# Patient Record
Sex: Male | Born: 1970 | Race: White | Hispanic: No | Marital: Single
Health system: Southern US, Community
[De-identification: ages and names within clinical notes are randomized; demographics above are authoritative.]

## PROBLEM LIST (undated history)

## (undated) DIAGNOSIS — J9621 Acute and chronic respiratory failure with hypoxia: Secondary | ICD-10-CM

## (undated) DIAGNOSIS — I2699 Other pulmonary embolism without acute cor pulmonale: Secondary | ICD-10-CM

## (undated) DIAGNOSIS — J189 Pneumonia, unspecified organism: Secondary | ICD-10-CM

## (undated) DIAGNOSIS — G373 Acute transverse myelitis in demyelinating disease of central nervous system: Secondary | ICD-10-CM

## (undated) HISTORY — PX: TRACHEOSTOMY: SUR1362

---

## 2018-08-27 ENCOUNTER — Other Ambulatory Visit (HOSPITAL_COMMUNITY): Payer: Self-pay

## 2018-08-27 ENCOUNTER — Inpatient Hospital Stay
Admission: AD | Admit: 2018-08-27 | Discharge: 2018-10-19 | Disposition: A | Discharge status: Other Acute Inpatient Hospital | Payer: 59 | Source: Other Acute Inpatient Hospital | Attending: Internal Medicine | Admitting: Internal Medicine

## 2018-08-27 DIAGNOSIS — Z0000 Encounter for general adult medical examination without abnormal findings: Secondary | ICD-10-CM

## 2018-08-27 DIAGNOSIS — Z931 Gastrostomy status: Secondary | ICD-10-CM

## 2018-08-27 DIAGNOSIS — R6510 Systemic inflammatory response syndrome (SIRS) of non-infectious origin without acute organ dysfunction: Secondary | ICD-10-CM

## 2018-08-27 DIAGNOSIS — J9690 Respiratory failure, unspecified, unspecified whether with hypoxia or hypercapnia: Secondary | ICD-10-CM

## 2018-08-27 DIAGNOSIS — J9621 Acute and chronic respiratory failure with hypoxia: Secondary | ICD-10-CM | POA: Diagnosis present

## 2018-08-27 DIAGNOSIS — Z4659 Encounter for fitting and adjustment of other gastrointestinal appliance and device: Secondary | ICD-10-CM

## 2018-08-27 DIAGNOSIS — J189 Pneumonia, unspecified organism: Secondary | ICD-10-CM

## 2018-08-27 DIAGNOSIS — R0902 Hypoxemia: Secondary | ICD-10-CM

## 2018-08-27 DIAGNOSIS — M869 Osteomyelitis, unspecified: Secondary | ICD-10-CM

## 2018-08-27 DIAGNOSIS — Z430 Encounter for attention to tracheostomy: Secondary | ICD-10-CM

## 2018-08-27 DIAGNOSIS — I2699 Other pulmonary embolism without acute cor pulmonale: Secondary | ICD-10-CM | POA: Diagnosis present

## 2018-08-27 DIAGNOSIS — G373 Acute transverse myelitis in demyelinating disease of central nervous system: Secondary | ICD-10-CM | POA: Diagnosis present

## 2018-08-27 HISTORY — DX: Acute transverse myelitis in demyelinating disease of central nervous system: G37.3

## 2018-08-27 HISTORY — DX: Acute and chronic respiratory failure with hypoxia: J96.21

## 2018-08-27 HISTORY — DX: Other pulmonary embolism without acute cor pulmonale: I26.99

## 2018-08-27 HISTORY — DX: Pneumonia, unspecified organism: J18.9

## 2018-08-27 LAB — HEPARIN LEVEL (UNFRACTIONATED): Heparin Unfractionated: <0.1 IU/mL — ABNORMAL LOW (ref 0.30–0.70)

## 2018-08-28 LAB — COMPREHENSIVE METABOLIC PANEL
ALT: 58 U/L — ABNORMAL HIGH (ref 0–44)
AST: 37 U/L (ref 15–41)
Albumin: 2.9 g/dL — ABNORMAL LOW (ref 3.5–5.0)
Alkaline Phosphatase: 219 U/L — ABNORMAL HIGH (ref 38–126)
Anion gap: 12 (ref 5–15)
BUN: 18 mg/dL (ref 6–20)
CO2: 33 mmol/L — ABNORMAL HIGH (ref 22–32)
Calcium: 9.7 mg/dL (ref 8.9–10.3)
Chloride: 93 mmol/L — ABNORMAL LOW (ref 98–111)
Creatinine, Ser: 0.52 mg/dL — ABNORMAL LOW (ref 0.61–1.24)
GFR calc Af Amer: >60 mL/min (ref >60)
GFR calc non Af Amer: >60 mL/min (ref >60)
Glucose, Bld: 98 mg/dL (ref 70–99)
Potassium: 4.1 mmol/L (ref 3.5–5.1)
Sodium: 138 mmol/L (ref 135–145)
Total Bilirubin: 0.6 mg/dL (ref 0.3–1.2)
Total Protein: 7.2 g/dL (ref 6.5–8.1)

## 2018-08-28 LAB — PROTIME-INR
INR: 1.1 (ref 0.8–1.2)
Prothrombin Time: 14.4 seconds (ref 11.4–15.2)

## 2018-08-28 LAB — CBC
HCT: 25.8 % — ABNORMAL LOW (ref 39.0–52.0)
Hemoglobin: 7.7 g/dL — ABNORMAL LOW (ref 13.0–17.0)
MCH: 27.2 pg (ref 26.0–34.0)
MCHC: 29.8 g/dL — ABNORMAL LOW (ref 30.0–36.0)
MCV: 91.2 fL (ref 80.0–100.0)
Platelets: 339 10*3/uL (ref 150–400)
RBC: 2.83 MIL/uL — ABNORMAL LOW (ref 4.22–5.81)
RDW: 16.4 % — ABNORMAL HIGH (ref 11.5–15.5)
WBC: 12.4 10*3/uL — ABNORMAL HIGH (ref 4.0–10.5)
nRBC: 0 % (ref 0.0–0.2)

## 2018-08-28 LAB — HEPARIN LEVEL (UNFRACTIONATED)
Heparin Unfractionated: <0.1 IU/mL — ABNORMAL LOW (ref 0.30–0.70)
Heparin Unfractionated: <0.1 IU/mL — ABNORMAL LOW (ref 0.30–0.70)
Heparin Unfractionated: 0.42 IU/mL (ref 0.30–0.70)

## 2018-08-29 LAB — HEPARIN LEVEL (UNFRACTIONATED)
Heparin Unfractionated: 0.2 IU/mL — ABNORMAL LOW (ref 0.30–0.70)
Heparin Unfractionated: 0.17 IU/mL — ABNORMAL LOW (ref 0.30–0.70)
Heparin Unfractionated: <0.1 IU/mL — ABNORMAL LOW (ref 0.30–0.70)

## 2018-08-29 LAB — PROTIME-INR
INR: 1 (ref 0.8–1.2)
Prothrombin Time: 13.5 seconds (ref 11.4–15.2)

## 2018-08-30 DIAGNOSIS — J189 Pneumonia, unspecified organism: Secondary | ICD-10-CM

## 2018-08-30 DIAGNOSIS — I2699 Other pulmonary embolism without acute cor pulmonale: Secondary | ICD-10-CM | POA: Diagnosis not present

## 2018-08-30 DIAGNOSIS — G373 Acute transverse myelitis in demyelinating disease of central nervous system: Secondary | ICD-10-CM

## 2018-08-30 DIAGNOSIS — J9621 Acute and chronic respiratory failure with hypoxia: Secondary | ICD-10-CM

## 2018-08-30 DIAGNOSIS — Z430 Encounter for attention to tracheostomy: Secondary | ICD-10-CM

## 2018-08-30 LAB — PROTIME-INR
INR: 1.2 (ref 0.8–1.2)
Prothrombin Time: 15.4 seconds — ABNORMAL HIGH (ref 11.4–15.2)

## 2018-08-30 MED ORDER — CHLOROPHYLL EX
10.00 | CUTANEOUS | Status: DC
Start: ? — End: 2018-08-30

## 2018-08-30 MED ORDER — Medication
1.00 | Status: DC
Start: 2018-08-28 — End: 2018-08-30

## 2018-08-30 MED ORDER — OXYCODONE HCL 5 MG PO TABS
10.00 | ORAL_TABLET | ORAL | Status: DC
Start: 2018-08-27 — End: 2018-08-30

## 2018-08-30 MED ORDER — IPECAC PO
5.00 | ORAL | Status: DC
Start: ? — End: 2018-08-30

## 2018-08-30 MED ORDER — RA PAIN RELIEVER PM PO
ORAL | Status: DC
Start: 2018-08-27 — End: 2018-08-30

## 2018-08-30 MED ORDER — PHENYLEPHRINE-GUAIFENESIN 20-375 MG PO CP12
20.00 | ORAL_CAPSULE | ORAL | Status: DC
Start: ? — End: 2018-08-30

## 2018-08-30 MED ORDER — ALBA-3 EX
400.00 | CUTANEOUS | Status: DC
Start: 2018-08-27 — End: 2018-08-30

## 2018-08-30 MED ORDER — BARIJODEEL PO
20.00 | ORAL | Status: DC
Start: 2018-08-28 — End: 2018-08-30

## 2018-08-30 MED ORDER — BISACODYL 10 MG RE SUPP
10.00 | RECTAL | Status: DC
Start: ? — End: 2018-08-30

## 2018-08-30 MED ORDER — FML OP
1000.00 | OPHTHALMIC | Status: DC
Start: 2018-08-28 — End: 2018-08-30

## 2018-08-30 MED ORDER — MYLANTA ULTRA 700-300 MG PO CHEW
3.00 | CHEWABLE_TABLET | ORAL | Status: DC
Start: 2018-08-27 — End: 2018-08-30

## 2018-08-30 MED ORDER — GENERIC EXTERNAL MEDICATION
Status: DC
Start: ? — End: 2018-08-30

## 2018-08-30 MED ORDER — CHLORHEXIDINE GLUCONATE 0.12 % MT SOLN
15.00 | OROMUCOSAL | Status: DC
Start: ? — End: 2018-08-30

## 2018-08-30 MED ORDER — CVS BATH OIL EX
0.50 | CUTANEOUS | Status: DC
Start: ? — End: 2018-08-30

## 2018-08-30 MED ORDER — CALCIUM CARBONATE ANTACID PO
2.00 | ORAL | Status: DC
Start: 2018-08-27 — End: 2018-08-30

## 2018-08-30 MED ORDER — EQUATE NICOTINE 4 MG MT GUM
4.00 | CHEWING_GUM | OROMUCOSAL | Status: DC
Start: ? — End: 2018-08-30

## 2018-08-30 MED ORDER — BARIJODEEL PO
20.00 | ORAL | Status: DC
Start: ? — End: 2018-08-30

## 2018-08-30 MED ORDER — ACCU-PRO PUMP SET/VENT MISC
2.50 | Status: DC
Start: ? — End: 2018-08-30

## 2018-08-30 MED ORDER — TRIAMINIC COUGH/SORE THROAT PO
15.00 | ORAL | Status: DC
Start: 2018-08-27 — End: 2018-08-30

## 2018-08-30 MED ORDER — PCCA BIOPEPTIDE BASE EX CREA
1200.00 | TOPICAL_CREAM | CUTANEOUS | Status: DC
Start: 2018-08-27 — End: 2018-08-30

## 2018-08-30 MED ORDER — MSUD COOLER PO
10.00 | ORAL | Status: DC
Start: 2018-08-28 — End: 2018-08-30

## 2018-08-30 MED ORDER — Medication
15.00 | Status: DC
Start: 2018-08-27 — End: 2018-08-30

## 2018-08-30 MED ORDER — PRO HERBS ENERGY PO TABS
20.00 | ORAL_TABLET | ORAL | Status: DC
Start: 2018-08-27 — End: 2018-08-30

## 2018-08-30 MED ORDER — OLANZAPINE-FLUOXETINE HCL 6-50 MG PO CAPS
6.00 | ORAL_CAPSULE | ORAL | Status: DC
Start: 2018-08-27 — End: 2018-08-30

## 2018-08-30 MED ORDER — ACETAMINOPHEN 325 MG PO TABS
650.00 | ORAL_TABLET | ORAL | Status: DC
Start: ? — End: 2018-08-30

## 2018-08-30 MED ORDER — Medication
3.00 | Status: DC
Start: 2018-08-27 — End: 2018-08-30

## 2018-08-30 NOTE — Consult Note (Signed)
Pulmonary [place]  PULMONARY SERVICE  Date of Service: 08/30/2018  PULMONARY CRITICAL CARE Benjamin Montoya  EVO:[id number]  DOB: 28-Mar-1970   DOA: 08/27/2018  Referring Physician: Merton Border, MD  HPI: Benjamin Montoya is a 48 y.o. male seen for follow up of Acute on Chronic Respiratory Failure.  Patient was apparently admitted with transverse myelitis was started on IVIG and also given steroids.  Will complications with development of pulmonary embolism and respiratory failure.  Patient had increased PCO2 was intubated however had to end up then on ECMO.  Patient also did develop volume associated healthcare associated pneumonia was probably due to aspiration.  He was treated aggressively with antibiotics and had been on the ventilator and not able to come off of the ventilator so therefore required prolonged mechanical ventilation and transferred to our facility for further management and weaning.  Review of Systems:  ROS performed and is unremarkable other than noted above.  Past medical history: Respiratory failure Pulmonary embolism Pneumonia Transverse myelitis  Past surgical history: Tracheostomy PEG tube  Socioeconomic History  . Marital status: Single  Spouse name: None  . Number of children: None  . Years of education: None  . Highest education level: None  Occupational History  . None  Social Needs  . Financial resource strain: None  . Food insecurity  Worry: None  Inability: None  . Transportation needs  Medical: None  Non-medical: None  Tobacco Use  . Smoking status: Current Every Day Smoker  Packs/day: 0.50  Years: 20.00  Pack years: 10.00  Types: Cigarettes  . Smokeless tobacco: Never Used  Substance and Sexual Activity  . Alcohol use: Not Currently  . Drug use: Yes  Types: Marijuana  . Sexual activity: None  Lifestyle  . Physical activity  Days per week: None  Minutes per session: None  .  Stress: None  Relationships  . Social Medical illustrator on phone: None  Gets together: None  Attends religious service: None  Active member of club or organization: None  Attends meetings of clubs or organizations: None  Relationship status: None  Other Topics Concern  . None  Social History Narrative  . None      Medications: Reviewed on Rounds  Physical Exam:  Vitals: Temperature 98.1 pulse 89 respiratory rate 25 blood pressure 130/67 saturations 97%  Ventilator Settings mode ventilation pressure support FiO2 45% tidal volume 683 pressure support 12 PEEP 8  . General: Comfortable at this time . Eyes: Grossly normal lids, irises & conjunctiva . ENT: grossly tongue is normal . Neck: no obvious mass . Cardiovascular: S1-S2 normal no gallop or rub . Respiratory: No rhonchi or rales . Abdomen: Soft and nontender . Skin: no rash seen on limited exam . Musculoskeletal: not rigid . Psychiatric:unable to assess . Neurologic: no seizure no involuntary movements         Labs on Admission:  Basic Metabolic Panel: Recent Labs  Lab 08/28/18 0232  NA 138  K 4.1  CL 93*  CO2 33*  GLUCOSE 98  BUN 18  CREATININE 0.52*  CALCIUM 9.7    No results for input(s): PHART, PCO2ART, PO2ART, HCO3, O2SAT in the last 168 hours.  Liver Function Tests: Recent Labs  Lab 08/28/18 0232  AST 37  ALT 58*  ALKPHOS 219*  BILITOT 0.6  PROT 7.2  ALBUMIN 2.9*   No results for input(s): LIPASE, AMYLASE in the last 168 hours. No results for input(s): AMMONIA in  the last 168 hours.  CBC: Recent Labs  Lab 08/28/18 0232  WBC 12.4*  HGB 7.7*  HCT 25.8*  MCV 91.2  PLT 339    Cardiac Enzymes: No results for input(s): CKTOTAL, CKMB, CKMBINDEX, TROPONINI in the last 168 hours.  BNP (last 3 results) No results for input(s): BNP in the last 8760 hours.  ProBNP (last 3 results) No results for input(s): PROBNP in the last 8760 hours.   Radiological Exams on Admission: Dg Chest  Port 1 View  Result Date: 08/27/2018 CLINICAL DATA:  Tube placement EXAM: PORTABLE CHEST 1 VIEW COMPARISON:  None. FINDINGS: The tracheostomy tube terminates above the carina. The right-sided PICC line tip terminates over the SVC. The enteric tube extends below the left hemidiaphragm. The cardiac silhouette is borderline enlarged. There are small to moderate-sized bilateral pleural effusions. There is a dense retrocardiac opacity. There is no evidence of a pneumothorax. IMPRESSION: 1. Lines and tubes as above. 2. Small to moderate-sized bilateral pleural effusions. 3. Dense retrocardiac opacity which may represent atelectasis or infiltrate. Electronically Signed   By: Katherine Mantlehristopher  Green M.D.   On: 08/27/2018 22:16   Dg Abd Portable 1v  Result Date: 08/27/2018 CLINICAL DATA:  Encounter for nasogastric tube placement. EXAM: PORTABLE ABDOMEN - 1 VIEW COMPARISON:  None. FINDINGS: Nasogastric tube or feeding tube is been inserted, and has passed through the stomach, with the tip near the ligament of Treitz. IMPRESSION: Satisfactory nasogastric tube placement. Electronically Signed   By: Elsie StainJohn T Curnes M.D.   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 Acute on chronic respiratory failure with hypoxia we will continue with full support on pressure support at this time.  The goal is for 4-hour wean on the pressure support.  Patient looks very good should be able to actually do more than 4 hours if he is able to tolerate 2. Pulmonary embolism treated with anticoagulation continue with supportive care follow along. 3. Healthcare associated pneumonia likely was due to aspiration patient has been treated with antibiotics we will continue with supportive care. 4. Transverse myelitis status  post IVIG we will continue to monitor  I have personally seen and evaluated the patient, evaluated laboratory and imaging results, formulated the assessment and plan and placed orders. The Patient requires high complexity decision making for assessment and support.  Case was discussed on Rounds with the Respiratory Therapy Staff Time Spent 70minutes  Yevonne PaxSaadat A , MD [facility] Pulmonary Critical Care Medicine Sleep Medicine

## 2018-08-31 ENCOUNTER — Other Ambulatory Visit (HOSPITAL_COMMUNITY): Payer: Self-pay

## 2018-08-31 DIAGNOSIS — J9621 Acute and chronic respiratory failure with hypoxia: Secondary | ICD-10-CM | POA: Diagnosis not present

## 2018-08-31 DIAGNOSIS — G373 Acute transverse myelitis in demyelinating disease of central nervous system: Secondary | ICD-10-CM | POA: Diagnosis not present

## 2018-08-31 DIAGNOSIS — J189 Pneumonia, unspecified organism: Secondary | ICD-10-CM | POA: Diagnosis not present

## 2018-08-31 DIAGNOSIS — I2699 Other pulmonary embolism without acute cor pulmonale: Secondary | ICD-10-CM | POA: Diagnosis not present

## 2018-08-31 LAB — CBC
HCT: 26.9 % — ABNORMAL LOW (ref 39.0–52.0)
Hemoglobin: 8.1 g/dL — ABNORMAL LOW (ref 13.0–17.0)
MCH: 27.2 pg (ref 26.0–34.0)
MCHC: 30.1 g/dL (ref 30.0–36.0)
MCV: 90.3 fL (ref 80.0–100.0)
Platelets: 370 10*3/uL (ref 150–400)
RBC: 2.98 MIL/uL — ABNORMAL LOW (ref 4.22–5.81)
RDW: 16.1 % — ABNORMAL HIGH (ref 11.5–15.5)
WBC: 9.1 10*3/uL (ref 4.0–10.5)
nRBC: 0 % (ref 0.0–0.2)

## 2018-08-31 LAB — BASIC METABOLIC PANEL
Anion gap: 12 (ref 5–15)
BUN: 14 mg/dL (ref 6–20)
CO2: 29 mmol/L (ref 22–32)
Calcium: 10 mg/dL (ref 8.9–10.3)
Chloride: 96 mmol/L — ABNORMAL LOW (ref 98–111)
Creatinine, Ser: 0.39 mg/dL — ABNORMAL LOW (ref 0.61–1.24)
GFR calc Af Amer: >60 mL/min (ref >60)
GFR calc non Af Amer: >60 mL/min (ref >60)
Glucose, Bld: 98 mg/dL (ref 70–99)
Potassium: 4.1 mmol/L (ref 3.5–5.1)
Sodium: 137 mmol/L (ref 135–145)

## 2018-08-31 LAB — PROTIME-INR
INR: 1.3 — ABNORMAL HIGH (ref 0.8–1.2)
Prothrombin Time: 15.7 seconds — ABNORMAL HIGH (ref 11.4–15.2)

## 2018-08-31 NOTE — Progress Notes (Signed)
Pulmonary Critical Care Medicine [place]   PULMONARY CRITICAL CARE SERVICE  PROGRESS NOTE  Date of Service: 08/31/2018  Benjamin Montoya  ASN:[id number]  DOB: [birth year]   DOA: 08/27/2018  Referring Physician: Merton Border, MD  HPI: Benjamin Montoya is a 48 y.o. male seen for follow up of Acute on Chronic Respiratory Failure.  Currently patient is on pressure support mode has been on 40% FiO2 the goal today is for about 8 hours  Medications: Reviewed on Rounds  Physical Exam:  Vitals: Temperature 99.1 pulse 70 respiratory rate 20 blood pressure 100/53 saturations 95%  Ventilator Settings mode ventilation pressure support FiO2 40% pressure 12 PEEP 5  . General: Comfortable at this time . Eyes: Grossly normal lids, irises & conjunctiva . ENT: grossly tongue is normal . Neck: no obvious mass . Cardiovascular: S1 S2 normal no gallop . Respiratory: No rhonchi or rales are noted at this time . Abdomen: soft . Skin: no rash seen on limited exam . Musculoskeletal: not rigid . Psychiatric:unable to assess . Neurologic: no seizure no involuntary movements         Lab Data:   Basic Metabolic Panel: Recent Labs  Lab 08/28/18 0232 08/31/18 0503  NA 138 137  K 4.1 4.1  CL 93* 96*  CO2 33* 29  GLUCOSE 98 98  BUN 18 14  CREATININE 0.52* 0.39*  CALCIUM 9.7 10.0    ABG: No results for input(s): PHART, PCO2ART, PO2ART, HCO3, O2SAT in the last 168 hours.  Liver Function Tests: Recent Labs  Lab 08/28/18 0232  AST 37  ALT 58*  ALKPHOS 219*  BILITOT 0.6  PROT 7.2  ALBUMIN 2.9*   No results for input(s): LIPASE, AMYLASE in the last 168 hours. No results for input(s): AMMONIA in the last 168 hours.  CBC: Recent Labs  Lab 08/28/18 0232 08/31/18 0503  WBC 12.4* 9.1  HGB 7.7* 8.1*  HCT 25.8* 26.9*  MCV 91.2 90.3  PLT 339 370    Cardiac Enzymes: No results for input(s): CKTOTAL, CKMB, CKMBINDEX, TROPONINI in the last 168 hours.  BNP (last 3  results) No results for input(s): BNP in the last 8760 hours.  ProBNP (last 3 results) No results for input(s): PROBNP in the last 8760 hours.  Radiological Exams: No results found.  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 Acute on chronic respiratory failure with hypoxia we will continue with pressure support titrate oxygen down as tolerated the goal is for 8 hours we will continue to advance as per the protocol 2. Acute transverse myelitis we will continue with therapy as tolerated patient prognosis guarded unknown etiology 3. Healthcare associated pneumonia treated we will continue with supportive care follow radiologically 4. Pulmonary embolism continue with present management.   I have personally seen and evaluated the patient, evaluated laboratory and imaging results, formulated the assessment and plan and placed orders. The Patient requires high complexity decision making for assessment and support.  Case was discussed on Rounds with the Respiratory Therapy Staff  Allyne Gee, MD [facility] Pulmonary Critical Care Medicine Sleep Medicine

## 2018-09-01 DIAGNOSIS — I2699 Other pulmonary embolism without acute cor pulmonale: Secondary | ICD-10-CM | POA: Diagnosis not present

## 2018-09-01 DIAGNOSIS — J9621 Acute and chronic respiratory failure with hypoxia: Secondary | ICD-10-CM | POA: Diagnosis not present

## 2018-09-01 DIAGNOSIS — J189 Pneumonia, unspecified organism: Secondary | ICD-10-CM | POA: Diagnosis not present

## 2018-09-01 DIAGNOSIS — G373 Acute transverse myelitis in demyelinating disease of central nervous system: Secondary | ICD-10-CM | POA: Diagnosis not present

## 2018-09-01 LAB — PROTIME-INR
INR: 1.4 — ABNORMAL HIGH (ref 0.8–1.2)
Prothrombin Time: 17 seconds — ABNORMAL HIGH (ref 11.4–15.2)

## 2018-09-01 NOTE — Progress Notes (Addendum)
Pulmonary Critical Care Medicine [place]   PULMONARY CRITICAL CARE SERVICE  PROGRESS NOTE  Date of Service: 09/01/2018  Benjamin Montoya  AYT:[id number]  DOB: Jan 07, 1971   DOA: 08/27/2018  Referring Physician: Merton Border, MD  HPI: Benjamin Montoya is a 48 y.o. male seen for follow up of Acute on Chronic Respiratory Failure.  Patient at goal of 12 hours today on pressure support 12 over 5 with an FiO2 of 35%.    Medications: Reviewed on Rounds  Physical Exam:  Vitals: Pulse 76 respirations 20 BP 106/61 O2 sat 92% temp 98.8  Ventilator Settings pressure support 12/5 FiO2 35%  . General: Comfortable at this time . Eyes: Grossly normal lids, irises & conjunctiva . ENT: grossly tongue is normal . Neck: no obvious mass . Cardiovascular: S1 S2 normal no gallop . Respiratory: No rales or rhonchi noted . Abdomen: soft . Skin: no rash seen on limited exam . Musculoskeletal: not rigid . Psychiatric:unable to assess . Neurologic: no seizure no involuntary movements         Lab Data:   Basic Metabolic Panel: Recent Labs  Lab 08/28/18 0232 08/31/18 0503  NA 138 137  K 4.1 4.1  CL 93* 96*  CO2 33* 29  GLUCOSE 98 98  BUN 18 14  CREATININE 0.52* 0.39*  CALCIUM 9.7 10.0    ABG: No results for input(s): PHART, PCO2ART, PO2ART, HCO3, O2SAT in the last 168 hours.  Liver Function Tests: Recent Labs  Lab 08/28/18 0232  AST 37  ALT 58*  ALKPHOS 219*  BILITOT 0.6  PROT 7.2  ALBUMIN 2.9*   No results for input(s): LIPASE, AMYLASE in the last 168 hours. No results for input(s): AMMONIA in the last 168 hours.  CBC: Recent Labs  Lab 08/28/18 0232 08/31/18 0503  WBC 12.4* 9.1  HGB 7.7* 8.1*  HCT 25.8* 26.9*  MCV 91.2 90.3  PLT 339 370    Cardiac Enzymes: No results for input(s): CKTOTAL, CKMB, CKMBINDEX, TROPONINI in the last 168 hours.  BNP (last 3 results) No results for input(s): BNP in the last 8760 hours.  ProBNP (last 3 results) No  results for input(s): PROBNP in the last 8760 hours.  Radiological Exams: Dg Chest Port 1 View  Result Date: 08/31/2018 CLINICAL DATA:  Pneumonia. EXAM: PORTABLE CHEST 1 VIEW COMPARISON:  08/27/2018 FINDINGS: The tracheostomy tube is stable. The right PICC line is also stable. There is a feeding tube coursing down the esophagus and into the stomach. Persistent bilateral pleural effusions and bibasilar infiltrates, right greater than left. The cardiac silhouette, mediastinal and hilar contours are unchanged. Mild vascular congestion. IMPRESSION: 1. Stable support apparatus. 2. Persistent bilateral effusions and bibasilar infiltrates. Electronically Signed   By: Marijo Sanes M.D.   On: 08/31/2018 09:58    Assessment/Plan Active Problems:   * No active hospital problems. *   1. Acute on chronic respiratory failure with hypoxia we will continue with pressure support titrate oxygen down as tolerated the goal is for 12 hours we will continue to advance as per the protocol 2. Acute transverse myelitis we will continue with therapy as tolerated patient prognosis guarded unknown etiology 3. Healthcare associated pneumonia treated we will continue with supportive care follow radiologically 4. Pulmonary embolism continue with present management.   I have personally seen and evaluated the patient, evaluated laboratory and imaging results, formulated the assessment and plan and placed orders. The Patient requires high complexity decision making for assessment and support.  Case was  discussed on Rounds with the Respiratory Therapy Staff  Allyne Gee, MD [facility] Pulmonary Critical Care Medicine Sleep Medicine

## 2018-09-02 ENCOUNTER — Encounter: Payer: Self-pay | Admitting: Internal Medicine

## 2018-09-02 DIAGNOSIS — J9621 Acute and chronic respiratory failure with hypoxia: Secondary | ICD-10-CM | POA: Diagnosis not present

## 2018-09-02 DIAGNOSIS — G373 Acute transverse myelitis in demyelinating disease of central nervous system: Secondary | ICD-10-CM | POA: Diagnosis not present

## 2018-09-02 DIAGNOSIS — I2699 Other pulmonary embolism without acute cor pulmonale: Secondary | ICD-10-CM | POA: Diagnosis not present

## 2018-09-02 DIAGNOSIS — J189 Pneumonia, unspecified organism: Secondary | ICD-10-CM | POA: Diagnosis present

## 2018-09-02 LAB — PROTIME-INR
INR: 1.6 — ABNORMAL HIGH (ref 0.8–1.2)
Prothrombin Time: 18.9 seconds — ABNORMAL HIGH (ref 11.4–15.2)

## 2018-09-02 NOTE — Progress Notes (Signed)
Pulmonary Critical Care Medicine [place]   PULMONARY CRITICAL CARE SERVICE  PROGRESS NOTE  Date of Service: 09/02/2018  Benjamin Montoya  YTK:[id number]  DOB: 1970-06-29   DOA: 08/27/2018  Referring Physician: Merton Border, MD  HPI: Benjamin Montoya is a 48 y.o. male seen for follow up of Acute on Chronic Respiratory Failure.  Patient currently is on pressure support mode did 12 hours yesterday the goal is to do about 16 hours today.  Medications: Reviewed on Rounds  Physical Exam:  Vitals: Temperature 98.2 pulse 80 respiratory 18 blood pressure 140 15/60 saturations 96%  Ventilator Settings mode ventilation pressure support FiO2 30% pressure support 12 PEEP 5  . General: Comfortable at this time . Eyes: Grossly normal lids, irises & conjunctiva . ENT: grossly tongue is normal . Neck: no obvious mass . Cardiovascular: S1 S2 normal no gallop . Respiratory: No rhonchi coarse breath sounds . Abdomen: soft . Skin: no rash seen on limited exam . Musculoskeletal: not rigid . Psychiatric:unable to assess . Neurologic: no seizure no involuntary movements         Lab Data:   Basic Metabolic Panel: Recent Labs  Lab 08/28/18 0232 08/31/18 0503  NA 138 137  K 4.1 4.1  CL 93* 96*  CO2 33* 29  GLUCOSE 98 98  BUN 18 14  CREATININE 0.52* 0.39*  CALCIUM 9.7 10.0    ABG: No results for input(s): PHART, PCO2ART, PO2ART, HCO3, O2SAT in the last 168 hours.  Liver Function Tests: Recent Labs  Lab 08/28/18 0232  AST 37  ALT 58*  ALKPHOS 219*  BILITOT 0.6  PROT 7.2  ALBUMIN 2.9*   No results for input(s): LIPASE, AMYLASE in the last 168 hours. No results for input(s): AMMONIA in the last 168 hours.  CBC: Recent Labs  Lab 08/28/18 0232 08/31/18 0503  WBC 12.4* 9.1  HGB 7.7* 8.1*  HCT 25.8* 26.9*  MCV 91.2 90.3  PLT 339 370    Cardiac Enzymes: No results for input(s): CKTOTAL, CKMB, CKMBINDEX, TROPONINI in the last 168 hours.  BNP (last 3  results) No results for input(s): BNP in the last 8760 hours.  ProBNP (last 3 results) No results for input(s): PROBNP in the last 8760 hours.  Radiological Exams: No results found.  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 Acute on chronic respiratory failure with hypoxia we will continue to wean on pressure support patient is able to complete 12 hours goal of 16 hours today continue to advance 2. Acute transverse myelitis patient is at baseline continue with supportive care 3. Healthcare associated pneumonia treated clinically improving we will continue to monitor. 4. Pulmonary embolism at baseline continue with supportive care   I have personally seen and evaluated the patient, evaluated laboratory and imaging results, formulated the assessment and plan and placed orders. The Patient requires high complexity decision making for assessment and support.  Case was discussed on Rounds with the Respiratory Therapy Staff  Allyne Gee, MD [facility] Pulmonary Critical Care Medicine Sleep Medicine

## 2018-09-03 DIAGNOSIS — I2699 Other pulmonary embolism without acute cor pulmonale: Secondary | ICD-10-CM | POA: Diagnosis not present

## 2018-09-03 DIAGNOSIS — J9621 Acute and chronic respiratory failure with hypoxia: Secondary | ICD-10-CM | POA: Diagnosis not present

## 2018-09-03 DIAGNOSIS — G373 Acute transverse myelitis in demyelinating disease of central nervous system: Secondary | ICD-10-CM | POA: Diagnosis not present

## 2018-09-03 DIAGNOSIS — J189 Pneumonia, unspecified organism: Secondary | ICD-10-CM | POA: Diagnosis not present

## 2018-09-03 LAB — PROTIME-INR
INR: 1.6 — ABNORMAL HIGH (ref 0.8–1.2)
Prothrombin Time: 18.9 seconds — ABNORMAL HIGH (ref 11.4–15.2)

## 2018-09-03 NOTE — Progress Notes (Addendum)
Pulmonary Critical Care Medicine [place]   PULMONARY CRITICAL CARE SERVICE  PROGRESS NOTE  Date of Service: 09/03/2018  Quincy Boy  EPP:[id number]  DOB: 11-11-1970   DOA: 08/27/2018  Referring Physician: Merton Border, MD  HPI: Kolin Erdahl is a 48 y.o. male seen for follow up of Acute on Chronic Respiratory Failure.  Patient has a pressure support goal of 20 hours today on 35% FiO2.  Satting well at this time with no distress.  Medications: Reviewed on Rounds  Physical Exam:  Vitals: Pulse 66 respirations 20 BP 111/59 O2 sat 95% temp 98.9  Ventilator Settings pressure support 12/5 FiO2 of 35%  . General: Comfortable at this time . Eyes: Grossly normal lids, irises & conjunctiva . ENT: grossly tongue is normal . Neck: no obvious mass . Cardiovascular: S1 S2 normal no gallop . Respiratory: No rales or rhonchi noted . Abdomen: soft . Skin: no rash seen on limited exam . Musculoskeletal: not rigid . Psychiatric:unable to assess . Neurologic: no seizure no involuntary movements         Lab Data:   Basic Metabolic Panel: Recent Labs  Lab 08/28/18 0232 08/31/18 0503  NA 138 137  K 4.1 4.1  CL 93* 96*  CO2 33* 29  GLUCOSE 98 98  BUN 18 14  CREATININE 0.52* 0.39*  CALCIUM 9.7 10.0    ABG: No results for input(s): PHART, PCO2ART, PO2ART, HCO3, O2SAT in the last 168 hours.  Liver Function Tests: Recent Labs  Lab 08/28/18 0232  AST 37  ALT 58*  ALKPHOS 219*  BILITOT 0.6  PROT 7.2  ALBUMIN 2.9*   No results for input(s): LIPASE, AMYLASE in the last 168 hours. No results for input(s): AMMONIA in the last 168 hours.  CBC: Recent Labs  Lab 08/28/18 0232 08/31/18 0503  WBC 12.4* 9.1  HGB 7.7* 8.1*  HCT 25.8* 26.9*  MCV 91.2 90.3  PLT 339 370    Cardiac Enzymes: No results for input(s): CKTOTAL, CKMB, CKMBINDEX, TROPONINI in the last 168 hours.  BNP (last 3 results) No results for input(s): BNP in the last 8760  hours.  ProBNP (last 3 results) No results for input(s): PROBNP in the last 8760 hours.  Radiological Exams: No results found.  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 Acute on respiratory failure with hypoxia continue to wean on pressure support with a goal of 20 hours at this time.  Continue supportive measures and pulmonary toilet. 2. Acute transverse myelitis patient at baseline continue supportive care 3. Healthcare associated pneumonia treated clinically improving continue to monitor 4. Pulmonary embolism at baseline continue supportive care   I have personally seen and evaluated the patient, evaluated laboratory and imaging results, formulated the assessment and plan and placed orders. The Patient requires high complexity decision making for assessment and support.  Case was discussed on Rounds with the Respiratory Therapy Staff  Allyne Gee, MD [facility] Pulmonary Critical Care Medicine Sleep Medicine

## 2018-09-04 DIAGNOSIS — G373 Acute transverse myelitis in demyelinating disease of central nervous system: Secondary | ICD-10-CM | POA: Diagnosis not present

## 2018-09-04 DIAGNOSIS — J189 Pneumonia, unspecified organism: Secondary | ICD-10-CM | POA: Diagnosis not present

## 2018-09-04 DIAGNOSIS — I2699 Other pulmonary embolism without acute cor pulmonale: Secondary | ICD-10-CM | POA: Diagnosis not present

## 2018-09-04 DIAGNOSIS — J9621 Acute and chronic respiratory failure with hypoxia: Secondary | ICD-10-CM | POA: Diagnosis not present

## 2018-09-04 LAB — PROTIME-INR
INR: 1.9 — ABNORMAL HIGH (ref 0.8–1.2)
Prothrombin Time: 21.1 seconds — ABNORMAL HIGH (ref 11.4–15.2)

## 2018-09-04 NOTE — Progress Notes (Addendum)
Pulmonary Critical Care Medicine [place]   PULMONARY CRITICAL CARE SERVICE  PROGRESS NOTE  Date of Service: 09/04/2018  Benjamin Montoya  CVE:[id number]  DOB: 1970/11/19   DOA: 08/27/2018  Referring Physician: Merton Border, MD  HPI: Benjamin Montoya is a 48 y.o. male seen for follow up of Acute on Chronic Respiratory Failure.  Patient was able to 2 hours on aerosol trach collar today and is now having 14 hours on pressure support 12/5 with FiO2 of 35%.  Satting well with no distress.  Medications: Reviewed on Rounds  Physical Exam:  Vitals: Pulse 9 respirations 22 BP 109/78 O2 sat 95% temp 97.9  Ventilator Settings pressure support 12/5 FiO2 35%  . General: Comfortable at this time . Eyes: Grossly normal lids, irises & conjunctiva . ENT: grossly tongue is normal . Neck: no obvious mass . Cardiovascular: S1 S2 normal no gallop . Respiratory: No rales or rhonchi noted . Abdomen: soft . Skin: no rash seen on limited exam . Musculoskeletal: not rigid . Psychiatric:unable to assess . Neurologic: no seizure no involuntary movements         Lab Data:   Basic Metabolic Panel: Recent Labs  Lab 08/31/18 0503  NA 137  K 4.1  CL 96*  CO2 29  GLUCOSE 98  BUN 14  CREATININE 0.39*  CALCIUM 10.0    ABG: No results for input(s): PHART, PCO2ART, PO2ART, HCO3, O2SAT in the last 168 hours.  Liver Function Tests: No results for input(s): AST, ALT, ALKPHOS, BILITOT, PROT, ALBUMIN in the last 168 hours. No results for input(s): LIPASE, AMYLASE in the last 168 hours. No results for input(s): AMMONIA in the last 168 hours.  CBC: Recent Labs  Lab 08/31/18 0503  WBC 9.1  HGB 8.1*  HCT 26.9*  MCV 90.3  PLT 370    Cardiac Enzymes: No results for input(s): CKTOTAL, CKMB, CKMBINDEX, TROPONINI in the last 168 hours.  BNP (last 3 results) No results for input(s): BNP in the last 8760 hours.  ProBNP (last 3 results) No results for input(s): PROBNP in the  last 8760 hours.  Radiological Exams: No results found.  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 Acute on respiratory failure with hypoxia continue to wean per protocol on aerosol trach collar with pressure support for a total of 16 hours.  Continue supportive measures and pulmonary toilet. 2. Acute transverse myelitis patient at baseline continue supportive care 3. Healthcare associated pneumonia treated clinically improving continue to monitor 4. Pulmonary embolism at baseline continue supportive care   I have personally seen and evaluated the patient, evaluated laboratory and imaging results, formulated the assessment and plan and placed orders. The Patient requires high complexity decision making for assessment and support.  Case was discussed on Rounds with the Respiratory Therapy Staff  Allyne Gee, MD [facility] Pulmonary Critical Care Medicine Sleep Medicine

## 2018-09-05 DIAGNOSIS — I2699 Other pulmonary embolism without acute cor pulmonale: Secondary | ICD-10-CM | POA: Diagnosis not present

## 2018-09-05 DIAGNOSIS — J189 Pneumonia, unspecified organism: Secondary | ICD-10-CM | POA: Diagnosis not present

## 2018-09-05 DIAGNOSIS — J9621 Acute and chronic respiratory failure with hypoxia: Secondary | ICD-10-CM | POA: Diagnosis not present

## 2018-09-05 DIAGNOSIS — G373 Acute transverse myelitis in demyelinating disease of central nervous system: Secondary | ICD-10-CM | POA: Diagnosis not present

## 2018-09-05 LAB — PROTIME-INR
INR: 2.3 — ABNORMAL HIGH (ref 0.8–1.2)
Prothrombin Time: 24.6 seconds — ABNORMAL HIGH (ref 11.4–15.2)

## 2018-09-05 LAB — VANCOMYCIN, TROUGH: Vancomycin Tr: <4 ug/mL — ABNORMAL LOW (ref 15–20)

## 2018-09-05 NOTE — Progress Notes (Addendum)
Pulmonary Critical Care Medicine [place]   PULMONARY CRITICAL CARE SERVICE  PROGRESS NOTE  Date of Service: 09/05/2018  Benjamin Montoya  OZH:[id number]  DOB: March 24, 1971   DOA: 08/27/2018  Referring Physician: Merton Border, MD  HPI: Benjamin Montoya is a 48 y.o. male seen for follow up of Acute on Chronic Respiratory Failure.  Patient remains on 35% aerosol trach collar today with a 4 to 6-hour goal and then will switch to pressure support for 16 hours total weaning.  Satting well currently with no distress noted.  Medications: Reviewed on Rounds  Physical Exam:  Vitals: Pulse 71 respirations 20 BP 120/63 O2 sat 95% temp 98.2  Ventilator Settings ATC 35%  . General: Comfortable at this time . Eyes: Grossly normal lids, irises & conjunctiva . ENT: grossly tongue is normal . Neck: no obvious mass . Cardiovascular: S1 S2 normal no gallop . Respiratory: No rales or rhonchi noted . Abdomen: soft . Skin: no rash seen on limited exam . Musculoskeletal: not rigid . Psychiatric:unable to assess . Neurologic: no seizure no involuntary movements         Lab Data:   Basic Metabolic Panel: Recent Labs  Lab 08/31/18 0503  NA 137  K 4.1  CL 96*  CO2 29  GLUCOSE 98  BUN 14  CREATININE 0.39*  CALCIUM 10.0    ABG: No results for input(s): PHART, PCO2ART, PO2ART, HCO3, O2SAT in the last 168 hours.  Liver Function Tests: No results for input(s): AST, ALT, ALKPHOS, BILITOT, PROT, ALBUMIN in the last 168 hours. No results for input(s): LIPASE, AMYLASE in the last 168 hours. No results for input(s): AMMONIA in the last 168 hours.  CBC: Recent Labs  Lab 08/31/18 0503  WBC 9.1  HGB 8.1*  HCT 26.9*  MCV 90.3  PLT 370    Cardiac Enzymes: No results for input(s): CKTOTAL, CKMB, CKMBINDEX, TROPONINI in the last 168 hours.  BNP (last 3 results) No results for input(s): BNP in the last 8760 hours.  ProBNP (last 3 results) No results for input(s): PROBNP  in the last 8760 hours.  Radiological Exams: No results found.  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 Acute on respiratory failure with hypoxia continue to wean per protocol on aerosol trach collar 20% FiO2 for 4 to 6 hours and with pressure support for a total of 16 hours.  Continue supportive measures and pulmonary toilet. 2. Acute transverse myelitis patient at baseline continue supportive care 3. Healthcare associated pneumonia treated clinically improving continue to monitor 4. Pulmonary embolism at baseline continue supportive care   I have personally seen and evaluated the patient, evaluated laboratory and imaging results, formulated the assessment and plan and placed orders. The Patient requires high complexity decision making for assessment and support.  Case was discussed on Rounds with the Respiratory Therapy Staff  Allyne Gee, MD [facility] Pulmonary Critical Care Medicine Sleep Medicine

## 2018-09-06 DIAGNOSIS — J9621 Acute and chronic respiratory failure with hypoxia: Secondary | ICD-10-CM | POA: Diagnosis not present

## 2018-09-06 DIAGNOSIS — G373 Acute transverse myelitis in demyelinating disease of central nervous system: Secondary | ICD-10-CM | POA: Diagnosis not present

## 2018-09-06 DIAGNOSIS — J189 Pneumonia, unspecified organism: Secondary | ICD-10-CM | POA: Diagnosis not present

## 2018-09-06 DIAGNOSIS — I2699 Other pulmonary embolism without acute cor pulmonale: Secondary | ICD-10-CM | POA: Diagnosis not present

## 2018-09-06 LAB — PROTIME-INR
INR: 2 — ABNORMAL HIGH (ref 0.8–1.2)
Prothrombin Time: 22.3 seconds — ABNORMAL HIGH (ref 11.4–15.2)

## 2018-09-06 NOTE — Progress Notes (Addendum)
Pulmonary Critical Care Medicine [place]   PULMONARY CRITICAL CARE SERVICE  PROGRESS NOTE  Date of Service: 09/06/2018  Benjamin Montoya  QIH:[id number]  DOB: 07-28-1970   DOA: 08/27/2018  Referring Physician: Merton Border, MD  HPI: Benjamin Montoya is a 48 y.o. male seen for follow up of Acute on Chronic Respiratory Failure.  Patient was only able to tolerate 3 and half hours of aerosol trach collar 35% today and is now back on pressure support.  Patient reports he is not feeling well he remains slightly febrile at this time.  Medications: Reviewed on Rounds  Physical Exam:  Vitals: Pulse 80 respirations 20 BP 134/69 O2 sat 94% temp 100.2  Ventilator Settings pressure 12/5 FiO2 of 35%  . General: Comfortable at this time . Eyes: Grossly normal lids, irises & conjunctiva . ENT: grossly tongue is normal . Neck: no obvious mass . Cardiovascular: S1 S2 normal no gallop . Respiratory: Rales or rhonchi noted . Abdomen: soft . Skin: no rash seen on limited exam . Musculoskeletal: not rigid . Psychiatric:unable to assess . Neurologic: no seizure no involuntary movements         Lab Data:   Basic Metabolic Panel: Recent Labs  Lab 08/31/18 0503  NA 137  K 4.1  CL 96*  CO2 29  GLUCOSE 98  BUN 14  CREATININE 0.39*  CALCIUM 10.0    ABG: No results for input(s): PHART, PCO2ART, PO2ART, HCO3, O2SAT in the last 168 hours.  Liver Function Tests: No results for input(s): AST, ALT, ALKPHOS, BILITOT, PROT, ALBUMIN in the last 168 hours. No results for input(s): LIPASE, AMYLASE in the last 168 hours. No results for input(s): AMMONIA in the last 168 hours.  CBC: Recent Labs  Lab 08/31/18 0503  WBC 9.1  HGB 8.1*  HCT 26.9*  MCV 90.3  PLT 370    Cardiac Enzymes: No results for input(s): CKTOTAL, CKMB, CKMBINDEX, TROPONINI in the last 168 hours.  BNP (last 3 results) No results for input(s): BNP in the last 8760 hours.  ProBNP (last 3 results) No  results for input(s): PROBNP in the last 8760 hours.  Radiological Exams: No results found.  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 Acute on respiratory failure with hypoxia patient resting on pressure support at this time after failing a wean to aerosol trach collar today.  We will continue to attempt weaning again tomorrow after patient rest. 2. Acute transverse myelitis patient at baseline continue supportive care 3. Healthcare associated pneumonia treated clinically improving continue to monitor 4. Pulmonary embolism at baseline continue supportive care   I have personally seen and evaluated the patient, evaluated laboratory and imaging results, formulated the assessment and plan and placed orders. The Patient requires high complexity decision making for assessment and support.  Case was discussed on Rounds with the Respiratory Therapy Staff  Allyne Gee, MD [facility] Pulmonary Critical Care Medicine Sleep Medicine

## 2018-09-07 ENCOUNTER — Other Ambulatory Visit (HOSPITAL_COMMUNITY): Payer: Self-pay

## 2018-09-07 DIAGNOSIS — J9621 Acute and chronic respiratory failure with hypoxia: Secondary | ICD-10-CM | POA: Diagnosis not present

## 2018-09-07 DIAGNOSIS — I2699 Other pulmonary embolism without acute cor pulmonale: Secondary | ICD-10-CM | POA: Diagnosis not present

## 2018-09-07 DIAGNOSIS — J189 Pneumonia, unspecified organism: Secondary | ICD-10-CM | POA: Diagnosis not present

## 2018-09-07 DIAGNOSIS — G373 Acute transverse myelitis in demyelinating disease of central nervous system: Secondary | ICD-10-CM | POA: Diagnosis not present

## 2018-09-07 LAB — BASIC METABOLIC PANEL
Anion gap: 11 (ref 5–15)
BUN: 15 mg/dL (ref 6–20)
CO2: 28 mmol/L (ref 22–32)
Calcium: 9.5 mg/dL (ref 8.9–10.3)
Chloride: 95 mmol/L — ABNORMAL LOW (ref 98–111)
Creatinine, Ser: 0.54 mg/dL — ABNORMAL LOW (ref 0.61–1.24)
GFR calc Af Amer: >60 mL/min (ref >60)
GFR calc non Af Amer: >60 mL/min (ref >60)
Glucose, Bld: 113 mg/dL — ABNORMAL HIGH (ref 70–99)
Potassium: 4.7 mmol/L (ref 3.5–5.1)
Sodium: 134 mmol/L — ABNORMAL LOW (ref 135–145)

## 2018-09-07 LAB — URINALYSIS, ROUTINE W REFLEX MICROSCOPIC
Bacteria, UA: NONE SEEN
Bilirubin Urine: NEGATIVE
Glucose, UA: NEGATIVE mg/dL
Ketones, ur: NEGATIVE mg/dL
Leukocytes,Ua: NEGATIVE
Nitrite: NEGATIVE
Protein, ur: 100 mg/dL — AB
Specific Gravity, Urine: 1.016 (ref 1.005–1.030)
pH: 6 (ref 5.0–8.0)

## 2018-09-07 LAB — CBC
HCT: 32.4 % — ABNORMAL LOW (ref 39.0–52.0)
Hemoglobin: 9.9 g/dL — ABNORMAL LOW (ref 13.0–17.0)
MCH: 26.8 pg (ref 26.0–34.0)
MCHC: 30.6 g/dL (ref 30.0–36.0)
MCV: 87.8 fL (ref 80.0–100.0)
Platelets: 418 10*3/uL — ABNORMAL HIGH (ref 150–400)
RBC: 3.69 MIL/uL — ABNORMAL LOW (ref 4.22–5.81)
RDW: 15.4 % (ref 11.5–15.5)
WBC: 16.2 10*3/uL — ABNORMAL HIGH (ref 4.0–10.5)
nRBC: 0 % (ref 0.0–0.2)

## 2018-09-07 LAB — PROTIME-INR
INR: 2.5 — ABNORMAL HIGH (ref 0.8–1.2)
Prothrombin Time: 26.4 seconds — ABNORMAL HIGH (ref 11.4–15.2)

## 2018-09-07 NOTE — Progress Notes (Addendum)
Pulmonary Critical Care Medicine [place]   PULMONARY CRITICAL CARE SERVICE  PROGRESS NOTE  Date of Service: 09/07/2018  Benjamin Montoya  GQQ:[id number]  DOB: 1970/05/17   DOA: 08/27/2018  Referring Physician: Merton Border, MD  HPI: Benjamin Montoya is a 48 y.o. male seen for follow up of Acute on Chronic Respiratory Failure.  Patient remains on 35% aerosol trach collar satting well with no distress at this time. He has a goal of 8 hours today.    Medications: Reviewed on Rounds  Physical Exam:  Vitals: Pulse 67 respirations 30 BP 159/68 O2 sat 97% temp 97.6  Ventilator Settings ATC 35%%  . General: Comfortable at this time . Eyes: Grossly normal lids, irises & conjunctiva . ENT: grossly tongue is normal . Neck: no obvious mass . Cardiovascular: S1 S2 normal no gallop . Respiratory: No rales or rhonchi noted . Abdomen: soft . Skin: no rash seen on limited exam . Musculoskeletal: not rigid . Psychiatric:unable to assess . Neurologic: no seizure no involuntary movements         Lab Data:   Basic Metabolic Panel: Recent Labs  Lab 09/07/18 0803  NA 134*  K 4.7  CL 95*  CO2 28  GLUCOSE 113*  BUN 15  CREATININE 0.54*  CALCIUM 9.5    ABG: No results for input(s): PHART, PCO2ART, PO2ART, HCO3, O2SAT in the last 168 hours.  Liver Function Tests: No results for input(s): AST, ALT, ALKPHOS, BILITOT, PROT, ALBUMIN in the last 168 hours. No results for input(s): LIPASE, AMYLASE in the last 168 hours. No results for input(s): AMMONIA in the last 168 hours.  CBC: Recent Labs  Lab 09/07/18 0803  WBC 16.2*  HGB 9.9*  HCT 32.4*  MCV 87.8  PLT 418*    Cardiac Enzymes: No results for input(s): CKTOTAL, CKMB, CKMBINDEX, TROPONINI in the last 168 hours.  BNP (last 3 results) No results for input(s): BNP in the last 8760 hours.  ProBNP (last 3 results) No results for input(s): PROBNP in the last 8760 hours.  Radiological Exams: Ct Abdomen Wo  Contrast  Result Date: 09/07/2018 CLINICAL DATA:  Evaluate anatomy prior to potential percutaneous gastrostomy tube placement. EXAM: CT ABDOMEN WITHOUT CONTRAST TECHNIQUE: Multidetector CT imaging of the abdomen was performed following the standard protocol without IV contrast. COMPARISON:  None. FINDINGS: The lack of intravenous contrast limits the ability to evaluate solid abdominal organs. Lower chest: Limited visualization of the lower thorax demonstrates small right-sided pleural effusion. Bibasilar consolidative opacities with associated air bronchograms, right greater than left. Normal heart size.  No pericardial effusion. Hepatobiliary: Mild nodularity hepatic contour. Normal noncontrast appearance of the gallbladder given degree distention. No radiopaque gallstones. No ascites. Pancreas: Normal noncontrast appearance of the pancreas. Spleen: Normal noncontrast appearance of the spleen. Note is made of a small splenule. Adrenals/Urinary Tract: Normal noncontrast appearance of the bilateral kidneys. No evidence of nephrolithiasis. There is a very minimal amount of grossly symmetric bilateral likely age and body habitus related perinephric stranding. No urinary obstruction. Normal noncontrast appearance the bilateral adrenal glands. The urinary bladder was not imaged. Stomach/Bowel: The anterior wall the stomach is well apposed against the ventral wall of the upper abdomen without interposed transverse colon. Enteric tube terminates within the Benjamin Montoya. No evidence of enteric obstruction. Radiopaque material within the cecum may represent portions of radiopaque pill fragments. No pneumoperitoneum, pneumatosis or portal venous gas. Vascular/Lymphatic: Minimal amount of atherosclerotic plaque within a normal caliber abdominal aorta. No bulky within normal caliber abdominal  aorta retroperitoneal or mesenteric lymphadenopathy. Other: Scattered foci of subcutaneous emphysema about the ventral wall of the abdomen,  likely at the location of subcutaneous medication administration. Note is made of a narrow neck mesenteric fat containing periumbilical hernia measuring at least 4.8 x 2.2 cm. A minimal amount of subcutaneous edema is noted about the bilateral flanks in midline of the back. Musculoskeletal: No acute or aggressive osseous abnormalities. Stigmata of DISH within the thoracic spine. IMPRESSION: 1. Gastric anatomy amenable to potential percutaneous gastrostomy tube placement as indicated. 2. Small right-sided pleural effusion with bibasilar consolidative opacities with associated air bronchograms, right greater than left, atelectasis versus infiltrate. 3.  Aortic Atherosclerosis (ICD10-I70.0). Electronically Signed   By: Simonne ComeJohn  Watts M.D.   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 Acute on respiratory failure with hypoxia patient is doing better today with aerosol trach collar trials.  Satting well.  Continue pulmonary toilet and supportive measures. 2. Acute transverse myelitis patient at baseline continue supportive care 3. Healthcare associated pneumonia treated clinically improving continue to monitor 4. Pulmonary embolism at baseline continue supportive care    I have personally seen and evaluated the patient, evaluated laboratory and imaging results, formulated the assessment and plan and placed orders. The Patient requires high complexity decision making for assessment and support.  Case was discussed on Rounds with the Respiratory Therapy Staff  Yevonne PaxSaadat A , MD [facility] Pulmonary Critical Care Medicine Sleep Medicine

## 2018-09-08 DIAGNOSIS — J189 Pneumonia, unspecified organism: Secondary | ICD-10-CM | POA: Diagnosis not present

## 2018-09-08 DIAGNOSIS — I2699 Other pulmonary embolism without acute cor pulmonale: Secondary | ICD-10-CM | POA: Diagnosis not present

## 2018-09-08 DIAGNOSIS — J9621 Acute and chronic respiratory failure with hypoxia: Secondary | ICD-10-CM | POA: Diagnosis not present

## 2018-09-08 DIAGNOSIS — G373 Acute transverse myelitis in demyelinating disease of central nervous system: Secondary | ICD-10-CM | POA: Diagnosis not present

## 2018-09-08 LAB — PROTIME-INR
INR: 1.9 — ABNORMAL HIGH (ref 0.8–1.2)
Prothrombin Time: 21.9 seconds — ABNORMAL HIGH (ref 11.4–15.2)

## 2018-09-08 LAB — URINE CULTURE: Culture: NO GROWTH

## 2018-09-08 NOTE — Consult Note (Signed)
Chief Complaint: Patient was seen in consultation today for percutaneous gastric tube placement at the request of Dr Mariah Milling  Supervising Physician: Aletta Edouard  Patient Status: [facility] IP  History of Present Illness: Benjamin Montoya is a 48 y.o. 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 Acute on chronic respiratory failure with hypoxia ([place])   . 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 Healthcare-associated pneumonia   . Pulmonary embolism (HCC)     Allergies: Patient has no allergy information on record.  Medications: Prior to Admission medications   Not on File     No family history on file.  Social History   Socioeconomic History  . Marital status: Single    Spouse name: Not on file  . Number of children: Not on file  . Years of education: Not on file  . Highest education level: Not on file  Occupational History  . Not on file  Social Needs  . Financial resource strain: Not on file  . Food insecurity    Worry: Not on file    Inability: Not on file  . Transportation needs    Medical: Not on file    Non-medical: Not on file  Tobacco Use  . Smoking status: Not on file  Substance and Sexual Activity  . Alcohol use: Not on file  . Drug use: Not on file  . Sexual activity: Not on file  Lifestyle  . Physical activity    Days per week: Not on file    Minutes per session: Not on file  . Stress: Not on file  Relationships  . Social Herbalist on phone: Not on file    Gets together: Not on file    Attends religious service:  Not on file    Active member of club or organization: Not on file    Attends meetings of clubs or organizations: Not on file    Relationship status: Not on file  Other Topics Concern  . Not on file  Social History Narrative  . Not on file    Review of Systems: A 12 point ROS discussed and pertinent positives are indicated in the HPI above.  All other systems are negative.  Review of Systems  Constitutional: Positive for activity change, appetite change and unexpected weight change. Negative for fatigue and fever.  Respiratory: Negative for cough.   Gastrointestinal: Negative for abdominal pain.  Neurological: Positive for weakness.  Psychiatric/Behavioral: Negative for behavioral problems.    Vital Signs: There were no vitals taken for this visit.  Physical Exam Vitals signs reviewed.  HENT:     Mouth/Throat:     Mouth: Mucous membranes are moist.  Cardiovascular:     Rate and Rhythm: Normal rate and regular rhythm.     Heart sounds: Normal heart sounds.  Pulmonary:     Breath sounds: Normal breath sounds.     Comments: vent Abdominal:     Palpations: Abdomen is soft.  Musculoskeletal:     Comments: Transverse myelitis Little to no movement of extremities  Skin:    General: Skin is warm and  dry.  Neurological:     Mental Status: He is alert.  Psychiatric:     Comments: Consented with Mother via phone Although pt does seem able to understand procedure and needs-- able to mouth words and appropriate questions     Imaging: Ct Abdomen Wo Contrast  Result Date: 09/07/2018 CLINICAL DATA:  Evaluate anatomy prior to potential percutaneous gastrostomy tube placement. EXAM: CT ABDOMEN WITHOUT CONTRAST TECHNIQUE: Multidetector CT imaging of the abdomen was performed following the standard protocol without IV contrast. COMPARISON:  None. FINDINGS: The lack of intravenous contrast limits the ability to evaluate solid abdominal organs. Lower chest: Limited visualization of the  lower thorax demonstrates small right-sided pleural effusion. Bibasilar consolidative opacities with associated air bronchograms, right greater than left. Normal heart size.  No pericardial effusion. Hepatobiliary: Mild nodularity hepatic contour. Normal noncontrast appearance of the gallbladder given degree distention. No radiopaque gallstones. No ascites. Pancreas: Normal noncontrast appearance of the pancreas. Spleen: Normal noncontrast appearance of the spleen. Note is made of a small splenule. Adrenals/Urinary Tract: Normal noncontrast appearance of the bilateral kidneys. No evidence of nephrolithiasis. There is a very minimal amount of grossly symmetric bilateral likely age and body habitus related perinephric stranding. No urinary obstruction. Normal noncontrast appearance the bilateral adrenal glands. The urinary bladder was not imaged. Stomach/Bowel: The anterior wall the stomach is well apposed against the ventral wall of the upper abdomen without interposed transverse colon. Enteric tube terminates within the DJJ. No evidence of enteric obstruction. Radiopaque material within the cecum may represent portions of radiopaque pill fragments. No pneumoperitoneum, pneumatosis or portal venous gas. Vascular/Lymphatic: Minimal amount of atherosclerotic plaque within a normal caliber abdominal aorta. No bulky within normal caliber abdominal aorta retroperitoneal or mesenteric lymphadenopathy. Other: Scattered foci of subcutaneous emphysema about the ventral wall of the abdomen, likely at the location of subcutaneous medication administration. Note is made of a narrow neck mesenteric fat containing periumbilical hernia measuring at least 4.8 x 2.2 cm. A minimal amount of subcutaneous edema is noted about the bilateral flanks in midline of the back. Musculoskeletal: No acute or aggressive osseous abnormalities. Stigmata of DISH within the thoracic spine. IMPRESSION: 1. Gastric anatomy amenable to potential  percutaneous gastrostomy tube placement as indicated. 2. Small right-sided pleural effusion with bibasilar consolidative opacities with associated air bronchograms, right greater than left, atelectasis versus infiltrate. 3.  Aortic Atherosclerosis (ICD10-I70.0). Electronically Signed   By: Simonne ComeJohn  Watts M.D.   On: 09/07/2018 08:11   Dg Chest Port 1 View  Result Date: 09/07/2018 CLINICAL DATA:  Pneumonia EXAM: PORTABLE CHEST 1 VIEW COMPARISON:  08/31/2018 FINDINGS: The tracheostomy tube terminates above the carina. The enteric tube appears to extend below the left hemidiaphragm. There is a persistent moderate size right-sided pleural effusion. There is a persistent dense retrocardiac opacity. There is likely a trace to small left-sided pleural effusion. There is no pneumothorax. IMPRESSION: 1. Lines and tubes as above. 2. Stable appearance the chest with no significant oval change with compared to x-ray dated 08/31/2018. There is a persistent moderate size right-sided pleural effusion and a dense retrocardiac opacity which may represent atelectasis or pneumonia. Electronically Signed   By: Katherine Mantlehristopher  Green M.D.   On: 09/07/2018 16:15   Dg Chest Port 1 View  Result Date: 08/31/2018 CLINICAL DATA:  Pneumonia. EXAM: PORTABLE CHEST 1 VIEW COMPARISON:  08/27/2018 FINDINGS: The tracheostomy tube is stable. The right PICC line is also stable. There is a feeding tube coursing down the esophagus and into the stomach. Persistent bilateral  pleural effusions and bibasilar infiltrates, right greater than left. The cardiac silhouette, mediastinal and hilar contours are unchanged. Mild vascular congestion. IMPRESSION: 1. Stable support apparatus. 2. Persistent bilateral effusions and bibasilar infiltrates. Electronically Signed   By: Rudie MeyerP.  Gallerani M.D.   On: 08/31/2018 09:58   Dg Chest Port 1 View  Result Date: 08/27/2018 CLINICAL DATA:  Tube placement EXAM: PORTABLE CHEST 1 VIEW COMPARISON:  None. FINDINGS: The  tracheostomy tube terminates above the carina. The right-sided PICC line tip terminates over the SVC. The enteric tube extends below the left hemidiaphragm. The cardiac silhouette is borderline enlarged. There are small to moderate-sized bilateral pleural effusions. There is a dense retrocardiac opacity. There is no evidence of a pneumothorax. IMPRESSION: 1. Lines and tubes as above. 2. Small to moderate-sized bilateral pleural effusions. 3. Dense retrocardiac opacity which may represent atelectasis or infiltrate. Electronically Signed   By: Katherine Mantlehristopher  Green M.D.   On: 08/27/2018 22:16   Dg Abd Portable 1v  Result Date: 08/27/2018 CLINICAL DATA:  Encounter for nasogastric tube placement. EXAM: PORTABLE ABDOMEN - 1 VIEW COMPARISON:  None. FINDINGS: Nasogastric tube or feeding tube is been inserted, and has passed through the stomach, with the tip near the ligament of Treitz. IMPRESSION: Satisfactory nasogastric tube placement. Electronically Signed   By: Elsie StainJohn T Curnes M.D.   On: 08/27/2018 22:12    Labs:  CBC: Recent Labs    08/28/18 0232 08/31/18 0503 09/07/18 0803  WBC 12.4* 9.1 16.2*  HGB 7.7* 8.1* 9.9*  HCT 25.8* 26.9* 32.4*  PLT 339 370 418*    COAGS: Recent Labs    09/05/18 0858 09/06/18 0813 09/07/18 0741 09/08/18 0707  INR 2.3* 2.0* 2.5* 1.9*    BMP: Recent Labs    08/28/18 0232 08/31/18 0503 09/07/18 0803  NA 138 137 134*  K 4.1 4.1 4.7  CL 93* 96* 95*  CO2 33* 29 28  GLUCOSE 98 98 113*  BUN 18 14 15   CALCIUM 9.7 10.0 9.5  CREATININE 0.52* 0.39* 0.54*  GFRNONAA >60 >60 >60  GFRAA >60 >60 >60    LIVER FUNCTION TESTS: Recent Labs    08/28/18 0232  BILITOT 0.6  AST 37  ALT 58*  ALKPHOS 219*  PROT 7.2  ALBUMIN 2.9*    TUMOR MARKERS: No results for input(s): AFPTM, CEA, CA199, CHROMGRNA in the last 8760 hours.  Assessment and Plan:  Transverse  myelitis- demyelinating disease Dysphagia Trach/vent Will need long term care Scheduled for  percutaneous gastric tube placement likely June 22 Wbc is high now at 16 INR 1.9 today LD coumadin 6/16 Will get Covid test later in week for pre procedure requirement Risks and benefits discussed with the patient and his mother via phone including, but not limited to the need for a barium enema during the procedure, bleeding, infection, peritonitis, or damage to adjacent structures.  All of the patient's and family questions were answered, agreeable to proceed. Consent signed and in chart.    Thank you for this interesting consult.  I greatly enjoyed meeting Benjamin Montoya and look forward to participating in their care.  A copy of this report was sent to the requesting provider on this date.  Electronically Signed: Robet LeuPamela A Furman Trentman, PA-C 09/08/2018, 9:08 AM   I spent a total of 40 Minutes    in face to face in clinical consultation, greater than 50% of which was counseling/coordinating care for percutaneous gastric tube placement

## 2018-09-08 NOTE — Progress Notes (Addendum)
[facility] Critical [facility]   PULMONARY CRITICAL CARE SERVICE  PROGRESS NOTE  Date of Service: 09/08/2018  Benjamin MilletDavid Montoya  ZOX:[id number]  DOB: Jun 05, 1970   DOA: 08/27/2018  Referring Physician: Carron CurieAli Hijazi, MD  HPI: Benjamin Montoya is a 48 y.o. male seen for follow up of Acute on Chronic Respiratory Failure.  Patient doing over support over 5 and FiO2 35% with akinetic 3 hours on aerosol trach collar today is now resting back on pressure support.  Medications: Reviewed on Rounds  Physical Exam:  Vitals: Pulse 106 respirations 20 BP 120/57 O2 sat 95% temp 99.4  Ventilator Settings pressure support 12/5 FiO2 35%  . General: Comfortable at this time . Eyes: Grossly normal lids, irises & conjunctiva . ENT: grossly tongue is normal . Neck: no obvious mass . Cardiovascular: S1 S2 normal no gallop . Respiratory: Rales or rhonchi noted . Abdomen: soft . Skin: no rash seen on limited exam . Musculoskeletal: not rigid . Psychiatric:unable to assess . Neurologic: no seizure no involuntary movements         Lab Data:   Basic Metabolic Panel: Recent Labs  Lab 09/07/18 0803  NA 134*  K 4.7  CL 95*  CO2 28  GLUCOSE 113*  BUN 15  CREATININE 0.54*  CALCIUM 9.5    ABG: No results for input(s): PHART, PCO2ART, PO2ART, HCO3, O2SAT in the last 168 hours.  Liver Function Tests: No results for input(s): AST, ALT, ALKPHOS, BILITOT, PROT, ALBUMIN in the last 168 hours. No results for input(s): LIPASE, AMYLASE in the last 168 hours. No results for input(s): AMMONIA in the last 168 hours.  CBC: Recent Labs  Lab 09/07/18 0803  WBC 16.2*  HGB 9.9*  HCT 32.4*  MCV 87.8  PLT 418*    Cardiac Enzymes: No results for input(s): CKTOTAL, CKMB, CKMBINDEX, TROPONINI in the last 168 hours.  BNP (last 3 results) No results for input(s): BNP in the last 8760 hours.  ProBNP (last 3 results) No results for input(s): PROBNP in the last 8760  hours.  Radiological Exams: Ct Abdomen Wo Contrast  Result Date: 09/07/2018 CLINICAL DATA:  Evaluate anatomy prior to potential percutaneous gastrostomy tube placement. EXAM: CT ABDOMEN WITHOUT CONTRAST TECHNIQUE: Multidetector CT imaging of the abdomen was performed following the standard protocol without IV contrast. COMPARISON:  None. FINDINGS: The lack of intravenous contrast limits the ability to evaluate solid abdominal organs. Lower chest: Limited visualization of the lower thorax demonstrates small right-sided pleural effusion. Bibasilar consolidative opacities with associated air bronchograms, right greater than left. Normal heart size.  No pericardial effusion. Hepatobiliary: Mild nodularity hepatic contour. Normal noncontrast appearance of the gallbladder given degree distention. No radiopaque gallstones. No ascites. Pancreas: Normal noncontrast appearance of the pancreas. Spleen: Normal noncontrast appearance of the spleen. Note is made of a small splenule. Adrenals/Urinary Tract: Normal noncontrast appearance of the bilateral kidneys. No evidence of nephrolithiasis. There is a very minimal amount of grossly symmetric bilateral likely age and body habitus related perinephric stranding. No urinary obstruction. Normal noncontrast appearance the bilateral adrenal glands. The urinary bladder was not imaged. Stomach/Bowel: The anterior wall the stomach is well apposed against the ventral wall of the upper abdomen without interposed transverse colon. Enteric tube terminates within the DJJ. No evidence of enteric obstruction. Radiopaque material within the cecum may represent portions of radiopaque pill fragments. No pneumoperitoneum, pneumatosis or portal venous gas. Vascular/Lymphatic: Minimal amount of atherosclerotic plaque within a normal caliber abdominal aorta. No bulky within normal  caliber abdominal aorta retroperitoneal or mesenteric lymphadenopathy. Other: Scattered foci of subcutaneous  emphysema about the ventral wall of the abdomen, likely at the location of subcutaneous medication administration. Note is made of a narrow neck mesenteric fat containing periumbilical hernia measuring at least 4.8 x 2.2 cm. A minimal amount of subcutaneous edema is noted about the bilateral flanks in midline of the back. Musculoskeletal: No acute or aggressive osseous abnormalities. Stigmata of DISH within the thoracic spine. IMPRESSION: 1. Gastric anatomy amenable to potential percutaneous gastrostomy tube placement as indicated. 2. Small right-sided pleural effusion with bibasilar consolidative opacities with associated air bronchograms, right greater than left, atelectasis versus infiltrate. 3.  Aortic Atherosclerosis (ICD10-I70.0). Electronically Signed   By: Sandi Mariscal M.D.   On: 09/07/2018 08:11   Dg Chest Port 1 View  Result Date: 09/07/2018 CLINICAL DATA:  Pneumonia EXAM: PORTABLE CHEST 1 VIEW COMPARISON:  08/31/2018 FINDINGS: The tracheostomy tube terminates above the carina. The enteric tube appears to extend below the left hemidiaphragm. There is a persistent moderate size right-sided pleural effusion. There is a persistent dense retrocardiac opacity. There is likely a trace to small left-sided pleural effusion. There is no pneumothorax. IMPRESSION: 1. Lines and tubes as above. 2. Stable appearance the chest with no significant oval change with compared to x-ray dated 08/31/2018. There is a persistent moderate size right-sided pleural effusion and a dense retrocardiac opacity which may represent atelectasis or pneumonia. Electronically Signed   By: Constance Holster M.D.   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 Acute on respiratory failure with hypoxiapatient   able to do 3 hours of aerosol trach collar today now back on pressure support 12/5 FiO2 35%.  Satting well at this time.  Continue to wean as tolerated acute transverse myelitis patient at baseline continue supportive care 2. Healthcare associated pneumonia treated clinically improving continue to monitor 3. Pulmonary embolism at baseline continue supportive care   I have personally seen and evaluated the patient, evaluated laboratory and imaging results, formulated the assessment and plan and placed orders. The Patient requires high complexity decision making for assessment and support.  Case was discussed on Rounds with the Respiratory Therapy Staff  Allyne Gee, MD [facility] Pulmonary Critical Care Medicine Sleep Medicine

## 2018-09-09 DIAGNOSIS — J9621 Acute and chronic respiratory failure with hypoxia: Secondary | ICD-10-CM | POA: Diagnosis not present

## 2018-09-09 DIAGNOSIS — I2699 Other pulmonary embolism without acute cor pulmonale: Secondary | ICD-10-CM | POA: Diagnosis not present

## 2018-09-09 DIAGNOSIS — J189 Pneumonia, unspecified organism: Secondary | ICD-10-CM | POA: Diagnosis not present

## 2018-09-09 DIAGNOSIS — G373 Acute transverse myelitis in demyelinating disease of central nervous system: Secondary | ICD-10-CM | POA: Diagnosis not present

## 2018-09-09 LAB — CBC WITH DIFFERENTIAL/PLATELET
Abs Immature Granulocytes: 0.21 10*3/uL — ABNORMAL HIGH (ref 0.00–0.07)
Basophils Absolute: 0.1 10*3/uL (ref 0.0–0.1)
Basophils Relative: 1 %
Eosinophils Absolute: 0.4 10*3/uL (ref 0.0–0.5)
Eosinophils Relative: 2 %
HCT: 32.4 % — ABNORMAL LOW (ref 39.0–52.0)
Hemoglobin: 9.9 g/dL — ABNORMAL LOW (ref 13.0–17.0)
Immature Granulocytes: 1 %
Lymphocytes Relative: 5 %
Lymphs Abs: 0.9 10*3/uL (ref 0.7–4.0)
MCH: 26.4 pg (ref 26.0–34.0)
MCHC: 30.6 g/dL (ref 30.0–36.0)
MCV: 86.4 fL (ref 80.0–100.0)
Monocytes Absolute: 2.2 10*3/uL — ABNORMAL HIGH (ref 0.1–1.0)
Monocytes Relative: 13 %
Neutro Abs: 13 10*3/uL — ABNORMAL HIGH (ref 1.7–7.7)
Neutrophils Relative %: 78 %
Platelets: 369 10*3/uL (ref 150–400)
RBC: 3.75 MIL/uL — ABNORMAL LOW (ref 4.22–5.81)
RDW: 15.4 % (ref 11.5–15.5)
WBC: 16.7 10*3/uL — ABNORMAL HIGH (ref 4.0–10.5)
nRBC: 0 % (ref 0.0–0.2)

## 2018-09-09 LAB — PROTIME-INR
INR: 1.8 — ABNORMAL HIGH (ref 0.8–1.2)
INR: 1.6 — ABNORMAL HIGH (ref 0.8–1.2)
Prothrombin Time: 20.8 seconds — ABNORMAL HIGH (ref 11.4–15.2)
Prothrombin Time: 18.8 seconds — ABNORMAL HIGH (ref 11.4–15.2)

## 2018-09-09 LAB — APTT: aPTT: 50 seconds — ABNORMAL HIGH (ref 24–36)

## 2018-09-09 LAB — HEPARIN LEVEL (UNFRACTIONATED): Heparin Unfractionated: <0.1 IU/mL — ABNORMAL LOW (ref 0.30–0.70)

## 2018-09-09 NOTE — Progress Notes (Addendum)
Pulmonary Critical Care Medicine [place]   PULMONARY CRITICAL CARE SERVICE  PROGRESS NOTE  Date of Service: 09/09/2018  Benjamin Montoya  QIW:[id number]  DOB: 08-01-1970   DOA: 08/27/2018  Referring Physician: Merton Border, MD  HPI: Benjamin Montoya is a 48 y.o. male seen for follow up of Acute on Chronic Respiratory Failure.  Patient remains on 20% trach collar for 12-hour goal today.  Has a moderate amount of secretions noted.  Medications: Reviewed on Rounds  Physical Exam:  Vitals: Pulse 100 respirations 20 BP 140/60 O2 sat 97% temp 100.1  Ventilator Settings 71 ATC 28%  . General: Comfortable at this time . Eyes: Grossly normal lids, irises & conjunctiva . ENT: grossly tongue is normal . Neck: no obvious mass . Cardiovascular: S1 S2 normal no gallop . Respiratory: No rales or rhonchi noted . Abdomen: soft . Skin: no rash seen on limited exam . Musculoskeletal: not rigid . Psychiatric:unable to assess . Neurologic: no seizure no involuntary movements         Lab Data:   Basic Metabolic Panel: Recent Labs  Lab 09/07/18 0803  NA 134*  K 4.7  CL 95*  CO2 28  GLUCOSE 113*  BUN 15  CREATININE 0.54*  CALCIUM 9.5    ABG: No results for input(s): PHART, PCO2ART, PO2ART, HCO3, O2SAT in the last 168 hours.  Liver Function Tests: No results for input(s): AST, ALT, ALKPHOS, BILITOT, PROT, ALBUMIN in the last 168 hours. No results for input(s): LIPASE, AMYLASE in the last 168 hours. No results for input(s): AMMONIA in the last 168 hours.  CBC: Recent Labs  Lab 09/07/18 0803 09/09/18 1322  WBC 16.2* 16.7*  NEUTROABS  --  13.0*  HGB 9.9* 9.9*  HCT 32.4* 32.4*  MCV 87.8 86.4  PLT 418* 369    Cardiac Enzymes: No results for input(s): CKTOTAL, CKMB, CKMBINDEX, TROPONINI in the last 168 hours.  BNP (last 3 results) No results for input(s): BNP in the last 8760 hours.  ProBNP (last 3 results) No results for input(s): PROBNP in the last  8760 hours.  Radiological Exams: No results found.  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 Acute on respiratory failure with hypoxiapatientcurrently on aerosol trach collar 20% FiO2 for goal of 12 hours today. Satting well at this time.  Continue to wean as tolerated acute transverse myelitis patient at baseline continue supportive care 2. Healthcare associated pneumonia treated clinically improving continue to monitor 3. Pulmonary embolism at baseline continue supportive care   I have personally seen and evaluated the patient, evaluated laboratory and imaging results, formulated the assessment and plan and placed orders. The Patient requires high complexity decision making for assessment and support.  Case was discussed on Rounds with the Respiratory Therapy Staff  Allyne Gee, MD [facility] Pulmonary Critical Care Medicine Sleep Medicine

## 2018-09-10 DIAGNOSIS — G373 Acute transverse myelitis in demyelinating disease of central nervous system: Secondary | ICD-10-CM | POA: Diagnosis not present

## 2018-09-10 DIAGNOSIS — J9621 Acute and chronic respiratory failure with hypoxia: Secondary | ICD-10-CM | POA: Diagnosis not present

## 2018-09-10 DIAGNOSIS — I2699 Other pulmonary embolism without acute cor pulmonale: Secondary | ICD-10-CM | POA: Diagnosis not present

## 2018-09-10 DIAGNOSIS — J189 Pneumonia, unspecified organism: Secondary | ICD-10-CM | POA: Diagnosis not present

## 2018-09-10 LAB — PROTIME-INR
INR: 1.5 — ABNORMAL HIGH (ref 0.8–1.2)
Prothrombin Time: 17.8 seconds — ABNORMAL HIGH (ref 11.4–15.2)

## 2018-09-10 LAB — HEPARIN LEVEL (UNFRACTIONATED)
Heparin Unfractionated: <0.1 IU/mL — ABNORMAL LOW (ref 0.30–0.70)
Heparin Unfractionated: 0.19 IU/mL — ABNORMAL LOW (ref 0.30–0.70)
Heparin Unfractionated: 0.4 IU/mL (ref 0.30–0.70)

## 2018-09-10 NOTE — Progress Notes (Addendum)
Pulmonary Critical Care Medicine [place]   PULMONARY CRITICAL CARE SERVICE  PROGRESS NOTE  Date of Service: 09/10/2018  Kaius Daino  [id number]  DOB: 08-28-1970   DOA: 08/27/2018  Referring Physician: Merton Border, MD  HPI: Pritesh Sobecki is a 48 y.o. male seen for follow up of Acute on Chronic Respiratory Failure.  Remains on aerosol trach collar 28% FiO2 has a goal of 16 to 20 hours today.  Satting well currently with no distress.  Medications: Reviewed on Rounds  Physical Exam:  Vitals: Pulse 80 respirations 20 BP 124/73 O2 sat 98% temp 98.4  Ventilator Settings ATC 28%  . General: Comfortable at this time . Eyes: Grossly normal lids, irises & conjunctiva . ENT: grossly tongue is normal . Neck: no obvious mass . Cardiovascular: S1 S2 normal no gallop . Respiratory: No rales or rhonchi noted . Abdomen: soft . Skin: no rash seen on limited exam . Musculoskeletal: not rigid . Psychiatric:unable to assess . Neurologic: no seizure no involuntary movements         Lab Data:   Basic Metabolic Panel: Recent Labs  Lab 09/07/18 0803  NA 134*  K 4.7  CL 95*  CO2 28  GLUCOSE 113*  BUN 15  CREATININE 0.54*  CALCIUM 9.5    ABG: No results for input(s): PHART, PCO2ART, PO2ART, HCO3, O2SAT in the last 168 hours.  Liver Function Tests: No results for input(s): AST, ALT, ALKPHOS, BILITOT, PROT, ALBUMIN in the last 168 hours. No results for input(s): LIPASE, AMYLASE in the last 168 hours. No results for input(s): AMMONIA in the last 168 hours.  CBC: Recent Labs  Lab 09/07/18 0803 09/09/18 1322  WBC 16.2* 16.7*  NEUTROABS  --  13.0*  HGB 9.9* 9.9*  HCT 32.4* 32.4*  MCV 87.8 86.4  PLT 418* 369    Cardiac Enzymes: No results for input(s): CKTOTAL, CKMB, CKMBINDEX, TROPONINI in the last 168 hours.  BNP (last 3 results) No results for input(s): BNP in the last 8760 hours.  ProBNP (last 3 results) No results for input(s): PROBNP in  the last 8760 hours.  Radiological Exams: No results found.  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 Acute on respiratory failure with hypoxiapatientcurrently on aerosol trach collar 28% FiO2 for goal of 16-20 hours today.Satting well at this time. Continue to wean as tolerated acute transverse myelitis patient at baseline continue supportive care 2. Healthcare associated pneumonia treated clinically improving continue to monitor 3. Pulmonary embolism at baseline continue supportive care   I have personally seen and evaluated the patient, evaluated laboratory and imaging results, formulated the assessment and plan and placed orders. The Patient requires high complexity decision making for assessment and support.  Case was discussed on Rounds with the Respiratory Therapy Staff  Allyne Gee, MD [facility] Pulmonary Critical Care Medicine Sleep Medicine

## 2018-09-11 DIAGNOSIS — J9621 Acute and chronic respiratory failure with hypoxia: Secondary | ICD-10-CM | POA: Diagnosis not present

## 2018-09-11 DIAGNOSIS — J189 Pneumonia, unspecified organism: Secondary | ICD-10-CM | POA: Diagnosis not present

## 2018-09-11 DIAGNOSIS — I2699 Other pulmonary embolism without acute cor pulmonale: Secondary | ICD-10-CM | POA: Diagnosis not present

## 2018-09-11 DIAGNOSIS — G373 Acute transverse myelitis in demyelinating disease of central nervous system: Secondary | ICD-10-CM | POA: Diagnosis not present

## 2018-09-11 LAB — HEPARIN LEVEL (UNFRACTIONATED)
Heparin Unfractionated: 0.15 IU/mL — ABNORMAL LOW (ref 0.30–0.70)
Heparin Unfractionated: 0.21 IU/mL — ABNORMAL LOW (ref 0.30–0.70)
Heparin Unfractionated: 0.34 IU/mL (ref 0.30–0.70)
Heparin Unfractionated: 0.22 IU/mL — ABNORMAL LOW (ref 0.30–0.70)

## 2018-09-11 LAB — PROTIME-INR
INR: 1.2 (ref 0.8–1.2)
Prothrombin Time: 15.3 seconds — ABNORMAL HIGH (ref 11.4–15.2)

## 2018-09-11 NOTE — Progress Notes (Addendum)
Pulmonary Critical Care Medicine [place]   PULMONARY CRITICAL CARE SERVICE  PROGRESS NOTE  Date of Service: 09/11/2018  Benjamin Montoya  [id number]  DOB: [birth year]   DOA: 08/27/2018  Referring Physician: Merton Border, MD  HPI: Benjamin Montoya is a 48 y.o. male seen for follow up of Acute on Chronic Respiratory Failure.  Patient remains on aerosol trach collar 28% FiO2 using PMV without difficulty.  Patient has a 24-hour goal at this time.  Medications: Reviewed on Rounds  Physical Exam:  Vitals: Pulse 99 respirations 20 BP 131/72 O2 sat 96% temp 97.0  Ventilator Settings ATC 28%  . General: Comfortable at this time . Eyes: Grossly normal lids, irises & conjunctiva . ENT: grossly tongue is normal . Neck: no obvious mass . Cardiovascular: S1 S2 normal no gallop . Respiratory: No rales or rhonchi noted . Abdomen: soft . Skin: no rash seen on limited exam . Musculoskeletal: not rigid . Psychiatric:unable to assess . Neurologic: no seizure no involuntary movements         Lab Data:   Basic Metabolic Panel: Recent Labs  Lab 09/07/18 0803  NA 134*  K 4.7  CL 95*  CO2 28  GLUCOSE 113*  BUN 15  CREATININE 0.54*  CALCIUM 9.5    ABG: No results for input(s): PHART, PCO2ART, PO2ART, HCO3, O2SAT in the last 168 hours.  Liver Function Tests: No results for input(s): AST, ALT, ALKPHOS, BILITOT, PROT, ALBUMIN in the last 168 hours. No results for input(s): LIPASE, AMYLASE in the last 168 hours. No results for input(s): AMMONIA in the last 168 hours.  CBC: Recent Labs  Lab 09/07/18 0803 09/09/18 1322  WBC 16.2* 16.7*  NEUTROABS  --  13.0*  HGB 9.9* 9.9*  HCT 32.4* 32.4*  MCV 87.8 86.4  PLT 418* 369    Cardiac Enzymes: No results for input(s): CKTOTAL, CKMB, CKMBINDEX, TROPONINI in the last 168 hours.  BNP (last 3 results) No results for input(s): BNP in the last 8760 hours.  ProBNP (last 3 results) No results for input(s): PROBNP in  the last 8760 hours.  Radiological Exams: No results found.  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 Acute on respiratory failure with hypoxiapatientcurrently on aerosol trach collar 28% FiO2 for goal of 24 hours today.Satting well at this time. Continue to wean as tolerated acute transverse myelitis patient at baseline continue supportive care 2. Healthcare associated pneumonia treated clinically improving continue to monitor 3. Pulmonary embolism at baseline continue supportive care   I have personally seen and evaluated the patient, evaluated laboratory and imaging results, formulated the assessment and plan and placed orders. The Patient requires high complexity decision making for assessment and support.  Case was discussed on Rounds with the Respiratory Therapy Staff  Allyne Gee, MD [facility] Pulmonary Critical Care Medicine Sleep Medicine

## 2018-09-12 DIAGNOSIS — G373 Acute transverse myelitis in demyelinating disease of central nervous system: Secondary | ICD-10-CM | POA: Diagnosis not present

## 2018-09-12 DIAGNOSIS — J189 Pneumonia, unspecified organism: Secondary | ICD-10-CM | POA: Diagnosis not present

## 2018-09-12 DIAGNOSIS — I2699 Other pulmonary embolism without acute cor pulmonale: Secondary | ICD-10-CM | POA: Diagnosis not present

## 2018-09-12 DIAGNOSIS — J9621 Acute and chronic respiratory failure with hypoxia: Secondary | ICD-10-CM | POA: Diagnosis not present

## 2018-09-12 LAB — CULTURE, BLOOD (ROUTINE X 2)
Culture: NO GROWTH
Culture: NO GROWTH
Special Requests: ADEQUATE
Special Requests: ADEQUATE

## 2018-09-12 LAB — PROTIME-INR
INR: 1.3 — ABNORMAL HIGH (ref 0.8–1.2)
Prothrombin Time: 16 seconds — ABNORMAL HIGH (ref 11.4–15.2)

## 2018-09-12 LAB — SARS CORONAVIRUS 2 BY RT PCR (HOSPITAL ORDER, PERFORMED IN ~~LOC~~ HOSPITAL LAB): SARS Coronavirus 2: NEGATIVE

## 2018-09-12 LAB — HEPARIN LEVEL (UNFRACTIONATED)
Heparin Unfractionated: 0.63 IU/mL (ref 0.30–0.70)
Heparin Unfractionated: 0.24 IU/mL — ABNORMAL LOW (ref 0.30–0.70)
Heparin Unfractionated: 0.35 IU/mL (ref 0.30–0.70)

## 2018-09-12 LAB — HEMOGLOBIN AND HEMATOCRIT, BLOOD
HCT: 29.5 % — ABNORMAL LOW (ref 39.0–52.0)
Hemoglobin: 9 g/dL — ABNORMAL LOW (ref 13.0–17.0)

## 2018-09-12 NOTE — Progress Notes (Addendum)
Pulmonary Critical Care Medicine [place]   PULMONARY CRITICAL CARE SERVICE  PROGRESS NOTE  Date of Service: 09/12/2018  Benjamin Montoya  [id number]  DOB: Jul 01, 1970   DOA: 08/27/2018  Referring Physician: Merton Border, MD  HPI: Benjamin Montoya is a 48 y.o. male seen for follow up of Acute on Chronic Respiratory Failure.  Patient continues on aerosol trach collar 28% FiO2 currently has a 48-hour goal.  Satting well with no distress.  Medications: Reviewed on Rounds  Physical Exam:  Vitals: Pulse 97 respirations 18 BP 116/54 O2 sat 95% to 98.6  Ventilator Settings ATC 28%  . General: Comfortable at this time . Eyes: Grossly normal lids, irises & conjunctiva . ENT: grossly tongue is normal . Neck: no obvious mass . Cardiovascular: S1 S2 normal no gallop . Respiratory: No rales or rhonchi noted . Abdomen: soft . Skin: no rash seen on limited exam . Musculoskeletal: not rigid . Psychiatric:unable to assess . Neurologic: no seizure no involuntary movements         Lab Data:   Basic Metabolic Panel: Recent Labs  Lab 09/07/18 0803  NA 134*  K 4.7  CL 95*  CO2 28  GLUCOSE 113*  BUN 15  CREATININE 0.54*  CALCIUM 9.5    ABG: No results for input(s): PHART, PCO2ART, PO2ART, HCO3, O2SAT in the last 168 hours.  Liver Function Tests: No results for input(s): AST, ALT, ALKPHOS, BILITOT, PROT, ALBUMIN in the last 168 hours. No results for input(s): LIPASE, AMYLASE in the last 168 hours. No results for input(s): AMMONIA in the last 168 hours.  CBC: Recent Labs  Lab 09/07/18 0803 09/09/18 1322 09/12/18 1612  WBC 16.2* 16.7*  --   NEUTROABS  --  13.0*  --   HGB 9.9* 9.9* 9.0*  HCT 32.4* 32.4* 29.5*  MCV 87.8 86.4  --   PLT 418* 369  --     Cardiac Enzymes: No results for input(s): CKTOTAL, CKMB, CKMBINDEX, TROPONINI in the last 168 hours.  BNP (last 3 results) No results for input(s): BNP in the last 8760 hours.  ProBNP (last 3  results) No results for input(s): PROBNP in the last 8760 hours.  Radiological Exams: No results found.  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 Acute on respiratory failure with hypoxiapatientcurrently on aerosol trach collar 28% FiO2 for goal of48 hours today.Satting well at this time. Continue to wean as tolerated acute transverse myelitis patient at baseline continue supportive care 2. Healthcare associated pneumonia treated clinically improving continue to monitor 3. Pulmonary embolism at baseline continue supportive care   I have personally seen and evaluated the patient, evaluated laboratory and imaging results, formulated the assessment and plan and placed orders. The Patient requires high complexity decision making for assessment and support.  Case was discussed on Rounds with the Respiratory Therapy Staff  Allyne Gee, MD [facility] Pulmonary Critical Care Medicine Sleep Medicine

## 2018-09-13 ENCOUNTER — Other Ambulatory Visit (HOSPITAL_COMMUNITY): Payer: Self-pay

## 2018-09-13 ENCOUNTER — Encounter (HOSPITAL_COMMUNITY): Payer: Self-pay | Admitting: Interventional Radiology

## 2018-09-13 DIAGNOSIS — J9621 Acute and chronic respiratory failure with hypoxia: Secondary | ICD-10-CM | POA: Diagnosis not present

## 2018-09-13 DIAGNOSIS — G373 Acute transverse myelitis in demyelinating disease of central nervous system: Secondary | ICD-10-CM | POA: Diagnosis not present

## 2018-09-13 DIAGNOSIS — I2699 Other pulmonary embolism without acute cor pulmonale: Secondary | ICD-10-CM | POA: Diagnosis not present

## 2018-09-13 DIAGNOSIS — J189 Pneumonia, unspecified organism: Secondary | ICD-10-CM | POA: Diagnosis not present

## 2018-09-13 HISTORY — PX: IR GASTROSTOMY TUBE MOD SED: IMG625

## 2018-09-13 LAB — PROTIME-INR
INR: 1.2 (ref 0.8–1.2)
Prothrombin Time: 14.8 seconds (ref 11.4–15.2)

## 2018-09-13 MED ORDER — IOHEXOL 300 MG/ML  SOLN: 10.0000 mL | Freq: Once | INTRAMUSCULAR | Status: DC | PRN

## 2018-09-13 MED ORDER — FENTANYL CITRATE (PF) 100 MCG/2ML IJ SOLN
INTRAMUSCULAR | Status: AC | PRN
  Administered 2018-09-13: 25 ug via INTRAVENOUS
  Administered 2018-09-13: 50 ug via INTRAVENOUS
  Administered 2018-09-13: 25 ug via INTRAVENOUS

## 2018-09-13 MED ORDER — CEFAZOLIN SODIUM-DEXTROSE 2-4 GM/100ML-% IV SOLN
INTRAVENOUS | Status: AC
  Filled 2018-09-13: qty 100

## 2018-09-13 MED ORDER — FENTANYL CITRATE (PF) 100 MCG/2ML IJ SOLN
INTRAMUSCULAR | Status: AC
  Filled 2018-09-13: qty 2

## 2018-09-13 MED ORDER — MIDAZOLAM HCL 2 MG/2ML IJ SOLN
INTRAMUSCULAR | Status: AC | PRN
  Administered 2018-09-13 (×2): 0.5 mg via INTRAVENOUS
  Administered 2018-09-13: 1 mg via INTRAVENOUS

## 2018-09-13 MED ORDER — GLUCAGON HCL RDNA (DIAGNOSTIC) 1 MG IJ SOLR
INTRAMUSCULAR | Status: AC
  Filled 2018-09-13: qty 1

## 2018-09-13 MED ORDER — LIDOCAINE HCL (PF) 1 % IJ SOLN
INTRAMUSCULAR | Status: AC | PRN
  Administered 2018-09-13: 5 mL

## 2018-09-13 MED ORDER — GLUCAGON HCL RDNA (DIAGNOSTIC) 1 MG IJ SOLR
INTRAMUSCULAR | Status: AC | PRN
  Administered 2018-09-13: .5 mg via INTRAVENOUS

## 2018-09-13 MED ORDER — LIDOCAINE HCL 1 % IJ SOLN
INTRAMUSCULAR | Status: AC
  Filled 2018-09-13: qty 20

## 2018-09-13 MED ORDER — CEFAZOLIN SODIUM-DEXTROSE 1-4 GM/50ML-% IV SOLN
INTRAVENOUS | Status: AC | PRN
  Administered 2018-09-13: 2 g via INTRAVENOUS

## 2018-09-13 MED ORDER — MIDAZOLAM HCL 2 MG/2ML IJ SOLN
INTRAMUSCULAR | Status: AC
  Filled 2018-09-13: qty 2

## 2018-09-13 NOTE — Progress Notes (Signed)
Pulmonary Critical Care Medicine [place]   PULMONARY CRITICAL CARE SERVICE  PROGRESS NOTE  Date of Service: 09/13/2018  Benjamin Montoya  [id number]:[id number]  DOB: [birth year]   DOA: 08/27/2018  Referring Physician: Merton Border, MD  HPI: Benjamin Montoya is a 48 y.o. male seen for follow up of Acute on Chronic Respiratory Failure.  Patient currently is on T collar has been on 28% FiO2 the goal is to wean for 48 hours today  Medications: Reviewed on Rounds  Physical Exam:  Vitals: Temperature 97.0 pulse 75 respiratory rate 15 blood pressure 100/55 saturations 96%  Ventilator Settings off the ventilator right now on T collar  . General: Comfortable at this time . Eyes: Grossly normal lids, irises & conjunctiva . ENT: grossly tongue is normal . Neck: no obvious mass . Cardiovascular: S1 S2 normal no gallop . Respiratory: No rhonchi no rales are noted . Abdomen: soft . Skin: no rash seen on limited exam . Musculoskeletal: not rigid . Psychiatric:unable to assess . Neurologic: no seizure no involuntary movements         Lab Data:   Basic Metabolic Panel: Recent Labs  Lab 09/07/18 0803  NA 134*  K 4.7  CL 95*  CO2 28  GLUCOSE 113*  BUN 15  CREATININE 0.54*  CALCIUM 9.5    ABG: No results for input(s): PHART, PCO2ART, PO2ART, HCO3, O2SAT in the last 168 hours.  Liver Function Tests: No results for input(s): AST, ALT, ALKPHOS, BILITOT, PROT, ALBUMIN in the last 168 hours. No results for input(s): LIPASE, AMYLASE in the last 168 hours. No results for input(s): AMMONIA in the last 168 hours.  CBC: Recent Labs  Lab 09/07/18 0803 09/09/18 1322 09/12/18 1612  WBC 16.2* 16.7*  --   NEUTROABS  --  13.0*  --   HGB 9.9* 9.9* 9.0*  HCT 32.4* 32.4* 29.5*  MCV 87.8 86.4  --   PLT 418* 369  --     Cardiac Enzymes: No results for input(s): CKTOTAL, CKMB, CKMBINDEX, TROPONINI in the last 168 hours.  BNP (last 3 results) No results for input(s): BNP in  the last 8760 hours.  ProBNP (last 3 results) No results for input(s): PROBNP in the last 8760 hours.  Radiological Exams: Ir Gastrostomy Tube Mod Sed  Result Date: 09/13/2018 CLINICAL DATA:  Acute transverse myelitis, Healthcare associated pneumonia, needs enteral feeding support EXAM: PERC PLACEMENT GASTROSTOMY FLUOROSCOPY TIME:  7.3 minute; 2337 uGym2 DAP TECHNIQUE: The procedure, risks, benefits, and alternatives were explained to the family. Questions regarding the procedure were encouraged and answered. The patient understands and consents to the procedure. As antibiotic prophylaxis, cefazolin 2 g was ordered pre-procedure and administered intravenously within one hour of incision. A safe percutaneous approach for gastrostomy was confirmed on recent CT. A 5 French angiographic catheter was placed as orogastric tube. The upper abdomen was prepped with Betadine, draped in usual sterile fashion, and infiltrated locally with 1% lidocaine. Intravenous Fentanyl 143mcg and Versed 2mg  were administered as conscious sedation during continuous monitoring of the patient's level of consciousness and physiological / cardiorespiratory status by the radiology RN, with a total moderate sedation time of 17 minutes. 0.5 mg glucagon given IV to facilitate gastric distention.Stomach was insufflated using air through the orogastric tube. An 28 French sheath needle was advanced percutaneously into the gastric lumen under fluoroscopy. Gas could be aspirated and a small contrast injection confirmed intraluminal spread. The sheath was exchanged over a guidewire for a 9 [place] vascular sheath, through  which the snare device was advanced and used to snare a guidewire passed through the orogastric tube. This was withdrawn, and the snare attached to the 24 French pull-through gastrostomy tube, which was advanced antegrade, positioned with the internal bumper securing the anterior gastric wall to the anterior abdominal wall. Small  contrast injection confirms appropriate positioning. The external bumper was applied and the catheter was flushed. COMPLICATIONS: COMPLICATIONS none IMPRESSION: 1. Technically successful 24 French pull-through gastrostomy placement under fluoroscopy. Electronically Signed   By: Corlis Leak  Hassell M.D.   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 Acute on chronic respiratory failure with hypoxia we will continue with T collar trials titrate oxygen continue pulmonary toilet 2. Acute transverse myelitis noted to be unchanged we will continue with supportive care 3. Healthcare associated pneumonia treated we will continue to follow 4. Pulmonary embolism treated   I have personally seen and evaluated the patient, evaluated laboratory and imaging results, formulated the assessment and plan and placed orders. The Patient requires high complexity decision making for assessment and support.  Case was discussed on Rounds with the Respiratory Therapy Staff  Yevonne PaxSaadat A Arshawn Valdez, MD [facility] Pulmonary Critical Care Medicine Sleep Medicine

## 2018-09-13 NOTE — Sedation Documentation (Signed)
Patient has trach collar. ETCO2 unable to be monitered

## 2018-09-14 DIAGNOSIS — G373 Acute transverse myelitis in demyelinating disease of central nervous system: Secondary | ICD-10-CM | POA: Diagnosis not present

## 2018-09-14 DIAGNOSIS — J189 Pneumonia, unspecified organism: Secondary | ICD-10-CM | POA: Diagnosis not present

## 2018-09-14 DIAGNOSIS — J9621 Acute and chronic respiratory failure with hypoxia: Secondary | ICD-10-CM | POA: Diagnosis not present

## 2018-09-14 DIAGNOSIS — I2699 Other pulmonary embolism without acute cor pulmonale: Secondary | ICD-10-CM | POA: Diagnosis not present

## 2018-09-14 LAB — CBC
HCT: 30.2 % — ABNORMAL LOW (ref 39.0–52.0)
Hemoglobin: 9 g/dL — ABNORMAL LOW (ref 13.0–17.0)
MCH: 26.3 pg (ref 26.0–34.0)
MCHC: 29.8 g/dL — ABNORMAL LOW (ref 30.0–36.0)
MCV: 88.3 fL (ref 80.0–100.0)
Platelets: 348 10*3/uL (ref 150–400)
RBC: 3.42 MIL/uL — ABNORMAL LOW (ref 4.22–5.81)
RDW: 15.8 % — ABNORMAL HIGH (ref 11.5–15.5)
WBC: 12.9 10*3/uL — ABNORMAL HIGH (ref 4.0–10.5)
nRBC: 0 % (ref 0.0–0.2)

## 2018-09-14 LAB — BASIC METABOLIC PANEL
Anion gap: 10 (ref 5–15)
BUN: 17 mg/dL (ref 6–20)
CO2: 28 mmol/L (ref 22–32)
Calcium: 9.8 mg/dL (ref 8.9–10.3)
Chloride: 99 mmol/L (ref 98–111)
Creatinine, Ser: <0.3 mg/dL — ABNORMAL LOW (ref 0.61–1.24)
Glucose, Bld: 97 mg/dL (ref 70–99)
Potassium: 4.3 mmol/L (ref 3.5–5.1)
Sodium: 137 mmol/L (ref 135–145)

## 2018-09-14 LAB — HEPARIN LEVEL (UNFRACTIONATED): Heparin Unfractionated: 0.12 IU/mL — ABNORMAL LOW (ref 0.30–0.70)

## 2018-09-14 LAB — PROTIME-INR
INR: 1.2 (ref 0.8–1.2)
Prothrombin Time: 15.3 seconds — ABNORMAL HIGH (ref 11.4–15.2)

## 2018-09-14 NOTE — Progress Notes (Signed)
Pulmonary Critical Care Medicine [place]   PULMONARY CRITICAL CARE SERVICE  PROGRESS NOTE  Date of Service: 09/14/2018  Benjamin Montoya  [id number]:[id number]  DOB: Mar 08, 1971   DOA: 08/27/2018  Referring Physician: Merton Border, MD  HPI: Benjamin Montoya is a 48 y.o. male seen for follow up of Acute on Chronic Respiratory Failure.  Patient right now is on T collar has been using the PMV also.  Right now patient is on 28% FiO2  Medications: Reviewed on Rounds  Physical Exam:  Vitals: Temperature 96.5 pulse 85 respiratory 13 blood pressure 110/64 saturations 99%  Ventilator Settings off the ventilator on T collar  . General: Comfortable at this time . Eyes: Grossly normal lids, irises & conjunctiva . ENT: grossly tongue is normal . Neck: no obvious mass . Cardiovascular: S1 S2 normal no gallop . Respiratory: No rhonchi or rales . Abdomen: soft . Skin: no rash seen on limited exam . Musculoskeletal: not rigid . Psychiatric:unable to assess . Neurologic: no seizure no involuntary movements         Lab Data:   Basic Metabolic Panel: Recent Labs  Lab 09/14/18 0741  NA 137  K 4.3  CL 99  CO2 28  GLUCOSE 97  BUN 17  CREATININE <0.30*  CALCIUM 9.8    ABG: No results for input(s): PHART, PCO2ART, PO2ART, HCO3, O2SAT in the last 168 hours.  Liver Function Tests: No results for input(s): AST, ALT, ALKPHOS, BILITOT, PROT, ALBUMIN in the last 168 hours. No results for input(s): LIPASE, AMYLASE in the last 168 hours. No results for input(s): AMMONIA in the last 168 hours.  CBC: Recent Labs  Lab 09/09/18 1322 09/12/18 1612 09/14/18 0741  WBC 16.7*  --  12.9*  NEUTROABS 13.0*  --   --   HGB 9.9* 9.0* 9.0*  HCT 32.4* 29.5* 30.2*  MCV 86.4  --  88.3  PLT 369  --  348    Cardiac Enzymes: No results for input(s): CKTOTAL, CKMB, CKMBINDEX, TROPONINI in the last 168 hours.  BNP (last 3 results) No results for input(s): BNP in the last 8760  hours.  ProBNP (last 3 results) No results for input(s): PROBNP in the last 8760 hours.  Radiological Exams: Ir Gastrostomy Tube Mod Sed  Result Date: 09/13/2018 CLINICAL DATA:  Acute transverse myelitis, Healthcare associated pneumonia, needs enteral feeding support EXAM: PERC PLACEMENT GASTROSTOMY FLUOROSCOPY TIME:  7.3 minute; 2337 uGym2 DAP TECHNIQUE: The procedure, risks, benefits, and alternatives were explained to the family. Questions regarding the procedure were encouraged and answered. The patient understands and consents to the procedure. As antibiotic prophylaxis, cefazolin 2 g was ordered pre-procedure and administered intravenously within one hour of incision. A safe percutaneous approach for gastrostomy was confirmed on recent CT. A 5 French angiographic catheter was placed as orogastric tube. The upper abdomen was prepped with Betadine, draped in usual sterile fashion, and infiltrated locally with 1% lidocaine. Intravenous Fentanyl 159mcg and Versed 2mg  were administered as conscious sedation during continuous monitoring of the patient's level of consciousness and physiological / cardiorespiratory status by the radiology RN, with a total moderate sedation time of 17 minutes. 0.5 mg glucagon given IV to facilitate gastric distention.Stomach was insufflated using air through the orogastric tube. An 20 French sheath needle was advanced percutaneously into the gastric lumen under fluoroscopy. Gas could be aspirated and a small contrast injection confirmed intraluminal spread. The sheath was exchanged over a guidewire for a 9 [place] vascular sheath, through which the snare device  was advanced and used to snare a guidewire passed through the orogastric tube. This was withdrawn, and the snare attached to the 24 French pull-through gastrostomy tube, which was advanced antegrade, positioned with the internal bumper securing the anterior gastric wall to the anterior abdominal wall. Small contrast  injection confirms appropriate positioning. The external bumper was applied and the catheter was flushed. COMPLICATIONS: COMPLICATIONS none IMPRESSION: 1. Technically successful 24 French pull-through gastrostomy placement under fluoroscopy. Electronically Signed   By: Corlis Leak  Hassell M.D.   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 Acute on chronic respiratory failure with hypoxia we will continue with the T collar wean advance PMV as tolerated 2. Transverse myelitis grossly unchanged we will continue with supportive care and physical therapy as tolerated 3. Healthcare associated pneumonia treated continue to monitor. 4. Pulmonary embolism anticoagulation as tolerated continue with supportive care no sign of bleeding at this time   I have personally seen and evaluated the patient, evaluated laboratory and imaging results, formulated the assessment and plan and placed orders. The Patient requires high complexity decision making for assessment and support.  Case was discussed on Rounds with the Respiratory Therapy Staff  Yevonne PaxSaadat A Reino Lybbert, MD [facility] Pulmonary Critical Care Medicine Sleep Medicine

## 2018-09-15 DIAGNOSIS — J9621 Acute and chronic respiratory failure with hypoxia: Secondary | ICD-10-CM | POA: Diagnosis not present

## 2018-09-15 DIAGNOSIS — I2699 Other pulmonary embolism without acute cor pulmonale: Secondary | ICD-10-CM | POA: Diagnosis not present

## 2018-09-15 DIAGNOSIS — J189 Pneumonia, unspecified organism: Secondary | ICD-10-CM | POA: Diagnosis not present

## 2018-09-15 DIAGNOSIS — G373 Acute transverse myelitis in demyelinating disease of central nervous system: Secondary | ICD-10-CM | POA: Diagnosis not present

## 2018-09-15 LAB — C DIFFICILE QUICK SCREEN W PCR REFLEX
C Diff antigen: NEGATIVE
C Diff interpretation: NOT DETECTED
C Diff toxin: NEGATIVE

## 2018-09-15 LAB — HEPARIN LEVEL (UNFRACTIONATED)
Heparin Unfractionated: <0.1 IU/mL — ABNORMAL LOW (ref 0.30–0.70)
Heparin Unfractionated: 0.18 IU/mL — ABNORMAL LOW (ref 0.30–0.70)

## 2018-09-15 LAB — PROTIME-INR
INR: 1.4 — ABNORMAL HIGH (ref 0.8–1.2)
Prothrombin Time: 17.2 seconds — ABNORMAL HIGH (ref 11.4–15.2)

## 2018-09-15 NOTE — Progress Notes (Addendum)
Pulmonary Critical Care Medicine [place]   PULMONARY CRITICAL CARE SERVICE  PROGRESS NOTE  Date of Service: 09/15/2018  Benjamin Montoya  [id number]:[id number]  DOB: 1970/05/26   DOA: 08/27/2018  Referring Physician: Merton Border, MD  HPI: Benjamin Montoya is a 48 y.o. male seen for follow up of Acute on Chronic Respiratory Failure.  Remains on aerosol trach collar 20% FiO2 using PMV without difficulty.  Patient's trach downsized to a #6 XLT cuffless today.  Medications: Reviewed on Rounds  Physical Exam:  Vitals: Pulse 94 respirations 12 BP 121/61 O2 sat 94% temp 98.1  Ventilator Settings AC 28%  . General: Comfortable at this time . Eyes: Grossly normal lids, irises & conjunctiva . ENT: grossly tongue is normal . Neck: no obvious mass . Cardiovascular: S1 S2 normal no gallop . Respiratory: No rales rhonchi noted . Abdomen: soft . Skin: no rash seen on limited exam . Musculoskeletal: not rigid . Psychiatric:unable to assess . Neurologic: no seizure no involuntary movements         Lab Data:   Basic Metabolic Panel: Recent Labs  Lab 09/14/18 0741  NA 137  K 4.3  CL 99  CO2 28  GLUCOSE 97  BUN 17  CREATININE <0.30*  CALCIUM 9.8    ABG: No results for input(s): PHART, PCO2ART, PO2ART, HCO3, O2SAT in the last 168 hours.  Liver Function Tests: No results for input(s): AST, ALT, ALKPHOS, BILITOT, PROT, ALBUMIN in the last 168 hours. No results for input(s): LIPASE, AMYLASE in the last 168 hours. No results for input(s): AMMONIA in the last 168 hours.  CBC: Recent Labs  Lab 09/09/18 1322 09/12/18 1612 09/14/18 0741  WBC 16.7*  --  12.9*  NEUTROABS 13.0*  --   --   HGB 9.9* 9.0* 9.0*  HCT 32.4* 29.5* 30.2*  MCV 86.4  --  88.3  PLT 369  --  348    Cardiac Enzymes: No results for input(s): CKTOTAL, CKMB, CKMBINDEX, TROPONINI in the last 168 hours.  BNP (last 3 results) No results for input(s): BNP in the last 8760 hours.  ProBNP (last 3  results) No results for input(s): PROBNP in the last 8760 hours.  Radiological Exams: No results found.  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 Acute on chronic respiratory failure with hypoxia patient will continue to use aerosol trach collar 28% FiO2 with PMV as tolerated.  Trach downsized to #6 XLT cuffless today.  Continue supportive measures and pulmonary toilet. 2. Transverse myelitis grossly unchanged we will continue with supportive care and physical therapy as tolerated 3. Healthcare associated pneumonia treated continue to monitor. 4. Pulmonary embolism anticoagulation as tolerated continue with supportive care no sign of bleeding at this time   I have personally seen and evaluated the patient, evaluated laboratory and imaging results, formulated the assessment and plan and placed orders. The Patient requires high complexity decision making for assessment and support.  Case was discussed on Rounds with the Respiratory Therapy Staff  Allyne Gee, MD [facility] Pulmonary Critical Care Medicine Sleep Medicine

## 2018-09-16 DIAGNOSIS — J189 Pneumonia, unspecified organism: Secondary | ICD-10-CM | POA: Diagnosis not present

## 2018-09-16 DIAGNOSIS — G373 Acute transverse myelitis in demyelinating disease of central nervous system: Secondary | ICD-10-CM | POA: Diagnosis not present

## 2018-09-16 DIAGNOSIS — J9621 Acute and chronic respiratory failure with hypoxia: Secondary | ICD-10-CM | POA: Diagnosis not present

## 2018-09-16 DIAGNOSIS — I2699 Other pulmonary embolism without acute cor pulmonale: Secondary | ICD-10-CM | POA: Diagnosis not present

## 2018-09-16 LAB — PROTIME-INR
INR: 1.6 — ABNORMAL HIGH (ref 0.8–1.2)
Prothrombin Time: 19 seconds — ABNORMAL HIGH (ref 11.4–15.2)

## 2018-09-16 LAB — HEPARIN LEVEL (UNFRACTIONATED)
Heparin Unfractionated: 0.24 IU/mL — ABNORMAL LOW (ref 0.30–0.70)
Heparin Unfractionated: 0.24 IU/mL — ABNORMAL LOW (ref 0.30–0.70)

## 2018-09-16 NOTE — Progress Notes (Signed)
Pulmonary Critical Care Medicine [place]   PULMONARY CRITICAL CARE SERVICE  PROGRESS NOTE  Date of Service: 09/16/2018  Mihail Prettyman  OQH:[id number]  DOB: 20-Sep-1970   DOA: 08/27/2018  Referring Physician: Merton Border, MD  HPI: Benjamin Montoya is a 48 y.o. male seen for follow up of Acute on Chronic Respiratory Failure.  He is doing relatively well on T collar daily saturations are noted patient is also been tolerating the PMV  Medications: Reviewed on Rounds  Physical Exam:  Vitals: Temperature 99.5 pulse 112 respiratory 18 blood pressure 112/50 saturations 92%  Ventilator Settings off the ventilator right now on T collar  . General: Comfortable at this time . Eyes: Grossly normal lids, irises & conjunctiva . ENT: grossly tongue is normal . Neck: no obvious mass . Cardiovascular: S1 S2 normal no gallop . Respiratory: No rhonchi or rales are noted . Abdomen: soft . Skin: no rash seen on limited exam . Musculoskeletal: not rigid . Psychiatric:unable to assess . Neurologic: no seizure no involuntary movements         Lab Data:   Basic Metabolic Panel: Recent Labs  Lab 09/14/18 0741  NA 137  K 4.3  CL 99  CO2 28  GLUCOSE 97  BUN 17  CREATININE <0.30*  CALCIUM 9.8    ABG: No results for input(s): PHART, PCO2ART, PO2ART, HCO3, O2SAT in the last 168 hours.  Liver Function Tests: No results for input(s): AST, ALT, ALKPHOS, BILITOT, PROT, ALBUMIN in the last 168 hours. No results for input(s): LIPASE, AMYLASE in the last 168 hours. No results for input(s): AMMONIA in the last 168 hours.  CBC: Recent Labs  Lab 09/09/18 1322 09/12/18 1612 09/14/18 0741  WBC 16.7*  --  12.9*  NEUTROABS 13.0*  --   --   HGB 9.9* 9.0* 9.0*  HCT 32.4* 29.5* 30.2*  MCV 86.4  --  88.3  PLT 369  --  348    Cardiac Enzymes: No results for input(s): CKTOTAL, CKMB, CKMBINDEX, TROPONINI in the last 168 hours.  BNP (last 3 results) No results for input(s):  BNP in the last 8760 hours.  ProBNP (last 3 results) No results for input(s): PROBNP in the last 8760 hours.  Radiological Exams: No results found.  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 Acute on chronic respiratory failure with hypoxia continue with T collar trials continue secretion management supportive care. 2. Acute transverse myelitis patient is at baseline 3. Healthcare associated pneumonia treated we will continue to follow 4. Pulmonary embolism treated continue with supportive care   I have personally seen and evaluated the patient, evaluated laboratory and imaging results, formulated the assessment and plan and placed orders. The Patient requires high complexity decision making for assessment and support.  Case was discussed on Rounds with the Respiratory Therapy Staff  Allyne Gee, MD [facility] Pulmonary Critical Care Medicine Sleep Medicine

## 2018-09-17 ENCOUNTER — Institutional Professional Consult (permissible substitution) (HOSPITAL_COMMUNITY): Payer: Self-pay

## 2018-09-17 ENCOUNTER — Other Ambulatory Visit (HOSPITAL_COMMUNITY): Payer: Self-pay

## 2018-09-17 DIAGNOSIS — J9621 Acute and chronic respiratory failure with hypoxia: Secondary | ICD-10-CM | POA: Diagnosis not present

## 2018-09-17 DIAGNOSIS — G373 Acute transverse myelitis in demyelinating disease of central nervous system: Secondary | ICD-10-CM | POA: Diagnosis not present

## 2018-09-17 DIAGNOSIS — I2699 Other pulmonary embolism without acute cor pulmonale: Secondary | ICD-10-CM | POA: Diagnosis not present

## 2018-09-17 DIAGNOSIS — J189 Pneumonia, unspecified organism: Secondary | ICD-10-CM | POA: Diagnosis not present

## 2018-09-17 LAB — PROTIME-INR
INR: 1.7 — ABNORMAL HIGH (ref 0.8–1.2)
Prothrombin Time: 19.8 seconds — ABNORMAL HIGH (ref 11.4–15.2)

## 2018-09-17 LAB — HEPARIN LEVEL (UNFRACTIONATED)
Heparin Unfractionated: 0.37 IU/mL (ref 0.30–0.70)
Heparin Unfractionated: 0.34 IU/mL (ref 0.30–0.70)

## 2018-09-17 LAB — CBC
HCT: 32.3 % — ABNORMAL LOW (ref 39.0–52.0)
Hemoglobin: 9.7 g/dL — ABNORMAL LOW (ref 13.0–17.0)
MCH: 26.2 pg (ref 26.0–34.0)
MCHC: 30 g/dL (ref 30.0–36.0)
MCV: 87.3 fL (ref 80.0–100.0)
Platelets: 381 10*3/uL (ref 150–400)
RBC: 3.7 MIL/uL — ABNORMAL LOW (ref 4.22–5.81)
RDW: 16 % — ABNORMAL HIGH (ref 11.5–15.5)
WBC: 21.6 10*3/uL — ABNORMAL HIGH (ref 4.0–10.5)
nRBC: 0 % (ref 0.0–0.2)

## 2018-09-17 NOTE — Progress Notes (Signed)
Pulmonary Critical Care Medicine [place]   PULMONARY CRITICAL CARE SERVICE  PROGRESS NOTE  Date of Service: 09/17/2018  Benjamin Montoya  [id number]  DOB: Jul 13, 1970   DOA: 08/27/2018  Referring Physician: Merton Border, MD  HPI: Benjamin Montoya is a 48 y.o. male seen for follow up of Acute on Chronic Respiratory Failure.  Patient currently is on T collar has been on 28% FiO2 with good saturations noted  Medications: Reviewed on Rounds  Physical Exam:  Vitals: Temperature 100.9 pulse 107 respiratory 11 blood pressure 121/55 saturations 92%  Ventilator Settings off the ventilator on T collar  . General: Comfortable at this time . Eyes: Grossly normal lids, irises & conjunctiva . ENT: grossly tongue is normal . Neck: no obvious mass . Cardiovascular: S1 S2 normal no gallop . Respiratory: No rhonchi no rales are noted at this time . Abdomen: soft . Skin: no rash seen on limited exam . Musculoskeletal: not rigid . Psychiatric:unable to assess . Neurologic: no seizure no involuntary movements         Lab Data:   Basic Metabolic Panel: Recent Labs  Lab 09/14/18 0741  NA 137  K 4.3  CL 99  CO2 28  GLUCOSE 97  BUN 17  CREATININE <0.30*  CALCIUM 9.8    ABG: No results for input(s): PHART, PCO2ART, PO2ART, HCO3, O2SAT in the last 168 hours.  Liver Function Tests: No results for input(s): AST, ALT, ALKPHOS, BILITOT, PROT, ALBUMIN in the last 168 hours. No results for input(s): LIPASE, AMYLASE in the last 168 hours. No results for input(s): AMMONIA in the last 168 hours.  CBC: Recent Labs  Lab 09/12/18 1612 09/14/18 0741 09/17/18 0735  WBC  --  12.9* 21.6*  HGB 9.0* 9.0* 9.7*  HCT 29.5* 30.2* 32.3*  MCV  --  88.3 87.3  PLT  --  348 381    Cardiac Enzymes: No results for input(s): CKTOTAL, CKMB, CKMBINDEX, TROPONINI in the last 168 hours.  BNP (last 3 results) No results for input(s): BNP in the last 8760 hours.  ProBNP (last 3  results) No results for input(s): PROBNP in the last 8760 hours.  Radiological Exams: No results found.  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 Acute on chronic respiratory failure with hypoxia continue with on T collar trials titrate oxygen continue pulmonary toilet.  Patient is tolerating PMV fairly well 2. Acute transverse myelitis patient is at baseline continue with therapy 3. Healthcare associated pneumonia treated improving 4. Pulmonary embolism treated   I have personally seen and evaluated the patient, evaluated laboratory and imaging results, formulated the assessment and plan and placed orders. The Patient requires high complexity decision making for assessment and support.  Case was discussed on Rounds with the Respiratory Therapy Staff  Allyne Gee, MD [facility] Pulmonary Critical Care Medicine Sleep Medicine

## 2018-09-18 DIAGNOSIS — J9621 Acute and chronic respiratory failure with hypoxia: Secondary | ICD-10-CM | POA: Diagnosis not present

## 2018-09-18 DIAGNOSIS — J189 Pneumonia, unspecified organism: Secondary | ICD-10-CM | POA: Diagnosis not present

## 2018-09-18 DIAGNOSIS — I2699 Other pulmonary embolism without acute cor pulmonale: Secondary | ICD-10-CM | POA: Diagnosis not present

## 2018-09-18 DIAGNOSIS — G373 Acute transverse myelitis in demyelinating disease of central nervous system: Secondary | ICD-10-CM | POA: Diagnosis not present

## 2018-09-18 LAB — PROTIME-INR
INR: 2.1 — ABNORMAL HIGH (ref 0.8–1.2)
Prothrombin Time: 23.3 seconds — ABNORMAL HIGH (ref 11.4–15.2)

## 2018-09-18 LAB — HEPARIN LEVEL (UNFRACTIONATED)
Heparin Unfractionated: 0.25 IU/mL — ABNORMAL LOW (ref 0.30–0.70)
Heparin Unfractionated: 0.31 IU/mL (ref 0.30–0.70)

## 2018-09-18 NOTE — Progress Notes (Addendum)
Pulmonary Critical Care Medicine [place]   PULMONARY CRITICAL CARE SERVICE  PROGRESS NOTE  Date of Service: 09/18/2018  Zechariah Bissonnette  KZS:[id number]  DOB: [birth year]   DOA: 08/27/2018  Referring Physician: Merton Border, MD  HPI: Rito Lecomte is a 48 y.o. male seen for follow up of Acute on Chronic Respiratory Failure.  Patient continues on aerosol trach collar a 5% FiO2 using PMV with no difficulty.  Medications: Reviewed on Rounds  Physical Exam:  Vitals: Pulse 88 respirations 15 BP 100/56 O2 sat 96% temp 99.7  Ventilator Settings ATC 35%  . General: Comfortable at this time . Eyes: Grossly normal lids, irises & conjunctiva . ENT: grossly tongue is normal . Neck: no obvious mass . Cardiovascular: S1 S2 normal no gallop . Respiratory: No rales or rhonchi noted . Abdomen: soft . Skin: no rash seen on limited exam . Musculoskeletal: not rigid . Psychiatric:unable to assess . Neurologic: no seizure no involuntary movements         Lab Data:   Basic Metabolic Panel: Recent Labs  Lab 09/14/18 0741  NA 137  K 4.3  CL 99  CO2 28  GLUCOSE 97  BUN 17  CREATININE <0.30*  CALCIUM 9.8    ABG: No results for input(s): PHART, PCO2ART, PO2ART, HCO3, O2SAT in the last 168 hours.  Liver Function Tests: No results for input(s): AST, ALT, ALKPHOS, BILITOT, PROT, ALBUMIN in the last 168 hours. No results for input(s): LIPASE, AMYLASE in the last 168 hours. No results for input(s): AMMONIA in the last 168 hours.  CBC: Recent Labs  Lab 09/12/18 1612 09/14/18 0741 09/17/18 0735  WBC  --  12.9* 21.6*  HGB 9.0* 9.0* 9.7*  HCT 29.5* 30.2* 32.3*  MCV  --  88.3 87.3  PLT  --  348 381    Cardiac Enzymes: No results for input(s): CKTOTAL, CKMB, CKMBINDEX, TROPONINI in the last 168 hours.  BNP (last 3 results) No results for input(s): BNP in the last 8760 hours.  ProBNP (last 3 results) No results for input(s): PROBNP in the last 8760  hours.  Radiological Exams: Dg Chest Port 1 View  Result Date: 09/17/2018 CLINICAL DATA:  Respiratory failure. EXAM: PORTABLE CHEST 1 VIEW COMPARISON:  09/07/2018. FINDINGS: Tracheostomy tube noted with tip over trachea. Stable cardiomegaly. Interval removal of feeding tube. Stable cardiomegaly. Unchanged right pleural effusion. Underlying right base pulmonary infiltrate may be present. Bibasilar atelectasis. Interim slight improvement in aeration from prior exam. No pneumothorax. Degenerative change thoracic spine IMPRESSION: 1. Interim removal of feeding tube. Tracheostomy tube in stable position. 2.  Stable cardiomegaly. 3. Persistent right-sided pleural effusion. Underlying pulmonary infiltrate cannot be excluded. Bibasilar atelectasis again noted. Interim slight improvement in aeration from prior exam. 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 Acute on chronic respiratory failure with hypoxia continue with aerosol trach collar trials and PMV use.  Continue supportive measures and pulmonary toilet. 2. Acute transverse myelitis patient is at baseline continue with therapy 3. Healthcare associated pneumonia treated improving 4. Pulmonary embolism treated   I have personally seen and evaluated the patient, evaluated laboratory and imaging results, formulated the assessment and plan and placed orders. The Patient requires high complexity decision making for assessment  and support.  Case was discussed on Rounds with the Respiratory Therapy Staff  Allyne Gee, MD [facility] Pulmonary Critical Care Medicine Sleep Medicine

## 2018-09-19 DIAGNOSIS — G373 Acute transverse myelitis in demyelinating disease of central nervous system: Secondary | ICD-10-CM | POA: Diagnosis not present

## 2018-09-19 DIAGNOSIS — J9621 Acute and chronic respiratory failure with hypoxia: Secondary | ICD-10-CM | POA: Diagnosis not present

## 2018-09-19 DIAGNOSIS — J189 Pneumonia, unspecified organism: Secondary | ICD-10-CM | POA: Diagnosis not present

## 2018-09-19 DIAGNOSIS — I2699 Other pulmonary embolism without acute cor pulmonale: Secondary | ICD-10-CM | POA: Diagnosis not present

## 2018-09-19 LAB — CULTURE, RESPIRATORY W GRAM STAIN: Culture: NO GROWTH

## 2018-09-19 LAB — PROTIME-INR
INR: 2.2 — ABNORMAL HIGH (ref 0.8–1.2)
Prothrombin Time: 24.4 seconds — ABNORMAL HIGH (ref 11.4–15.2)

## 2018-09-19 LAB — HEPARIN LEVEL (UNFRACTIONATED): Heparin Unfractionated: <0.1 IU/mL — ABNORMAL LOW (ref 0.30–0.70)

## 2018-09-19 NOTE — Progress Notes (Addendum)
Pulmonary Critical Care Medicine [place]   PULMONARY CRITICAL CARE SERVICE  PROGRESS NOTE  Date of Service: 09/19/2018  Benjamin Montoya  MCN:[id number]  DOB: [birth year]   DOA: 08/27/2018  Referring Physician: Merton Border, MD  HPI: Benjamin Montoya is a 48 y.o. male seen for follow up of Acute on Chronic Respiratory Failure.  Patient seen 35% aerosol trach collar using PMV without difficulty satting well with no distress.  Medications: Reviewed on Rounds  Physical Exam:  Vitals: Pulse 88 respirations 11 BP 96/53 O2 sat 97% temp 99.1  Ventilator Settings ATC 35%  . General: Comfortable at this time . Eyes: Grossly normal lids, irises & conjunctiva . ENT: grossly tongue is normal . Neck: no obvious mass . Cardiovascular: S1 S2 normal no gallop . Respiratory: No rales or rhonchi noted . Abdomen: soft . Skin: no rash seen on limited exam . Musculoskeletal: not rigid . Psychiatric:unable to assess . Neurologic: no seizure no involuntary movements         Lab Data:   Basic Metabolic Panel: Recent Labs  Lab 09/14/18 0741  NA 137  K 4.3  CL 99  CO2 28  GLUCOSE 97  BUN 17  CREATININE <0.30*  CALCIUM 9.8    ABG: No results for input(s): PHART, PCO2ART, PO2ART, HCO3, O2SAT in the last 168 hours.  Liver Function Tests: No results for input(s): AST, ALT, ALKPHOS, BILITOT, PROT, ALBUMIN in the last 168 hours. No results for input(s): LIPASE, AMYLASE in the last 168 hours. No results for input(s): AMMONIA in the last 168 hours.  CBC: Recent Labs  Lab 09/14/18 0741 09/17/18 0735  WBC 12.9* 21.6*  HGB 9.0* 9.7*  HCT 30.2* 32.3*  MCV 88.3 87.3  PLT 348 381    Cardiac Enzymes: No results for input(s): CKTOTAL, CKMB, CKMBINDEX, TROPONINI in the last 168 hours.  BNP (last 3 results) No results for input(s): BNP in the last 8760 hours.  ProBNP (last 3 results) No results for input(s): PROBNP in the last 8760 hours.  Radiological Exams: No  results found.  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 Acute on chronic respiratory failure with hypoxia continue with aerosol trach collar trials and PMV use.  Continue supportive measures and pulmonary toilet. 2. Acute transverse myelitis patient is at baseline continue with therapy 3. Healthcare associated pneumonia treated improving 4. Pulmonary embolism treated   I have personally seen and evaluated the patient, evaluated laboratory and imaging results, formulated the assessment and plan and placed orders. The Patient requires high complexity decision making for assessment and support.  Case was discussed on Rounds with the Respiratory Therapy Staff  Allyne Gee, MD [facility] Pulmonary Critical Care Medicine Sleep Medicine

## 2018-09-20 DIAGNOSIS — J9621 Acute and chronic respiratory failure with hypoxia: Secondary | ICD-10-CM | POA: Diagnosis not present

## 2018-09-20 DIAGNOSIS — I2699 Other pulmonary embolism without acute cor pulmonale: Secondary | ICD-10-CM | POA: Diagnosis not present

## 2018-09-20 DIAGNOSIS — J189 Pneumonia, unspecified organism: Secondary | ICD-10-CM | POA: Diagnosis not present

## 2018-09-20 DIAGNOSIS — G373 Acute transverse myelitis in demyelinating disease of central nervous system: Secondary | ICD-10-CM | POA: Diagnosis not present

## 2018-09-20 LAB — BASIC METABOLIC PANEL
Anion gap: 12 (ref 5–15)
BUN: 11 mg/dL (ref 6–20)
CO2: 33 mmol/L — ABNORMAL HIGH (ref 22–32)
Calcium: 9.7 mg/dL (ref 8.9–10.3)
Chloride: 90 mmol/L — ABNORMAL LOW (ref 98–111)
Creatinine, Ser: 0.38 mg/dL — ABNORMAL LOW (ref 0.61–1.24)
GFR calc Af Amer: >60 mL/min (ref >60)
GFR calc non Af Amer: >60 mL/min (ref >60)
Glucose, Bld: 114 mg/dL — ABNORMAL HIGH (ref 70–99)
Potassium: 4.9 mmol/L (ref 3.5–5.1)
Sodium: 135 mmol/L (ref 135–145)

## 2018-09-20 LAB — CBC
HCT: 31.6 % — ABNORMAL LOW (ref 39.0–52.0)
Hemoglobin: 9.4 g/dL — ABNORMAL LOW (ref 13.0–17.0)
MCH: 25.7 pg — ABNORMAL LOW (ref 26.0–34.0)
MCHC: 29.7 g/dL — ABNORMAL LOW (ref 30.0–36.0)
MCV: 86.3 fL (ref 80.0–100.0)
Platelets: 418 10*3/uL — ABNORMAL HIGH (ref 150–400)
RBC: 3.66 MIL/uL — ABNORMAL LOW (ref 4.22–5.81)
RDW: 15.9 % — ABNORMAL HIGH (ref 11.5–15.5)
WBC: 11.5 10*3/uL — ABNORMAL HIGH (ref 4.0–10.5)
nRBC: 0 % (ref 0.0–0.2)

## 2018-09-20 LAB — PROTIME-INR
INR: 2.1 — ABNORMAL HIGH (ref 0.8–1.2)
Prothrombin Time: 22.9 seconds — ABNORMAL HIGH (ref 11.4–15.2)

## 2018-09-20 LAB — HEPARIN LEVEL (UNFRACTIONATED): Heparin Unfractionated: <0.1 IU/mL — ABNORMAL LOW (ref 0.30–0.70)

## 2018-09-20 NOTE — Progress Notes (Signed)
Pulmonary Critical Care Medicine [place]   PULMONARY CRITICAL CARE SERVICE  PROGRESS NOTE  Date of Service: 09/20/2018  Benjamin Montoya  [id number]  DOB: 1970/07/08   DOA: 08/27/2018  Referring Physician: Merton Border, MD  HPI: Benjamin Montoya is a 48 y.o. male seen for follow up of Acute on Chronic Respiratory Failure.  Patient currently is on T collar with the PMV right now is requiring 35% FiO2  Medications: Reviewed on Rounds  Physical Exam:  Vitals: Temperature 98.7 pulse 96 respiratory rate 16 blood pressure 107/59 saturations 96%  Ventilator Settings off the ventilator on T collar  . General: Comfortable at this time . Eyes: Grossly normal lids, irises & conjunctiva . ENT: grossly tongue is normal . Neck: no obvious mass . Cardiovascular: S1 S2 normal no gallop . Respiratory: No rhonchi no rales are noted . Abdomen: soft . Skin: no rash seen on limited exam . Musculoskeletal: not rigid . Psychiatric:unable to assess . Neurologic: no seizure no involuntary movements         Lab Data:   Basic Metabolic Panel: Recent Labs  Lab 09/14/18 0741 09/20/18 0738  NA 137 135  K 4.3 4.9  CL 99 90*  CO2 28 33*  GLUCOSE 97 114*  BUN 17 11  CREATININE <0.30* 0.38*  CALCIUM 9.8 9.7    ABG: No results for input(s): PHART, PCO2ART, PO2ART, HCO3, O2SAT in the last 168 hours.  Liver Function Tests: No results for input(s): AST, ALT, ALKPHOS, BILITOT, PROT, ALBUMIN in the last 168 hours. No results for input(s): LIPASE, AMYLASE in the last 168 hours. No results for input(s): AMMONIA in the last 168 hours.  CBC: Recent Labs  Lab 09/14/18 0741 09/17/18 0735 09/20/18 0738  WBC 12.9* 21.6* 11.5*  HGB 9.0* 9.7* 9.4*  HCT 30.2* 32.3* 31.6*  MCV 88.3 87.3 86.3  PLT 348 381 418*    Cardiac Enzymes: No results for input(s): CKTOTAL, CKMB, CKMBINDEX, TROPONINI in the last 168 hours.  BNP (last 3 results) No results for input(s): BNP in the  last 8760 hours.  ProBNP (last 3 results) No results for input(s): PROBNP in the last 8760 hours.  Radiological Exams: No results found.  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 Acute on chronic respiratory failure with hypoxia we will continue with the T collar wean and placed on PMV as tolerated 2. Acute transverse myelitis at baseline continue with supportive care 3. Healthcare associated pneumonia treated clinically improved 4. Pulmonary embolism treated we will continue to follow   I have personally seen and evaluated the patient, evaluated laboratory and imaging results, formulated the assessment and plan and placed orders. The Patient requires high complexity decision making for assessment and support.  Case was discussed on Rounds with the Respiratory Therapy Staff  Allyne Gee, MD [facility] Pulmonary Critical Care Medicine Sleep Medicine

## 2018-09-21 DIAGNOSIS — J9621 Acute and chronic respiratory failure with hypoxia: Secondary | ICD-10-CM | POA: Diagnosis not present

## 2018-09-21 DIAGNOSIS — J189 Pneumonia, unspecified organism: Secondary | ICD-10-CM | POA: Diagnosis not present

## 2018-09-21 DIAGNOSIS — I2699 Other pulmonary embolism without acute cor pulmonale: Secondary | ICD-10-CM | POA: Diagnosis not present

## 2018-09-21 DIAGNOSIS — G373 Acute transverse myelitis in demyelinating disease of central nervous system: Secondary | ICD-10-CM | POA: Diagnosis not present

## 2018-09-21 LAB — PROTIME-INR
INR: 2.3 — ABNORMAL HIGH (ref 0.8–1.2)
Prothrombin Time: 25.1 seconds — ABNORMAL HIGH (ref 11.4–15.2)

## 2018-09-21 NOTE — Progress Notes (Addendum)
Pulmonary Critical Care Medicine [place]   PULMONARY CRITICAL CARE SERVICE  PROGRESS NOTE  Date of Service: 09/21/2018  Benjamin Montoya  [id number]  DOB: [birth year]   DOA: 08/27/2018  Referring Physician: Merton Border, MD  HPI: Benjamin Montoya is a 48 y.o. male seen for follow up of Acute on Chronic Respiratory Failure.  Patient continues on 30% aerosol trach collar using PMV with difficulty satting well no distress.  Medications: Reviewed on Rounds  Physical Exam:  Vitals: Pulse 106 (18 BP 135/72 O2 sat 93% temp 101.2  Ventilator Settings 35% ATC  . General: Comfortable at this time . Eyes: Grossly normal lids, irises & conjunctiva . ENT: grossly tongue is normal . Neck: no obvious mass . Cardiovascular: S1 S2 normal no gallop . Respiratory: No rales or rhonchi noted . Abdomen: soft . Skin: no rash seen on limited exam . Musculoskeletal: not rigid . Psychiatric:unable to assess . Neurologic: no seizure no involuntary movements         Lab Data:   Basic Metabolic Panel: Recent Labs  Lab 09/20/18 0738  NA 135  K 4.9  CL 90*  CO2 33*  GLUCOSE 114*  BUN 11  CREATININE 0.38*  CALCIUM 9.7    ABG: No results for input(s): PHART, PCO2ART, PO2ART, HCO3, O2SAT in the last 168 hours.  Liver Function Tests: No results for input(s): AST, ALT, ALKPHOS, BILITOT, PROT, ALBUMIN in the last 168 hours. No results for input(s): LIPASE, AMYLASE in the last 168 hours. No results for input(s): AMMONIA in the last 168 hours.  CBC: Recent Labs  Lab 09/17/18 0735 09/20/18 0738  WBC 21.6* 11.5*  HGB 9.7* 9.4*  HCT 32.3* 31.6*  MCV 87.3 86.3  PLT 381 418*    Cardiac Enzymes: No results for input(s): CKTOTAL, CKMB, CKMBINDEX, TROPONINI in the last 168 hours.  BNP (last 3 results) No results for input(s): BNP in the last 8760 hours.  ProBNP (last 3 results) No results for input(s): PROBNP in the last 8760 hours.  Radiological Exams: No results  found.  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 Acute on chronic respiratory failure with hypoxia we will continue with the T collar wean and placed on PMV as tolerated 2. Acute transverse myelitis at baseline continue with supportive care 3. Healthcare associated pneumonia treated clinically improved 4. Pulmonary embolism treated we will continue to follow   I have personally seen and evaluated the patient, evaluated laboratory and imaging results, formulated the assessment and plan and placed orders. The Patient requires high complexity decision making for assessment and support.  Case was discussed on Rounds with the Respiratory Therapy Staff  Allyne Gee, MD [facility] Pulmonary Critical Care Medicine Sleep Medicine

## 2018-09-22 DIAGNOSIS — I2699 Other pulmonary embolism without acute cor pulmonale: Secondary | ICD-10-CM | POA: Diagnosis not present

## 2018-09-22 DIAGNOSIS — J9621 Acute and chronic respiratory failure with hypoxia: Secondary | ICD-10-CM | POA: Diagnosis not present

## 2018-09-22 DIAGNOSIS — J189 Pneumonia, unspecified organism: Secondary | ICD-10-CM | POA: Diagnosis not present

## 2018-09-22 DIAGNOSIS — G373 Acute transverse myelitis in demyelinating disease of central nervous system: Secondary | ICD-10-CM | POA: Diagnosis not present

## 2018-09-22 LAB — PROTIME-INR
INR: 2.2 — ABNORMAL HIGH (ref 0.8–1.2)
Prothrombin Time: 24.5 seconds — ABNORMAL HIGH (ref 11.4–15.2)

## 2018-09-22 LAB — VANCOMYCIN, TROUGH: Vancomycin Tr: 17 ug/mL (ref 15–20)

## 2018-09-22 NOTE — Progress Notes (Addendum)
Pulmonary Critical Care Medicine [place]   PULMONARY CRITICAL CARE SERVICE  PROGRESS NOTE  Date of Service: 09/22/2018  Benjamin Montoya  [id number]  DOB: 13-Sep-1970   DOA: 08/27/2018  Referring Physician: Merton Border, MD  HPI: Benjamin Montoya is a 48 y.o. male seen for follow up of Acute on Chronic Respiratory Failure.  Patient is on 30% trach collar using PMV without difficulty.  Medications: Reviewed on Rounds  Physical Exam:  Vitals: Pulse 93 respirations 18 BP 144/78 O2 sat 90% temp 100.5  Ventilator Settings ATC 30%  . General: Comfortable at this time . Eyes: Grossly normal lids, irises & conjunctiva . ENT: grossly tongue is normal . Neck: no obvious mass . Cardiovascular: S1 S2 normal no gallop . Respiratory: No rales or rhonchi noted . Abdomen: soft . Skin: no rash seen on limited exam . Musculoskeletal: not rigid . Psychiatric:unable to assess . Neurologic: no seizure no involuntary movements         Lab Data:   Basic Metabolic Panel: Recent Labs  Lab 09/20/18 0738  NA 135  K 4.9  CL 90*  CO2 33*  GLUCOSE 114*  BUN 11  CREATININE 0.38*  CALCIUM 9.7    ABG: No results for input(s): PHART, PCO2ART, PO2ART, HCO3, O2SAT in the last 168 hours.  Liver Function Tests: No results for input(s): AST, ALT, ALKPHOS, BILITOT, PROT, ALBUMIN in the last 168 hours. No results for input(s): LIPASE, AMYLASE in the last 168 hours. No results for input(s): AMMONIA in the last 168 hours.  CBC: Recent Labs  Lab 09/17/18 0735 09/20/18 0738  WBC 21.6* 11.5*  HGB 9.7* 9.4*  HCT 32.3* 31.6*  MCV 87.3 86.3  PLT 381 418*    Cardiac Enzymes: No results for input(s): CKTOTAL, CKMB, CKMBINDEX, TROPONINI in the last 168 hours.  BNP (last 3 results) No results for input(s): BNP in the last 8760 hours.  ProBNP (last 3 results) No results for input(s): PROBNP in the last 8760 hours.  Radiological Exams: No results  found.  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 Acute on chronic respiratory failure with hypoxia we will continue with the T collar wean and placed on PMV as tolerated 2. Acute transverse myelitis at baseline continue with supportive care 3. Healthcare associated pneumonia treated clinically improved 4. Pulmonary embolism treated we will continue to follow   I have personally seen and evaluated the patient, evaluated laboratory and imaging results, formulated the assessment and plan and placed orders. The Patient requires high complexity decision making for assessment and support.  Case was discussed on Rounds with the Respiratory Therapy Staff  Benjamin Gee, MD [facility] Pulmonary Critical Care Medicine Sleep Medicine

## 2018-09-23 ENCOUNTER — Other Ambulatory Visit (HOSPITAL_COMMUNITY): Payer: Self-pay

## 2018-09-23 DIAGNOSIS — I2699 Other pulmonary embolism without acute cor pulmonale: Secondary | ICD-10-CM | POA: Diagnosis not present

## 2018-09-23 DIAGNOSIS — J9621 Acute and chronic respiratory failure with hypoxia: Secondary | ICD-10-CM | POA: Diagnosis not present

## 2018-09-23 DIAGNOSIS — J189 Pneumonia, unspecified organism: Secondary | ICD-10-CM | POA: Diagnosis not present

## 2018-09-23 DIAGNOSIS — G373 Acute transverse myelitis in demyelinating disease of central nervous system: Secondary | ICD-10-CM | POA: Diagnosis not present

## 2018-09-23 LAB — PROTIME-INR
INR: 2.2 — ABNORMAL HIGH (ref 0.8–1.2)
Prothrombin Time: 24.1 s — ABNORMAL HIGH (ref 11.4–15.2)

## 2018-09-23 NOTE — Progress Notes (Addendum)
Pulmonary Critical Care Medicine [place]   PULMONARY CRITICAL CARE SERVICE  PROGRESS NOTE  Date of Service: 09/23/2018  [name]  [id number]  DOB: 1970/05/07   DOA: 08/27/2018  Referring Physician: Merton Border, MD  HPI: Benjamin Montoya is a 48 y.o. male seen for follow up of Acute on Chronic Respiratory Failure.  Patient continues on aerosol trach collar 30% FiO2 satting well with no distress.  Medications: Reviewed on Rounds  Physical Exam:  Vitals: Pulse 86 respirations 20 BP 141/78 O2 sat 95% temp 98.9  Ventilator Settings ATC 30%  . General: Comfortable at this time . Eyes: Grossly normal lids, irises & conjunctiva . ENT: grossly tongue is normal . Neck: no obvious mass . Cardiovascular: S1 S2 normal no gallop . Respiratory: No rales or rhonchi noted . Abdomen: soft . Skin: no rash seen on limited exam . Musculoskeletal: not rigid . Psychiatric:unable to assess . Neurologic: no seizure no involuntary movements         Lab Data:   Basic Metabolic Panel: Recent Labs  Lab 09/20/18 0738  NA 135  K 4.9  CL 90*  CO2 33*  GLUCOSE 114*  BUN 11  CREATININE 0.38*  CALCIUM 9.7    ABG: No results for input(s): PHART, PCO2ART, PO2ART, HCO3, O2SAT in the last 168 hours.  Liver Function Tests: No results for input(s): AST, ALT, ALKPHOS, BILITOT, PROT, ALBUMIN in the last 168 hours. No results for input(s): LIPASE, AMYLASE in the last 168 hours. No results for input(s): AMMONIA in the last 168 hours.  CBC: Recent Labs  Lab 09/17/18 0735 09/20/18 0738  WBC 21.6* 11.5*  HGB 9.7* 9.4*  HCT 32.3* 31.6*  MCV 87.3 86.3  PLT 381 418*    Cardiac Enzymes: No results for input(s): CKTOTAL, CKMB, CKMBINDEX, TROPONINI in the last 168 hours.  BNP (last 3 results) No results for input(s): BNP in the last 8760 hours.  ProBNP (last 3 results) No results for input(s): PROBNP in the last 8760 hours.  Radiological Exams: No results  found.  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 Acute on chronic respiratory failure with hypoxia we will continue with the T collar wean and placed on PMV as tolerated 2. Acute transverse myelitis at baseline continue with supportive care 3. Healthcare associated pneumonia treated clinically improved 4. Pulmonary embolism treated we will continue to follow   I have personally seen and evaluated the patient, evaluated laboratory and imaging results, formulated the assessment and plan and placed orders. The Patient requires high complexity decision making for assessment and support.  Case was discussed on Rounds with the Respiratory Therapy Staff  Allyne Gee, MD [facility] Pulmonary Critical Care Medicine Sleep Medicine

## 2018-09-24 DIAGNOSIS — J9621 Acute and chronic respiratory failure with hypoxia: Secondary | ICD-10-CM | POA: Diagnosis not present

## 2018-09-24 DIAGNOSIS — G373 Acute transverse myelitis in demyelinating disease of central nervous system: Secondary | ICD-10-CM | POA: Diagnosis not present

## 2018-09-24 DIAGNOSIS — I2699 Other pulmonary embolism without acute cor pulmonale: Secondary | ICD-10-CM | POA: Diagnosis not present

## 2018-09-24 DIAGNOSIS — J189 Pneumonia, unspecified organism: Secondary | ICD-10-CM | POA: Diagnosis not present

## 2018-09-24 LAB — PROTIME-INR
INR: 2.2 — ABNORMAL HIGH (ref 0.8–1.2)
Prothrombin Time: 24.4 seconds — ABNORMAL HIGH (ref 11.4–15.2)

## 2018-09-24 NOTE — Progress Notes (Signed)
Pulmonary Critical Care Medicine [place]   PULMONARY CRITICAL CARE SERVICE  PROGRESS NOTE  Date of Service: 09/24/2018  Benjamin Montoya  [id number]  DOB: 11-Dec-1970   DOA: 08/27/2018  Referring Physician: Merton Border, MD  HPI: Benjamin Montoya is a 48 y.o. male seen for follow up of Acute on Chronic Respiratory Failure.  Patient currently is on T collar on 30% FiO2 with the PMV in place which he has been tolerating  Medications: Reviewed on Rounds  Physical Exam:  Vitals: Temperature 98.0 pulse 100 respiratory rate 14 blood pressure 106/43 saturations 96%  Ventilator Settings off the ventilator on T collar right now  . General: Comfortable at this time . Eyes: Grossly normal lids, irises & conjunctiva . ENT: grossly tongue is normal . Neck: no obvious mass . Cardiovascular: S1 S2 normal no gallop . Respiratory: No rhonchi no rales are noted at this time . Abdomen: soft . Skin: no rash seen on limited exam . Musculoskeletal: not rigid . Psychiatric:unable to assess . Neurologic: no seizure no involuntary movements         Lab Data:   Basic Metabolic Panel: Recent Labs  Lab 09/20/18 0738  NA 135  K 4.9  CL 90*  CO2 33*  GLUCOSE 114*  BUN 11  CREATININE 0.38*  CALCIUM 9.7    ABG: No results for input(s): PHART, PCO2ART, PO2ART, HCO3, O2SAT in the last 168 hours.  Liver Function Tests: No results for input(s): AST, ALT, ALKPHOS, BILITOT, PROT, ALBUMIN in the last 168 hours. No results for input(s): LIPASE, AMYLASE in the last 168 hours. No results for input(s): AMMONIA in the last 168 hours.  CBC: Recent Labs  Lab 09/20/18 0738  WBC 11.5*  HGB 9.4*  HCT 31.6*  MCV 86.3  PLT 418*    Cardiac Enzymes: No results for input(s): CKTOTAL, CKMB, CKMBINDEX, TROPONINI in the last 168 hours.  BNP (last 3 results) No results for input(s): BNP in the last 8760 hours.  ProBNP (last 3 results) No results for input(s): PROBNP in the last  8760 hours.  Radiological Exams: No results found.  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 Acute on chronic respiratory failure with hypoxia we will continue with T collar trials continue secretion management pulmonary toilet. 2. Acute transverse myelitis demyelination continue with supportive care physical therapy as tolerated. 3. Healthcare associated pneumonia treated we will continue with supportive care 4. Pulmonary embolism treated we will continue to monitor   I have personally seen and evaluated the patient, evaluated laboratory and imaging results, formulated the assessment and plan and placed orders. The Patient requires high complexity decision making for assessment and support.  Case was discussed on Rounds with the Respiratory Therapy Staff  Allyne Gee, MD [facility] Pulmonary Critical Care Medicine Sleep Medicine

## 2018-09-25 DIAGNOSIS — I2699 Other pulmonary embolism without acute cor pulmonale: Secondary | ICD-10-CM | POA: Diagnosis not present

## 2018-09-25 DIAGNOSIS — J9621 Acute and chronic respiratory failure with hypoxia: Secondary | ICD-10-CM | POA: Diagnosis not present

## 2018-09-25 DIAGNOSIS — G373 Acute transverse myelitis in demyelinating disease of central nervous system: Secondary | ICD-10-CM | POA: Diagnosis not present

## 2018-09-25 DIAGNOSIS — J189 Pneumonia, unspecified organism: Secondary | ICD-10-CM | POA: Diagnosis not present

## 2018-09-25 LAB — PROTIME-INR
INR: 2.5 — ABNORMAL HIGH (ref 0.8–1.2)
Prothrombin Time: 26.3 seconds — ABNORMAL HIGH (ref 11.4–15.2)

## 2018-09-25 NOTE — Progress Notes (Addendum)
Pulmonary Critical Care Medicine [place]   PULMONARY CRITICAL CARE SERVICE  PROGRESS NOTE  Date of Service: 09/25/2018  Benjamin Montoya  [id number]  DOB: 07-May-1970   DOA: 08/27/2018  Referring Physician: Merton Border, MD  HPI: Benjamin Montoya is a 48 y.o. male seen for follow up of Acute on Chronic Respiratory Failure.  Patient continues on aerosol trach collar 30% FiO2 using PMV without O2.  Medications: Reviewed on Rounds  Physical Exam:  Vitals: Pulse 100 respirations 15 BP 133/76 O2 sat 92% temp 101.3  Ventilator Settings 30% AC  . General: Comfortable at this time . Eyes: Grossly normal lids, irises & conjunctiva . ENT: grossly tongue is normal . Neck: no obvious mass . Cardiovascular: S1 S2 normal no gallop . Respiratory: No rales or rhonchi noted . Abdomen: soft . Skin: no rash seen on limited exam . Musculoskeletal: not rigid . Psychiatric:unable to assess . Neurologic: no seizure no involuntary movements         Lab Data:   Basic Metabolic Panel: Recent Labs  Lab 09/20/18 0738  NA 135  K 4.9  CL 90*  CO2 33*  GLUCOSE 114*  BUN 11  CREATININE 0.38*  CALCIUM 9.7    ABG: No results for input(s): PHART, PCO2ART, PO2ART, HCO3, O2SAT in the last 168 hours.  Liver Function Tests: No results for input(s): AST, ALT, ALKPHOS, BILITOT, PROT, ALBUMIN in the last 168 hours. No results for input(s): LIPASE, AMYLASE in the last 168 hours. No results for input(s): AMMONIA in the last 168 hours.  CBC: Recent Labs  Lab 09/20/18 0738  WBC 11.5*  HGB 9.4*  HCT 31.6*  MCV 86.3  PLT 418*    Cardiac Enzymes: No results for input(s): CKTOTAL, CKMB, CKMBINDEX, TROPONINI in the last 168 hours.  BNP (last 3 results) No results for input(s): BNP in the last 8760 hours.  ProBNP (last 3 results) No results for input(s): PROBNP in the last 8760 hours.  Radiological Exams: No results found.  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 Acute on chronic respiratory failure with hypoxia we will continue with T collar trials continue secretion management pulmonary toilet. 2. Acute transverse myelitis demyelination continue with supportive care physical therapy as tolerated. 3. Healthcare associated pneumonia treated we will continue with supportive care 4. Pulmonary embolism treated we will continue to monitor   I have personally seen and evaluated the patient, evaluated laboratory and imaging results, formulated the assessment and plan and placed orders. The Patient requires high complexity decision making for assessment and support.  Case was discussed on Rounds with the Respiratory Therapy Staff  Allyne Gee, MD [facility] Pulmonary Critical Care Medicine Sleep Medicine

## 2018-09-26 DIAGNOSIS — J9621 Acute and chronic respiratory failure with hypoxia: Secondary | ICD-10-CM | POA: Diagnosis not present

## 2018-09-26 DIAGNOSIS — I2699 Other pulmonary embolism without acute cor pulmonale: Secondary | ICD-10-CM | POA: Diagnosis not present

## 2018-09-26 DIAGNOSIS — J189 Pneumonia, unspecified organism: Secondary | ICD-10-CM | POA: Diagnosis not present

## 2018-09-26 DIAGNOSIS — G373 Acute transverse myelitis in demyelinating disease of central nervous system: Secondary | ICD-10-CM | POA: Diagnosis not present

## 2018-09-26 LAB — BASIC METABOLIC PANEL
Anion gap: 10 (ref 5–15)
BUN: 8 mg/dL (ref 6–20)
CO2: 29 mmol/L (ref 22–32)
Calcium: 9.4 mg/dL (ref 8.9–10.3)
Chloride: 95 mmol/L — ABNORMAL LOW (ref 98–111)
Creatinine, Ser: 0.31 mg/dL — ABNORMAL LOW (ref 0.61–1.24)
GFR calc Af Amer: >60 mL/min (ref >60)
GFR calc non Af Amer: >60 mL/min (ref >60)
Glucose, Bld: 101 mg/dL — ABNORMAL HIGH (ref 70–99)
Potassium: 4.4 mmol/L (ref 3.5–5.1)
Sodium: 134 mmol/L — ABNORMAL LOW (ref 135–145)

## 2018-09-26 LAB — CBC
HCT: 31.6 % — ABNORMAL LOW (ref 39.0–52.0)
Hemoglobin: 9.6 g/dL — ABNORMAL LOW (ref 13.0–17.0)
MCH: 26.2 pg (ref 26.0–34.0)
MCHC: 30.4 g/dL (ref 30.0–36.0)
MCV: 86.1 fL (ref 80.0–100.0)
Platelets: 431 10*3/uL — ABNORMAL HIGH (ref 150–400)
RBC: 3.67 MIL/uL — ABNORMAL LOW (ref 4.22–5.81)
RDW: 16.2 % — ABNORMAL HIGH (ref 11.5–15.5)
WBC: 11.3 10*3/uL — ABNORMAL HIGH (ref 4.0–10.5)
nRBC: 0 % (ref 0.0–0.2)

## 2018-09-26 LAB — PROTIME-INR
INR: 2.7 — ABNORMAL HIGH (ref 0.8–1.2)
Prothrombin Time: 28.1 seconds — ABNORMAL HIGH (ref 11.4–15.2)

## 2018-09-26 NOTE — Progress Notes (Addendum)
Pulmonary Critical Care Medicine [place]   PULMONARY CRITICAL CARE SERVICE  PROGRESS NOTE  Date of Service: 09/26/2018  Benjamin Montoya  [id number]  DOB: [birth year]   DOA: 08/27/2018  Referring Physician: Merton Border, MD  HPI: Benjamin Montoya is a 48 y.o. male seen for follow up of Acute on Chronic Respiratory Failure.  Patient continues [place] percent aerosol trach collar using PMV with no difficulty at this time.  Medications: Reviewed on Rounds  Physical Exam:  Vitals: Pulse 89 respirations 13 BP 142/77 O2 sat 97% temp 100.4  Ventilator Settings 30% ATC  . General: Comfortable at this time . Eyes: Grossly normal lids, irises & conjunctiva . ENT: grossly tongue is normal . Neck: no obvious mass . Cardiovascular: S1 S2 normal no gallop . Respiratory: No rales or rhonchi noted . Abdomen: soft . Skin: no rash seen on limited exam . Musculoskeletal: not rigid . Psychiatric:unable to assess . Neurologic: no seizure no involuntary movements         Lab Data:   Basic Metabolic Panel: Recent Labs  Lab 09/20/18 0738 09/26/18 0517  NA 135 134*  K 4.9 4.4  CL 90* 95*  CO2 33* 29  GLUCOSE 114* 101*  BUN 11 8  CREATININE 0.38* 0.31*  CALCIUM 9.7 9.4    ABG: No results for input(s): PHART, PCO2ART, PO2ART, HCO3, O2SAT in the last 168 hours.  Liver Function Tests: No results for input(s): AST, ALT, ALKPHOS, BILITOT, PROT, ALBUMIN in the last 168 hours. No results for input(s): LIPASE, AMYLASE in the last 168 hours. No results for input(s): AMMONIA in the last 168 hours.  CBC: Recent Labs  Lab 09/20/18 0738 09/26/18 0517  WBC 11.5* 11.3*  HGB 9.4* 9.6*  HCT 31.6* 31.6*  MCV 86.3 86.1  PLT 418* 431*    Cardiac Enzymes: No results for input(s): CKTOTAL, CKMB, CKMBINDEX, TROPONINI in the last 168 hours.  BNP (last 3 results) No results for input(s): BNP in the last 8760 hours.  ProBNP (last 3 results) No results for input(s): PROBNP  in the last 8760 hours.  Radiological Exams: No results found.  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 Acute on chronic respiratory failure with hypoxia patient remains on aerosol trach collar 30% using PMV with no difficulty.  We will continue to wean as patient can tolerate.  Continue supportive measures at this time. 2. Acute transverse myelitis demyelination continue with supportive care physical therapy as tolerated. 3. Healthcare associated pneumonia treated we will continue with supportive care 4. Pulmonary embolism treated we will continue to monitor   I have personally seen and evaluated the patient, evaluated laboratory and imaging results, formulated the assessment and plan and placed orders. The Patient requires high complexity decision making for assessment and support.  Case was discussed on Rounds with the Respiratory Therapy Staff  Allyne Gee, MD [facility] Pulmonary Critical Care Medicine Sleep Medicine

## 2018-09-27 ENCOUNTER — Other Ambulatory Visit (HOSPITAL_COMMUNITY): Payer: Self-pay

## 2018-09-27 DIAGNOSIS — J9621 Acute and chronic respiratory failure with hypoxia: Secondary | ICD-10-CM | POA: Diagnosis not present

## 2018-09-27 DIAGNOSIS — G373 Acute transverse myelitis in demyelinating disease of central nervous system: Secondary | ICD-10-CM | POA: Diagnosis not present

## 2018-09-27 DIAGNOSIS — J189 Pneumonia, unspecified organism: Secondary | ICD-10-CM | POA: Diagnosis not present

## 2018-09-27 DIAGNOSIS — I2699 Other pulmonary embolism without acute cor pulmonale: Secondary | ICD-10-CM | POA: Diagnosis not present

## 2018-09-27 LAB — PROTIME-INR
INR: 2.5 — ABNORMAL HIGH (ref 0.8–1.2)
Prothrombin Time: 26.6 seconds — ABNORMAL HIGH (ref 11.4–15.2)

## 2018-09-27 NOTE — Progress Notes (Signed)
Pulmonary Critical Care Medicine [place]   PULMONARY CRITICAL CARE SERVICE  PROGRESS NOTE  Date of Service: 09/27/2018  Benjamin Montoya  [id number]  DOB: 1970/04/16   DOA: 08/27/2018  Referring Physician: Merton Border, MD  HPI: Benjamin Montoya is a 47 y.o. male seen for follow up of Acute on Chronic Respiratory Failure.  Patient is on 30% FiO2 he is not been able to come down to 28% FiO2  Medications: Reviewed on Rounds  Physical Exam:  Vitals: Temperature 99.7 pulse 94 respiratory rate is 14 blood pressure 137/75 saturations 96%  Ventilator Settings off the ventilator on T collar right now  . General: Comfortable at this time . Eyes: Grossly normal lids, irises & conjunctiva . ENT: grossly tongue is normal . Neck: no obvious mass . Cardiovascular: S1 S2 normal no gallop . Respiratory: No rhonchi no rales are noted at this time . Abdomen: soft . Skin: no rash seen on limited exam . Musculoskeletal: not rigid . Psychiatric:unable to assess . Neurologic: no seizure no involuntary movements         Lab Data:   Basic Metabolic Panel: Recent Labs  Lab 09/26/18 0517  NA 134*  K 4.4  CL 95*  CO2 29  GLUCOSE 101*  BUN 8  CREATININE 0.31*  CALCIUM 9.4    ABG: No results for input(s): PHART, PCO2ART, PO2ART, HCO3, O2SAT in the last 168 hours.  Liver Function Tests: No results for input(s): AST, ALT, ALKPHOS, BILITOT, PROT, ALBUMIN in the last 168 hours. No results for input(s): LIPASE, AMYLASE in the last 168 hours. No results for input(s): AMMONIA in the last 168 hours.  CBC: Recent Labs  Lab 09/26/18 0517  WBC 11.3*  HGB 9.6*  HCT 31.6*  MCV 86.1  PLT 431*    Cardiac Enzymes: No results for input(s): CKTOTAL, CKMB, CKMBINDEX, TROPONINI in the last 168 hours.  BNP (last 3 results) No results for input(s): BNP in the last 8760 hours.  ProBNP (last 3 results) No results for input(s): PROBNP in the last 8760 hours.  Radiological  Exams: No results found.  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 Acute on chronic respiratory failure with hypoxia we will continue with T collar have asked for respiratory therapy to decrease the FiO2 down to 21% via T collar in place him on nasal cannula 1 to 2 L to see if this actually will help with improving flow 2. 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 Healthcare associated pneumonia treated improved 4. Pulmonary embolism we will continue with present management   I have personally seen and evaluated the patient, evaluated laboratory and imaging results, formulated the assessment and plan and placed orders. The Patient requires high complexity decision making for assessment and support.  Case was discussed on Rounds with the Respiratory Therapy Staff  Allyne Gee, MD [facility] Pulmonary Critical Care Medicine Sleep Medicine

## 2018-09-28 DIAGNOSIS — J189 Pneumonia, unspecified organism: Secondary | ICD-10-CM | POA: Diagnosis not present

## 2018-09-28 DIAGNOSIS — G373 Acute transverse myelitis in demyelinating disease of central nervous system: Secondary | ICD-10-CM | POA: Diagnosis not present

## 2018-09-28 DIAGNOSIS — I2699 Other pulmonary embolism without acute cor pulmonale: Secondary | ICD-10-CM | POA: Diagnosis not present

## 2018-09-28 DIAGNOSIS — J9621 Acute and chronic respiratory failure with hypoxia: Secondary | ICD-10-CM | POA: Diagnosis not present

## 2018-09-28 LAB — CBC
HCT: 35.3 % — ABNORMAL LOW (ref 39.0–52.0)
Hemoglobin: 10.6 g/dL — ABNORMAL LOW (ref 13.0–17.0)
MCH: 26.2 pg (ref 26.0–34.0)
MCHC: 30 g/dL (ref 30.0–36.0)
MCV: 87.2 fL (ref 80.0–100.0)
Platelets: 476 10*3/uL — ABNORMAL HIGH (ref 150–400)
RBC: 4.05 MIL/uL — ABNORMAL LOW (ref 4.22–5.81)
RDW: 16.4 % — ABNORMAL HIGH (ref 11.5–15.5)
WBC: 14.3 10*3/uL — ABNORMAL HIGH (ref 4.0–10.5)
nRBC: 0 % (ref 0.0–0.2)

## 2018-09-28 LAB — PROTIME-INR
INR: 2.4 — ABNORMAL HIGH (ref 0.8–1.2)
Prothrombin Time: 25.5 seconds — ABNORMAL HIGH (ref 11.4–15.2)

## 2018-09-28 NOTE — Progress Notes (Addendum)
Pulmonary Critical Care Medicine [place]   PULMONARY CRITICAL CARE SERVICE  PROGRESS NOTE  Date of Service: 09/28/2018  Benjamin Montoya  ASN:[id number]  DOB: 20-Jun-1970   DOA: 08/27/2018  Referring Physician: Merton Border, MD  HPI: Benjamin Montoya is a 48 y.o. male seen for follow up of Acute on Chronic Respiratory Failure.  Patient continues on 21% aerosol trach collar with 2 L of oxygen via nasal cannula.  Using PMV without difficulty.  Medications: Reviewed on Rounds  Physical Exam:  Vitals: Pulse 99 respirations 14 BP 116/61 O2 sat 90% temp 96.8  Ventilator Settings 21% ATC  . General: Comfortable at this time . Eyes: Grossly normal lids, irises & conjunctiva . ENT: grossly tongue is normal . Neck: no obvious mass . Cardiovascular: S1 S2 normal no gallop . Respiratory: No rales or rhonchi noted . Abdomen: soft . Skin: no rash seen on limited exam . Musculoskeletal: not rigid . Psychiatric:unable to assess . Neurologic: no seizure no involuntary movements         Lab Data:   Basic Metabolic Panel: Recent Labs  Lab 09/26/18 0517  NA 134*  K 4.4  CL 95*  CO2 29  GLUCOSE 101*  BUN 8  CREATININE 0.31*  CALCIUM 9.4    ABG: No results for input(s): PHART, PCO2ART, PO2ART, HCO3, O2SAT in the last 168 hours.  Liver Function Tests: No results for input(s): AST, ALT, ALKPHOS, BILITOT, PROT, ALBUMIN in the last 168 hours. No results for input(s): LIPASE, AMYLASE in the last 168 hours. No results for input(s): AMMONIA in the last 168 hours.  CBC: Recent Labs  Lab 09/26/18 0517 09/28/18 0511  WBC 11.3* 14.3*  HGB 9.6* 10.6*  HCT 31.6* 35.3*  MCV 86.1 87.2  PLT 431* 476*    Cardiac Enzymes: No results for input(s): CKTOTAL, CKMB, CKMBINDEX, TROPONINI in the last 168 hours.  BNP (last 3 results) No results for input(s): BNP in the last 8760 hours.  ProBNP (last 3 results) No results for input(s): PROBNP in the last 8760  hours.  Radiological Exams: Dg Pelvis Portable  Result Date: 09/27/2018 CLINICAL DATA:  Gluteal soreness. Unable to move legs. EXAM: PORTABLE PELVIS 1-2 VIEWS COMPARISON:  None. FINDINGS: No definite displaced fracture is seen radiographically on this limited one view pelvic radiograph. However there is a radiodense nearly cylindrical structure overlying the left inferior pubic ramus with uncertain etiology. Tubular radiolucent structures are also seen overlying the rectum. IMPRESSION: Indeterminate radiodense structure overlying the left inferior pubic ramus. Please correlate to clinical exam. Electronically Signed   By: Fidela Salisbury M.D.   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 Acute on chronic respiratory failure with hypoxia continue with aerosol trach collar and 2 L of oxygen via nasal cannula.  Continue aggressive pulmonary toilet supportive measures. 2. 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 Healthcare associated pneumonia treated improved 4. Pulmonary embolism we will continue with present management   I have personally seen and evaluated the patient, evaluated laboratory and imaging results, formulated the assessment and plan and placed orders. The Patient requires high complexity decision making for assessment and support.  Case was discussed on Rounds with the Respiratory Therapy Staff  Kayla Deshaies A  Humphrey Rolls, MD [facility] Pulmonary Critical Care Medicine Sleep Medicine

## 2018-09-29 DIAGNOSIS — I2699 Other pulmonary embolism without acute cor pulmonale: Secondary | ICD-10-CM | POA: Diagnosis not present

## 2018-09-29 DIAGNOSIS — G373 Acute transverse myelitis in demyelinating disease of central nervous system: Secondary | ICD-10-CM | POA: Diagnosis not present

## 2018-09-29 DIAGNOSIS — J9621 Acute and chronic respiratory failure with hypoxia: Secondary | ICD-10-CM | POA: Diagnosis not present

## 2018-09-29 DIAGNOSIS — J189 Pneumonia, unspecified organism: Secondary | ICD-10-CM | POA: Diagnosis not present

## 2018-09-29 NOTE — Progress Notes (Addendum)
Pulmonary Critical Care Medicine [place]   PULMONARY CRITICAL CARE SERVICE  PROGRESS NOTE  Date of Service: 09/29/2018  Benjamin Montoya  JEH:[id number]  DOB: [birth year]   DOA: 08/27/2018  Referring Physician: Merton Border, MD  HPI: Benjamin Montoya is a 48 y.o. male seen for follow up of Acute on Chronic Respiratory Failure.  Patient remains on aerosol trach collar 21% FiO2 and 1 L of nasal cannula also.  Using PMV without difficulty.  Patient does have temperature overnight and had to be increased to 30% on aerosol trach collar.  Was able to count 21 this morning.  Medications: Reviewed on Rounds  Physical Exam:  Vitals: Pulse 69 respirations 14 BP 134/80 O2 sat 97% for  Ventilator Settings 20% ATC with 1 L nasal cannula  . General: Comfortable at this time . Eyes: Grossly normal lids, irises & conjunctiva . ENT: grossly tongue is normal . Neck: no obvious mass . Cardiovascular: S1 S2 normal no gallop . Respiratory: No rales or rhonchi noted . Abdomen: soft . Skin: no rash seen on limited exam . Musculoskeletal: not rigid . Psychiatric:unable to assess . Neurologic: no seizure no involuntary movements         Lab Data:   Basic Metabolic Panel: Recent Labs  Lab 09/26/18 0517  NA 134*  K 4.4  CL 95*  CO2 29  GLUCOSE 101*  BUN 8  CREATININE 0.31*  CALCIUM 9.4    ABG: No results for input(s): PHART, PCO2ART, PO2ART, HCO3, O2SAT in the last 168 hours.  Liver Function Tests: No results for input(s): AST, ALT, ALKPHOS, BILITOT, PROT, ALBUMIN in the last 168 hours. No results for input(s): LIPASE, AMYLASE in the last 168 hours. No results for input(s): AMMONIA in the last 168 hours.  CBC: Recent Labs  Lab 09/26/18 0517 09/28/18 0511  WBC 11.3* 14.3*  HGB 9.6* 10.6*  HCT 31.6* 35.3*  MCV 86.1 87.2  PLT 431* 476*    Cardiac Enzymes: No results for input(s): CKTOTAL, CKMB, CKMBINDEX, TROPONINI in the last 168 hours.  BNP (last 3  results) No results for input(s): BNP in the last 8760 hours.  ProBNP (last 3 results) No results for input(s): PROBNP in the last 8760 hours.  Radiological Exams: Dg Pelvis Portable  Result Date: 09/27/2018 CLINICAL DATA:  Gluteal soreness. Unable to move legs. EXAM: PORTABLE PELVIS 1-2 VIEWS COMPARISON:  None. FINDINGS: No definite displaced fracture is seen radiographically on this limited one view pelvic radiograph. However there is a radiodense nearly cylindrical structure overlying the left inferior pubic ramus with uncertain etiology. Tubular radiolucent structures are also seen overlying the rectum. IMPRESSION: Indeterminate radiodense structure overlying the left inferior pubic ramus. Please correlate to clinical exam. Electronically Signed   By: Fidela Salisbury M.D.   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 Acute on chronic respiratory failure with hypoxia continue with aerosol trach collar and 1 L of oxygen via nasal cannula.  Continue aggressive pulmonary toilet supportive measures. 2. 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 Healthcare associated pneumonia treated improved 4. Pulmonary embolism we will continue with present management   I have personally seen and evaluated the patient, evaluated laboratory and imaging results, formulated the assessment and  plan and placed orders. The Patient requires high complexity decision making for assessment and support.  Case was discussed on Rounds with the Respiratory Therapy Staff  Allyne Gee, MD [facility] Pulmonary Critical Care Medicine Sleep Medicine

## 2018-09-30 ENCOUNTER — Other Ambulatory Visit (HOSPITAL_COMMUNITY): Payer: Self-pay

## 2018-09-30 DIAGNOSIS — G373 Acute transverse myelitis in demyelinating disease of central nervous system: Secondary | ICD-10-CM | POA: Diagnosis not present

## 2018-09-30 DIAGNOSIS — J189 Pneumonia, unspecified organism: Secondary | ICD-10-CM | POA: Diagnosis not present

## 2018-09-30 DIAGNOSIS — J9621 Acute and chronic respiratory failure with hypoxia: Secondary | ICD-10-CM | POA: Diagnosis not present

## 2018-09-30 DIAGNOSIS — I2699 Other pulmonary embolism without acute cor pulmonale: Secondary | ICD-10-CM | POA: Diagnosis not present

## 2018-09-30 LAB — BLOOD GAS, ARTERIAL
Acid-Base Excess: 12.1 mmol/L — ABNORMAL HIGH (ref 0.0–2.0)
Acid-Base Excess: 12.3 mmol/L — ABNORMAL HIGH (ref 0.0–2.0)
Bicarbonate: 40.2 mmol/L — ABNORMAL HIGH (ref 20.0–28.0)
Bicarbonate: 36.5 mmol/L — ABNORMAL HIGH (ref 20.0–28.0)
Drawn by: 535471
FIO2: 40
FIO2: 0.6
MECHVT: 550 mL
O2 Saturation: 90.3 %
O2 Saturation: 92.4 %
PEEP: 5 cmH2O
Patient temperature: 98.6
Patient temperature: 98.6
RATE: 18 resp/min
pCO2 arterial: 107 mmHg (ref 32.0–48.0)
pCO2 arterial: 48.2 mmHg — ABNORMAL HIGH (ref 32.0–48.0)
pH, Arterial: 7.2 — ABNORMAL LOW (ref 7.350–7.450)
pH, Arterial: 7.491 — ABNORMAL HIGH (ref 7.350–7.450)
pO2, Arterial: 74.3 mmHg — ABNORMAL LOW (ref 83.0–108.0)
pO2, Arterial: 61 mmHg — ABNORMAL LOW (ref 83.0–108.0)

## 2018-09-30 LAB — CBC
HCT: 34.4 % — ABNORMAL LOW (ref 39.0–52.0)
Hemoglobin: 10 g/dL — ABNORMAL LOW (ref 13.0–17.0)
MCH: 25.7 pg — ABNORMAL LOW (ref 26.0–34.0)
MCHC: 29.1 g/dL — ABNORMAL LOW (ref 30.0–36.0)
MCV: 88.4 fL (ref 80.0–100.0)
Platelets: 471 10*3/uL — ABNORMAL HIGH (ref 150–400)
RBC: 3.89 MIL/uL — ABNORMAL LOW (ref 4.22–5.81)
RDW: 15.7 % — ABNORMAL HIGH (ref 11.5–15.5)
WBC: 16.7 10*3/uL — ABNORMAL HIGH (ref 4.0–10.5)
nRBC: 0 % (ref 0.0–0.2)

## 2018-09-30 LAB — BASIC METABOLIC PANEL
Anion gap: 9 (ref 5–15)
BUN: 10 mg/dL (ref 6–20)
CO2: 36 mmol/L — ABNORMAL HIGH (ref 22–32)
Calcium: 9.8 mg/dL (ref 8.9–10.3)
Chloride: 89 mmol/L — ABNORMAL LOW (ref 98–111)
Creatinine, Ser: 0.34 mg/dL — ABNORMAL LOW (ref 0.61–1.24)
GFR calc Af Amer: >60 mL/min (ref >60)
GFR calc non Af Amer: >60 mL/min (ref >60)
Glucose, Bld: 130 mg/dL — ABNORMAL HIGH (ref 70–99)
Potassium: 4.9 mmol/L (ref 3.5–5.1)
Sodium: 134 mmol/L — ABNORMAL LOW (ref 135–145)

## 2018-09-30 LAB — PROTIME-INR
INR: 3.7 — ABNORMAL HIGH (ref 0.8–1.2)
Prothrombin Time: 35.7 seconds — ABNORMAL HIGH (ref 11.4–15.2)

## 2018-09-30 NOTE — Progress Notes (Signed)
Pulmonary Critical Care Medicine [place]   PULMONARY CRITICAL CARE SERVICE  PROGRESS NOTE  Date of Service: 09/30/2018  Benjamin Montoya  QIW:[id number]  DOB: 04-29-1970   DOA: 08/27/2018  Referring Physician: Merton Border, MD  HPI: Benjamin Montoya is a 48 y.o. male seen for follow up of Acute on Chronic Respiratory Failure.  Patient currently is on T collar has been requiring 40% oxygen apparently overnight oxygen requirements went up this morning saturations were excellent also reportedly had a fever overnight  Medications: Reviewed on Rounds  Physical Exam:  Vitals: Temperature 97.1 pulse 120 respiratory 26 blood pressure 136/85 saturations 95%  Ventilator Settings off the ventilator right now on T collar FiO2 is 40%  . General: Comfortable at this time . Eyes: Grossly normal lids, irises & conjunctiva . ENT: grossly tongue is normal . Neck: no obvious mass . Cardiovascular: S1 S2 normal no gallop . Respiratory: No rhonchi no rales are noted at this time . Abdomen: soft . Skin: no rash seen on limited exam . Musculoskeletal: not rigid . Psychiatric:unable to assess . Neurologic: no seizure no involuntary movements         Lab Data:   Basic Metabolic Panel: Recent Labs  Lab 09/26/18 0517 09/30/18 0703  NA 134* 134*  K 4.4 4.9  CL 95* 89*  CO2 29 36*  GLUCOSE 101* 130*  BUN 8 10  CREATININE 0.31* 0.34*  CALCIUM 9.4 9.8    ABG: Recent Labs  Lab 09/30/18 1303 09/30/18 1830  PHART 7.200* 7.491*  PCO2ART 107* 48.2*  PO2ART 74.3* 61.0*  HCO3 40.2* 36.5*  O2SAT 90.3 92.4    Liver Function Tests: No results for input(s): AST, ALT, ALKPHOS, BILITOT, PROT, ALBUMIN in the last 168 hours. No results for input(s): LIPASE, AMYLASE in the last 168 hours. No results for input(s): AMMONIA in the last 168 hours.  CBC: Recent Labs  Lab 09/26/18 0517 09/28/18 0511 09/30/18 0703  WBC 11.3* 14.3* 16.7*  HGB 9.6* 10.6* 10.0*  HCT 31.6* 35.3*  34.4*  MCV 86.1 87.2 88.4  PLT 431* 476* 471*    Cardiac Enzymes: No results for input(s): CKTOTAL, CKMB, CKMBINDEX, TROPONINI in the last 168 hours.  BNP (last 3 results) No results for input(s): BNP in the last 8760 hours.  ProBNP (last 3 results) No results for input(s): PROBNP in the last 8760 hours.  Radiological Exams: Dg Chest Port 1 View  Result Date: 09/30/2018 CLINICAL DATA:  Acute on chronic respiratory failure. EXAM: PORTABLE CHEST 1 VIEW COMPARISON:  Radiograph of September 17, 2018. FINDINGS: Tracheostomy is in good position. Stable cardiomediastinal silhouette. No pneumothorax is noted. Bibasilar opacities are noted concerning for atelectasis or infiltrates. Bony thorax is unremarkable. IMPRESSION: Mild bibasilar opacities are noted concerning for atelectasis or pneumonia. Electronically Signed   By: Marijo Conception M.D.   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 Acute on chronic respiratory failure with hypoxia we will continue with T collar trials currently on 40% FiO2 with good saturations will try to wean the FiO2 down as tolerated. 2. Acute transverse myelitis continue with supportive care prognosis guarded 3. Healthcare associated pneumonia treated we will continue with present management 4. Pulmonary embolism treated continue with supportive care   I have personally seen and evaluated  the patient, evaluated laboratory and imaging results, formulated the assessment and plan and placed orders. The Patient requires high complexity decision making for assessment and support.  Case was discussed on Rounds with the Respiratory Therapy Staff  Allyne Gee, MD [facility] Pulmonary Critical Care Medicine Sleep Medicine

## 2018-10-01 DIAGNOSIS — J189 Pneumonia, unspecified organism: Secondary | ICD-10-CM | POA: Diagnosis not present

## 2018-10-01 DIAGNOSIS — I2699 Other pulmonary embolism without acute cor pulmonale: Secondary | ICD-10-CM | POA: Diagnosis not present

## 2018-10-01 DIAGNOSIS — G373 Acute transverse myelitis in demyelinating disease of central nervous system: Secondary | ICD-10-CM | POA: Diagnosis not present

## 2018-10-01 DIAGNOSIS — J9621 Acute and chronic respiratory failure with hypoxia: Secondary | ICD-10-CM | POA: Diagnosis not present

## 2018-10-01 LAB — CULTURE, BLOOD (ROUTINE X 2)
Culture: NO GROWTH
Culture: NO GROWTH
Special Requests: ADEQUATE
Special Requests: ADEQUATE

## 2018-10-01 LAB — PROTIME-INR
INR: 3.7 — ABNORMAL HIGH (ref 0.8–1.2)
Prothrombin Time: 35.8 seconds — ABNORMAL HIGH (ref 11.4–15.2)

## 2018-10-01 LAB — VANCOMYCIN, TROUGH: Vancomycin Tr: 17 ug/mL (ref 15–20)

## 2018-10-01 NOTE — Progress Notes (Addendum)
Pulmonary Critical Care Medicine [place]   PULMONARY CRITICAL CARE SERVICE  PROGRESS NOTE  Date of Service: 10/01/2018  Benjamin Montoya  VZD:[id number]  DOB: [birth year]   DOA: 08/27/2018  Referring Physician: Merton Border, MD  HPI: Benjamin Montoya is a 48 y.o. male seen for follow up of Acute on Chronic Respiratory Failure.  Patient had an episode overnight where he became unresponsive acidotic and hypoxic.  Was placed back on full support on the ventilator requiring 45% FiO2 today doing much better and has weaned to pressure support 12/5 with an FiO2 of 45% for goal of 4 to 6 hours today.  Medications: Reviewed on Rounds  Physical Exam:  Vitals: Pulse 90 respirations 18 BP 148/76 O2 sat 99% temp 97.4  Ventilator Settings pressure support 12/5 FiO2 45%  . General: Comfortable at this time . Eyes: Grossly normal lids, irises & conjunctiva . ENT: grossly tongue is normal . Neck: no obvious mass . Cardiovascular: S1 S2 normal no gallop . Respiratory: No rales or rhonchi noted . Abdomen: soft . Skin: no rash seen on limited exam . Musculoskeletal: not rigid . Psychiatric:unable to assess . Neurologic: no seizure no involuntary movements         Lab Data:   Basic Metabolic Panel: Recent Labs  Lab 09/26/18 0517 09/30/18 0703  NA 134* 134*  K 4.4 4.9  CL 95* 89*  CO2 29 36*  GLUCOSE 101* 130*  BUN 8 10  CREATININE 0.31* 0.34*  CALCIUM 9.4 9.8    ABG: Recent Labs  Lab 09/30/18 1303 09/30/18 1830  PHART 7.200* 7.491*  PCO2ART 107* 48.2*  PO2ART 74.3* 61.0*  HCO3 40.2* 36.5*  O2SAT 90.3 92.4    Liver Function Tests: No results for input(s): AST, ALT, ALKPHOS, BILITOT, PROT, ALBUMIN in the last 168 hours. No results for input(s): LIPASE, AMYLASE in the last 168 hours. No results for input(s): AMMONIA in the last 168 hours.  CBC: Recent Labs  Lab 09/26/18 0517 09/28/18 0511 09/30/18 0703  WBC 11.3* 14.3* 16.7*  HGB 9.6* 10.6* 10.0*   HCT 31.6* 35.3* 34.4*  MCV 86.1 87.2 88.4  PLT 431* 476* 471*    Cardiac Enzymes: No results for input(s): CKTOTAL, CKMB, CKMBINDEX, TROPONINI in the last 168 hours.  BNP (last 3 results) No results for input(s): BNP in the last 8760 hours.  ProBNP (last 3 results) No results for input(s): PROBNP in the last 8760 hours.  Radiological Exams: Dg Chest Port 1 View  Result Date: 09/30/2018 CLINICAL DATA:  Acute on chronic respiratory failure. EXAM: PORTABLE CHEST 1 VIEW COMPARISON:  Radiograph of September 17, 2018. FINDINGS: Tracheostomy is in good position. Stable cardiomediastinal silhouette. No pneumothorax is noted. Bibasilar opacities are noted concerning for atelectasis or infiltrates. Bony thorax is unremarkable. IMPRESSION: Mild bibasilar opacities are noted concerning for atelectasis or pneumonia. Electronically Signed   By: Marijo Conception M.D.   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 Acute on chronic respiratory failure with hypoxia due to patient's unresponsiveness overnight and critical gas.  Patient was placed back on full support on the ventilator.  Has been weaned to pressure support 12/5 with FiO2 of 45% for goal of 4 to 6 hours today.  Continue aggressive pulmonary toilet and supportive measures. 2. Acute  transverse myelitis continue with supportive care prognosis guarded 3. Healthcare associated pneumonia treated we will continue with present management 4. Pulmonary embolism treated continue with supportive care   I have personally seen and evaluated the patient, evaluated laboratory and imaging results, formulated the assessment and plan and placed orders. The Patient requires high complexity decision making for assessment and support.  Case was  discussed on Rounds with the Respiratory Therapy Staff  Yevonne PaxSaadat A Carlisle Torgeson, MD [facility] Pulmonary Critical Care Medicine Sleep Medicine

## 2018-10-02 DIAGNOSIS — J189 Pneumonia, unspecified organism: Secondary | ICD-10-CM | POA: Diagnosis not present

## 2018-10-02 DIAGNOSIS — I2699 Other pulmonary embolism without acute cor pulmonale: Secondary | ICD-10-CM | POA: Diagnosis not present

## 2018-10-02 DIAGNOSIS — J9621 Acute and chronic respiratory failure with hypoxia: Secondary | ICD-10-CM | POA: Diagnosis not present

## 2018-10-02 DIAGNOSIS — G373 Acute transverse myelitis in demyelinating disease of central nervous system: Secondary | ICD-10-CM | POA: Diagnosis not present

## 2018-10-02 LAB — PROTIME-INR
INR: 3 — ABNORMAL HIGH (ref 0.8–1.2)
Prothrombin Time: 30.5 s — ABNORMAL HIGH (ref 11.4–15.2)

## 2018-10-02 NOTE — Progress Notes (Addendum)
Pulmonary Critical Care Medicine [place]   PULMONARY CRITICAL CARE SERVICE  PROGRESS NOTE  Date of Service: 10/02/2018  Benjamin Montoya  CHY:[id number]  DOB: 05-Apr-1970   DOA: 08/27/2018  Referring Physician: Merton Border, MD  HPI: Benjamin Montoya is a 48 y.o. male seen for follow up of Acute on Chronic Respiratory Failure.  Patient continues on her support 12/5 FiO2 35% for 12-hour goal today.  Satting well with no distress.  Medications: Reviewed on Rounds  Physical Exam:  Vitals: Pulse 84 respirations 18 BP 114/67 O2 sat 95% temp 100.4  Ventilator Settings pressure support 12/5 FiO2 of 35%  . General: Comfortable at this time . Eyes: Grossly normal lids, irises & conjunctiva . ENT: grossly tongue is normal . Neck: no obvious mass . Cardiovascular: S1 S2 normal no gallop . Respiratory: No rales or rhonchi noted . Abdomen: soft . Skin: no rash seen on limited exam . Musculoskeletal: not rigid . Psychiatric:unable to assess . Neurologic: no seizure no involuntary movements         Lab Data:   Basic Metabolic Panel: Recent Labs  Lab 09/26/18 0517 09/30/18 0703  NA 134* 134*  K 4.4 4.9  CL 95* 89*  CO2 29 36*  GLUCOSE 101* 130*  BUN 8 10  CREATININE 0.31* 0.34*  CALCIUM 9.4 9.8    ABG: Recent Labs  Lab 09/30/18 1303 09/30/18 1830  PHART 7.200* 7.491*  PCO2ART 107* 48.2*  PO2ART 74.3* 61.0*  HCO3 40.2* 36.5*  O2SAT 90.3 92.4    Liver Function Tests: No results for input(s): AST, ALT, ALKPHOS, BILITOT, PROT, ALBUMIN in the last 168 hours. No results for input(s): LIPASE, AMYLASE in the last 168 hours. No results for input(s): AMMONIA in the last 168 hours.  CBC: Recent Labs  Lab 09/26/18 0517 09/28/18 0511 09/30/18 0703  WBC 11.3* 14.3* 16.7*  HGB 9.6* 10.6* 10.0*  HCT 31.6* 35.3* 34.4*  MCV 86.1 87.2 88.4  PLT 431* 476* 471*    Cardiac Enzymes: No results for input(s): CKTOTAL, CKMB, CKMBINDEX, TROPONINI in the last 168  hours.  BNP (last 3 results) No results for input(s): BNP in the last 8760 hours.  ProBNP (last 3 results) No results for input(s): PROBNP in the last 8760 hours.  Radiological Exams: No results found.  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 Acute on chronic respiratory failure with hypoxia  patient is doing well today on pressure support has a goal of 12 hours at this time.  Continue supportive measures and pulmonary toilet.  Currently on 35% FiO2. 2. Acute transverse myelitis continue with supportive care prognosis guarded 3. Healthcare associated pneumonia treated we will continue with present management 4. Pulmonary embolism treated continue with supportive care   I have personally seen and evaluated the patient, evaluated laboratory and imaging results, formulated the assessment and plan and placed orders. The Patient requires high complexity decision making for assessment and support.  Case was discussed on Rounds with the Respiratory Therapy Staff  Allyne Gee, MD [facility] Pulmonary Critical Care Medicine Sleep Medicine

## 2018-10-03 DIAGNOSIS — J9621 Acute and chronic respiratory failure with hypoxia: Secondary | ICD-10-CM | POA: Diagnosis not present

## 2018-10-03 DIAGNOSIS — I2699 Other pulmonary embolism without acute cor pulmonale: Secondary | ICD-10-CM | POA: Diagnosis not present

## 2018-10-03 DIAGNOSIS — G373 Acute transverse myelitis in demyelinating disease of central nervous system: Secondary | ICD-10-CM | POA: Diagnosis not present

## 2018-10-03 DIAGNOSIS — J189 Pneumonia, unspecified organism: Secondary | ICD-10-CM | POA: Diagnosis not present

## 2018-10-03 LAB — PROTIME-INR
INR: 2.6 — ABNORMAL HIGH (ref 0.8–1.2)
Prothrombin Time: 27.1 seconds — ABNORMAL HIGH (ref 11.4–15.2)

## 2018-10-03 NOTE — Progress Notes (Addendum)
Pulmonary Critical Care Medicine [place]   PULMONARY CRITICAL CARE SERVICE  PROGRESS NOTE  Date of Service: 10/03/2018  Benjamin Montoya  OZD:[id number]  DOB: 1970/08/25   DOA: 08/27/2018  Referring Physician: Merton Border, MD  HPI: Benjamin Montoya is a 48 y.o. male seen for follow up of Acute on Chronic Respiratory Failure.  Patient remains on pressure support 12/5 with an FiO2 of 35% for 16-hour goal currently satting well no distress.  Medications: Reviewed on Rounds  Physical Exam:  Vitals: Pulse 79 respirations 18 BP 124/69 O2 sat 95% to 98.3  Ventilator Settings pressure support 12/5 FiO2 35%  . General: Comfortable at this time . Eyes: Grossly normal lids, irises & conjunctiva . ENT: grossly tongue is normal . Neck: no obvious mass . Cardiovascular: S1 S2 normal no gallop . Respiratory: No rales or rhonchi noted . Abdomen: soft . Skin: no rash seen on limited exam . Musculoskeletal: not rigid . Psychiatric:unable to assess . Neurologic: no seizure no involuntary movements         Lab Data:   Basic Metabolic Panel: Recent Labs  Lab 09/30/18 0703  NA 134*  K 4.9  CL 89*  CO2 36*  GLUCOSE 130*  BUN 10  CREATININE 0.34*  CALCIUM 9.8    ABG: Recent Labs  Lab 09/30/18 1303 09/30/18 1830  PHART 7.200* 7.491*  PCO2ART 107* 48.2*  PO2ART 74.3* 61.0*  HCO3 40.2* 36.5*  O2SAT 90.3 92.4    Liver Function Tests: No results for input(s): AST, ALT, ALKPHOS, BILITOT, PROT, ALBUMIN in the last 168 hours. No results for input(s): LIPASE, AMYLASE in the last 168 hours. No results for input(s): AMMONIA in the last 168 hours.  CBC: Recent Labs  Lab 09/28/18 0511 09/30/18 0703  WBC 14.3* 16.7*  HGB 10.6* 10.0*  HCT 35.3* 34.4*  MCV 87.2 88.4  PLT 476* 471*    Cardiac Enzymes: No results for input(s): CKTOTAL, CKMB, CKMBINDEX, TROPONINI in the last 168 hours.  BNP (last 3 results) No results for input(s): BNP in the last 8760  hours.  ProBNP (last 3 results) No results for input(s): PROBNP in the last 8760 hours.  Radiological Exams: No results found.  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 Acute on chronic respiratory failure with hypoxia patient is doing well today on pressure support has a goal of 16 hours at this time.  Continue supportive measures and pulmonary toilet.  Currently on 35% FiO2. 2. Acute transverse myelitis continue with supportive care prognosis guarded 3. Healthcare associated pneumonia treated we will continue with present management 4. Pulmonary embolism treated continue with supportive care   I have personally seen and evaluated the patient, evaluated laboratory and imaging results, formulated the assessment and plan and placed orders. The Patient requires high complexity decision making for assessment and support.  Case was discussed on Rounds with the Respiratory Therapy Staff  Allyne Gee, MD [facility] Pulmonary Critical Care Medicine Sleep Medicine

## 2018-10-04 DIAGNOSIS — J189 Pneumonia, unspecified organism: Secondary | ICD-10-CM | POA: Diagnosis not present

## 2018-10-04 DIAGNOSIS — J9621 Acute and chronic respiratory failure with hypoxia: Secondary | ICD-10-CM | POA: Diagnosis not present

## 2018-10-04 DIAGNOSIS — G373 Acute transverse myelitis in demyelinating disease of central nervous system: Secondary | ICD-10-CM | POA: Diagnosis not present

## 2018-10-04 DIAGNOSIS — I2699 Other pulmonary embolism without acute cor pulmonale: Secondary | ICD-10-CM | POA: Diagnosis not present

## 2018-10-04 LAB — PROTIME-INR
INR: 2.4 — ABNORMAL HIGH (ref 0.8–1.2)
Prothrombin Time: 26.1 seconds — ABNORMAL HIGH (ref 11.4–15.2)

## 2018-10-04 NOTE — Progress Notes (Signed)
Pulmonary Critical Care Medicine [place]   PULMONARY CRITICAL CARE SERVICE  PROGRESS NOTE  Date of Service: 10/04/2018  Benjamin Montoya  UJW:[id number]  DOB: Aug 08, 1970   DOA: 08/27/2018  Referring Physician: Merton Border, MD  HPI: Benjamin Montoya is a 48 y.o. male seen for follow up of Acute on Chronic Respiratory Failure.  Patient right now is on T collar has been on 40% FiO2 the goal is for 4 hours  Medications: Reviewed on Rounds  Physical Exam:  Vitals: Temperature 98.2 pulse 67 respiratory 18 blood pressure 131/61 saturations 94%  Ventilator Settings on T collar right now 40% FiO2  . General: Comfortable at this time . Eyes: Grossly normal lids, irises & conjunctiva . ENT: grossly tongue is normal . Neck: no obvious mass . Cardiovascular: S1 S2 normal no gallop . Respiratory: No rhonchi no rales are noted . Abdomen: soft . Skin: no rash seen on limited exam . Musculoskeletal: not rigid . Psychiatric:unable to assess . Neurologic: no seizure no involuntary movements         Lab Data:   Basic Metabolic Panel: Recent Labs  Lab 09/30/18 0703  NA 134*  K 4.9  CL 89*  CO2 36*  GLUCOSE 130*  BUN 10  CREATININE 0.34*  CALCIUM 9.8    ABG: Recent Labs  Lab 09/30/18 1303 09/30/18 1830  PHART 7.200* 7.491*  PCO2ART 107* 48.2*  PO2ART 74.3* 61.0*  HCO3 40.2* 36.5*  O2SAT 90.3 92.4    Liver Function Tests: No results for input(s): AST, ALT, ALKPHOS, BILITOT, PROT, ALBUMIN in the last 168 hours. No results for input(s): LIPASE, AMYLASE in the last 168 hours. No results for input(s): AMMONIA in the last 168 hours.  CBC: Recent Labs  Lab 09/28/18 0511 09/30/18 0703  WBC 14.3* 16.7*  HGB 10.6* 10.0*  HCT 35.3* 34.4*  MCV 87.2 88.4  PLT 476* 471*    Cardiac Enzymes: No results for input(s): CKTOTAL, CKMB, CKMBINDEX, TROPONINI in the last 168 hours.  BNP (last 3 results) No results for input(s): BNP in the last 8760  hours.  ProBNP (last 3 results) No results for input(s): PROBNP in the last 8760 hours.  Radiological Exams: No results found.  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 Acute on chronic respiratory failure with hypoxia we will continue with T collar trials the goal was for 4 hours try to increase the time of the vent 2. Acute transverse myelitis patient is at baseline continue with present management 3. Healthcare associated pneumonia treated clinically improved 4. Pulmonary embolism at baseline continue with supportive care   I have personally seen and evaluated the patient, evaluated laboratory and imaging results, formulated the assessment and plan and placed orders. The Patient requires high complexity decision making for assessment and support.  Case was discussed on Rounds with the Respiratory Therapy Staff  Allyne Gee, MD [facility] Pulmonary Critical Care Medicine Sleep Medicine

## 2018-10-05 ENCOUNTER — Other Ambulatory Visit (HOSPITAL_COMMUNITY): Payer: Self-pay

## 2018-10-05 DIAGNOSIS — J189 Pneumonia, unspecified organism: Secondary | ICD-10-CM | POA: Diagnosis not present

## 2018-10-05 DIAGNOSIS — J9621 Acute and chronic respiratory failure with hypoxia: Secondary | ICD-10-CM | POA: Diagnosis not present

## 2018-10-05 DIAGNOSIS — R0902 Hypoxemia: Secondary | ICD-10-CM

## 2018-10-05 DIAGNOSIS — G373 Acute transverse myelitis in demyelinating disease of central nervous system: Secondary | ICD-10-CM | POA: Diagnosis not present

## 2018-10-05 LAB — PROTIME-INR
INR: 2.7 — ABNORMAL HIGH (ref 0.8–1.2)
Prothrombin Time: 28.4 seconds — ABNORMAL HIGH (ref 11.4–15.2)

## 2018-10-05 LAB — VANCOMYCIN, TROUGH: Vancomycin Tr: 11 ug/mL — ABNORMAL LOW (ref 15–20)

## 2018-10-05 NOTE — Progress Notes (Signed)
[facility]   PULMONARY CRITICAL CARE SERVICE  PROGRESS NOTE  Date of Service: 10/05/2018  Benjamin MilletDavid Pollet  ZOX:[id number]  DOB: 1970/11/29   DOA: 08/27/2018  Referring Physician: Carron CurieAli Hijazi, MD  HPI: Benjamin Montoya is a 48 y.o. male seen for follow up of Acute on Chronic Respiratory Failure.  Patient was noted to have increased work of breathing was placed back on the ventilator assist control mode and was on 50% FiO2.  Last chest x-ray had shown some increased congestion.  Medications: Reviewed on Rounds  Physical Exam:  Vitals: Temperature 99.0 pulse 83 respiratory 18 blood pressure 95/52 saturations 98%  Ventilator Settings patient was on assist control FiO2 50% tidal volume 500 PEEP 7  . General: Comfortable at this time . Eyes: Grossly normal lids, irises & conjunctiva . ENT: grossly tongue is normal . Neck: no obvious mass . Cardiovascular: S1 S2 normal no gallop . Respiratory: No rhonchi no rales are noted at this time . Abdomen: soft . Skin: no rash seen on limited exam . Musculoskeletal: not rigid . Psychiatric:unable to assess . Neurologic: no seizure no involuntary movements         Lab Data:   Basic Metabolic Panel: Recent Labs  Lab 09/30/18 0703  NA 134*  K 4.9  CL 89*  CO2 36*  GLUCOSE 130*  BUN 10  CREATININE 0.34*  CALCIUM 9.8    ABG: Recent Labs  Lab 09/30/18 1303 09/30/18 1830  PHART 7.200* 7.491*  PCO2ART 107* 48.2*  PO2ART 74.3* 61.0*  HCO3 40.2* 36.5*  O2SAT 90.3 92.4    Liver Function Tests: No results for input(s): AST, ALT, ALKPHOS, BILITOT, PROT, ALBUMIN in the last 168 hours. No results for input(s): LIPASE, AMYLASE in the last 168 hours. No results for input(s): AMMONIA in the last 168 hours.  CBC: Recent Labs  Lab 09/30/18 0703  WBC 16.7*  HGB 10.0*  HCT 34.4*  MCV 88.4  PLT 471*    Cardiac Enzymes: No results for input(s): CKTOTAL, CKMB, CKMBINDEX, TROPONINI  in the last 168 hours.  BNP (last 3 results) No results for input(s): BNP in the last 8760 hours.  ProBNP (last 3 results) No results for input(s): PROBNP in the last 8760 hours.  Radiological Exams: Dg Chest Port 1 View  Result Date: 10/05/2018 CLINICAL DATA:  Hypoxia.  Tracheostomy. EXAM: PORTABLE CHEST 1 VIEW COMPARISON:  September 30, 2018 FINDINGS: The tracheostomy tube is in stable position. No pneumothorax. Bilateral layering pleural effusions with underlying opacities, more pronounced in the interval. Cardiomediastinal silhouette is stable. The lungs are otherwise clear. IMPRESSION: 1. Small bilateral layering effusions with underlying opacities, more pronounced in the interval. 2. The more focal opacity in the right base was concerning for pneumonia on the previous study. This region is obscured on today's study. I suspect at least some atelectasis underlying the effusions. 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 Acute on chronic respiratory failure with hypoxia we will continue with full vent support for now patient has increased FiO2 requirements ordered a follow-up chest x-ray today which showed bilateral pleural effusions and some more pronounced underlying opacities.  Will discuss with the primary care team 2. Healthcare associated  pneumonia the latest x-ray showing increased densities with possible resurgence of pneumonia will discuss with the primary care team 3. Acute transverse myelitis physical therapy as tolerated we will continue present management 4. Pulmonary embolism treated   I have personally seen and evaluated the patient, evaluated laboratory and imaging results, formulated the assessment and  plan and placed orders.  Acute change in condition time 35 minutes The Patient requires high complexity decision making for assessment and support.  Case was discussed on Rounds with the Respiratory Therapy Staff  Allyne Gee, MD [facility] Pulmonary Critical Care Medicine Sleep Medicine

## 2018-10-06 DIAGNOSIS — G373 Acute transverse myelitis in demyelinating disease of central nervous system: Secondary | ICD-10-CM | POA: Diagnosis not present

## 2018-10-06 DIAGNOSIS — J9621 Acute and chronic respiratory failure with hypoxia: Secondary | ICD-10-CM | POA: Diagnosis not present

## 2018-10-06 DIAGNOSIS — J189 Pneumonia, unspecified organism: Secondary | ICD-10-CM | POA: Diagnosis not present

## 2018-10-06 DIAGNOSIS — I2699 Other pulmonary embolism without acute cor pulmonale: Secondary | ICD-10-CM | POA: Diagnosis not present

## 2018-10-06 LAB — BASIC METABOLIC PANEL
Anion gap: 16 — ABNORMAL HIGH (ref 5–15)
BUN: 12 mg/dL (ref 6–20)
CO2: 27 mmol/L (ref 22–32)
Calcium: 8.3 mg/dL — ABNORMAL LOW (ref 8.9–10.3)
Chloride: 103 mmol/L (ref 98–111)
Creatinine, Ser: 0.55 mg/dL — ABNORMAL LOW (ref 0.61–1.24)
GFR calc Af Amer: >60 mL/min (ref >60)
GFR calc non Af Amer: >60 mL/min (ref >60)
Glucose, Bld: 88 mg/dL (ref 70–99)
Potassium: 3.6 mmol/L (ref 3.5–5.1)
Sodium: 146 mmol/L — ABNORMAL HIGH (ref 135–145)

## 2018-10-06 LAB — CBC
HCT: 29.9 % — ABNORMAL LOW (ref 39.0–52.0)
Hemoglobin: 8.8 g/dL — ABNORMAL LOW (ref 13.0–17.0)
MCH: 25.5 pg — ABNORMAL LOW (ref 26.0–34.0)
MCHC: 29.4 g/dL — ABNORMAL LOW (ref 30.0–36.0)
MCV: 86.7 fL (ref 80.0–100.0)
Platelets: 414 10*3/uL — ABNORMAL HIGH (ref 150–400)
RBC: 3.45 MIL/uL — ABNORMAL LOW (ref 4.22–5.81)
RDW: 16.1 % — ABNORMAL HIGH (ref 11.5–15.5)
WBC: 8.9 10*3/uL (ref 4.0–10.5)
nRBC: 0 % (ref 0.0–0.2)

## 2018-10-06 LAB — PROTIME-INR
INR: 3.2 — ABNORMAL HIGH (ref 0.8–1.2)
Prothrombin Time: 32.5 seconds — ABNORMAL HIGH (ref 11.4–15.2)

## 2018-10-06 NOTE — Progress Notes (Addendum)
Pulmonary Critical Care [facility]   PULMONARY CRITICAL CARE SERVICE  PROGRESS NOTE  Date of Service: 10/06/2018  Benjamin Montoya  ZOX:[id number]  DOB: 11/17/1970   DOA: 08/27/2018  Referring Physician: Carron CurieAli Hijazi, MD  HPI: Benjamin Montoya is a 48 y.o. male seen for follow up of Acute on Chronic Respiratory Failure.  Patient was weaned to aerosol trach collar today but only lasted 2 minutes before his O2 saturation was down to 83%.  Now resting on pressure support 12/6 with FiO2 30% for goal of 20 hours.  Medications: Reviewed on Rounds  Physical Exam:  Vitals: Pulse 85 respirations 19 BP one 9/59 O2 sat 97% temp 98.9  Ventilator Settings pressure support 12/6 with an FiO2 of 30%  . General: Comfortable at this time . Eyes: Grossly normal lids, irises & conjunctiva . ENT: grossly tongue is normal . Neck: no obvious mass . Cardiovascular: S1 S2 normal no gallop . Respiratory: No rales or rhonchi noted . Abdomen: soft . Skin: no rash seen on limited exam . Musculoskeletal: not rigid . Psychiatric:unable to assess . Neurologic: no seizure no involuntary movements         Lab Data:   Basic Metabolic Panel: Recent Labs  Lab 09/30/18 0703 10/06/18 0605  NA 134* 146*  K 4.9 3.6  CL 89* 103  CO2 36* 27  GLUCOSE 130* 88  BUN 10 12  CREATININE 0.34* 0.55*  CALCIUM 9.8 8.3*    ABG: Recent Labs  Lab 09/30/18 1303 09/30/18 1830  PHART 7.200* 7.491*  PCO2ART 107* 48.2*  PO2ART 74.3* 61.0*  HCO3 40.2* 36.5*  O2SAT 90.3 92.4    Liver Function Tests: No results for input(s): AST, ALT, ALKPHOS, BILITOT, PROT, ALBUMIN in the last 168 hours. No results for input(s): LIPASE, AMYLASE in the last 168 hours. No results for input(s): AMMONIA in the last 168 hours.  CBC: Recent Labs  Lab 09/30/18 0703 10/06/18 0605  WBC 16.7* 8.9  HGB 10.0* 8.8*  HCT 34.4* 29.9*  MCV 88.4 86.7  PLT 471* 414*    Cardiac Enzymes: No results for input(s):  CKTOTAL, CKMB, CKMBINDEX, TROPONINI in the last 168 hours.  BNP (last 3 results) No results for input(s): BNP in the last 8760 hours.  ProBNP (last 3 results) No results for input(s): PROBNP in the last 8760 hours.  Radiological Exams: Dg Chest Port 1 View  Result Date: 10/05/2018 CLINICAL DATA:  Hypoxia.  Tracheostomy. EXAM: PORTABLE CHEST 1 VIEW COMPARISON:  September 30, 2018 FINDINGS: The tracheostomy tube is in stable position. No pneumothorax. Bilateral layering pleural effusions with underlying opacities, more pronounced in the interval. Cardiomediastinal silhouette is stable. The lungs are otherwise clear. IMPRESSION: 1. Small bilateral layering effusions with underlying opacities, more pronounced in the interval. 2. The more focal opacity in the right base was concerning for pneumonia on the previous study. This region is obscured on today's study. I suspect at least some atelectasis underlying the effusions. 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 Acute on chronic respiratory failure with hypoxia continue to attempt weaning to aerosol trach collar.  Currently on pressure support 12/6 with FiO2 of 30% satting well.  Continue aggressive pulmonary toilet and supportive measures.  2. Healthcare associated pneumonia the latest x-ray showing increased densities with possible resurgence of pneumonia will discuss with the primary care team 3. Acute transverse myelitis physical therapy as tolerated we will continue present management 4. Pulmonary embolism treated   I have personally seen and evaluated the patient, evaluated laboratory and imaging results, formulated the assessment and plan and placed orders. The  Patient requires high complexity decision making for assessment and support.  Case was discussed on Rounds with the Respiratory Therapy Staff  Allyne Gee, MD [facility] Pulmonary Critical Care Medicine Sleep Medicine

## 2018-10-07 DIAGNOSIS — J189 Pneumonia, unspecified organism: Secondary | ICD-10-CM | POA: Diagnosis not present

## 2018-10-07 DIAGNOSIS — J9621 Acute and chronic respiratory failure with hypoxia: Secondary | ICD-10-CM | POA: Diagnosis not present

## 2018-10-07 DIAGNOSIS — G373 Acute transverse myelitis in demyelinating disease of central nervous system: Secondary | ICD-10-CM | POA: Diagnosis not present

## 2018-10-07 DIAGNOSIS — I2699 Other pulmonary embolism without acute cor pulmonale: Secondary | ICD-10-CM | POA: Diagnosis not present

## 2018-10-07 LAB — PROTIME-INR
INR: 2.3 — ABNORMAL HIGH (ref 0.8–1.2)
Prothrombin Time: 25.2 seconds — ABNORMAL HIGH (ref 11.4–15.2)

## 2018-10-07 NOTE — Progress Notes (Signed)
Pulmonary Critical Care Medicine [place]   PULMONARY CRITICAL CARE SERVICE  PROGRESS NOTE  Date of Service: 10/07/2018  Benjamin Montoya  [id number]  DOB: January 20, 1971   DOA: 08/27/2018  Referring Physician: Merton Border, MD  HPI: Benjamin Montoya is a 48 y.o. male seen for follow up of Acute on Chronic Respiratory Failure.  Patient is on pressure support mode on 28% FiO2 12/5 was attempted on T collar this morning and was not able to tolerate  Medications: Reviewed on Rounds  Physical Exam:  Vitals: Temperature 98.8 pulse 105 respiratory 18 blood pressure 138/72 saturations 95%  Ventilator Settings mode ventilation pressure support FiO2 28% tidal volume 586 pressure poor 12 PEEP 5  . General: Comfortable at this time . Eyes: Grossly normal lids, irises & conjunctiva . ENT: grossly tongue is normal . Neck: no obvious mass . Cardiovascular: S1 S2 normal no gallop . Respiratory: No rhonchi no rales are noted . Abdomen: soft . Skin: no rash seen on limited exam . Musculoskeletal: not rigid . Psychiatric:unable to assess . Neurologic: no seizure no involuntary movements         Lab Data:   Basic Metabolic Panel: Recent Labs  Lab 10/06/18 0605  NA 146*  K 3.6  CL 103  CO2 27  GLUCOSE 88  BUN 12  CREATININE 0.55*  CALCIUM 8.3*    ABG: Recent Labs  Lab 09/30/18 1830  PHART 7.491*  PCO2ART 48.2*  PO2ART 61.0*  HCO3 36.5*  O2SAT 92.4    Liver Function Tests: No results for input(s): AST, ALT, ALKPHOS, BILITOT, PROT, ALBUMIN in the last 168 hours. No results for input(s): LIPASE, AMYLASE in the last 168 hours. No results for input(s): AMMONIA in the last 168 hours.  CBC: Recent Labs  Lab 10/06/18 0605  WBC 8.9  HGB 8.8*  HCT 29.9*  MCV 86.7  PLT 414*    Cardiac Enzymes: No results for input(s): CKTOTAL, CKMB, CKMBINDEX, TROPONINI in the last 168 hours.  BNP (last 3 results) No results for input(s): BNP in the last 8760  hours.  ProBNP (last 3 results) No results for input(s): PROBNP in the last 8760 hours.  Radiological Exams: No results found.  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 Acute on chronic respiratory failure with hypoxia patient is on pressure support will continue with 12/5 reassess the mechanics for further weaning to T collar 2. Acute transverse myelitis at baseline 3. Healthcare associated pneumonia treated we will continue to follow 4. Pulmonary embolism treated we will continue with supportive care   I have personally seen and evaluated the patient, evaluated laboratory and imaging results, formulated the assessment and plan and placed orders. The Patient requires high complexity decision making for assessment and support.  Case was discussed on Rounds with the Respiratory Therapy Staff  Allyne Gee, MD [facility] Pulmonary Critical Care Medicine Sleep Medicine

## 2018-10-08 DIAGNOSIS — I2699 Other pulmonary embolism without acute cor pulmonale: Secondary | ICD-10-CM | POA: Diagnosis not present

## 2018-10-08 DIAGNOSIS — G373 Acute transverse myelitis in demyelinating disease of central nervous system: Secondary | ICD-10-CM | POA: Diagnosis not present

## 2018-10-08 DIAGNOSIS — J189 Pneumonia, unspecified organism: Secondary | ICD-10-CM | POA: Diagnosis not present

## 2018-10-08 DIAGNOSIS — J9621 Acute and chronic respiratory failure with hypoxia: Secondary | ICD-10-CM | POA: Diagnosis not present

## 2018-10-08 LAB — PROTIME-INR
INR: 2.4 — ABNORMAL HIGH (ref 0.8–1.2)
Prothrombin Time: 25.6 seconds — ABNORMAL HIGH (ref 11.4–15.2)

## 2018-10-08 NOTE — Progress Notes (Addendum)
Pulmonary Critical Care Medicine [place]   PULMONARY CRITICAL CARE SERVICE  PROGRESS NOTE  Date of Service: 10/08/2018  Fischer Halley  [id number]  DOB: Dec 17, 1970   DOA: 08/27/2018  Referring Physician: Merton Border, MD  HPI: Jeffie Spivack is a 48 y.o. male seen for follow up of Acute on Chronic Respiratory Failure.  Patient remains on pressure support today 12/5 with an FiO2 of 40% for 24-hour goal.  Patient needed 20 hours yesterday with no difficulty.  Medications: Reviewed on Rounds  Physical Exam:  Vitals: Pulse 104 respirations 17 BP 160/90 O2 sat 94% and 96.7  Ventilator Settings pressure support 12/5 FiO2 40%  . General: Comfortable at this time . Eyes: Grossly normal lids, irises & conjunctiva . ENT: grossly tongue is normal . Neck: no obvious mass . Cardiovascular: S1 S2 normal no gallop . Respiratory: No rales or rhonchi noted . Abdomen: soft . Skin: no rash seen on limited exam . Musculoskeletal: not rigid . Psychiatric:unable to assess . Neurologic: no seizure no involuntary movements         Lab Data:   Basic Metabolic Panel: Recent Labs  Lab 10/06/18 0605  NA 146*  K 3.6  CL 103  CO2 27  GLUCOSE 88  BUN 12  CREATININE 0.55*  CALCIUM 8.3*    ABG: No results for input(s): PHART, PCO2ART, PO2ART, HCO3, O2SAT in the last 168 hours.  Liver Function Tests: No results for input(s): AST, ALT, ALKPHOS, BILITOT, PROT, ALBUMIN in the last 168 hours. No results for input(s): LIPASE, AMYLASE in the last 168 hours. No results for input(s): AMMONIA in the last 168 hours.  CBC: Recent Labs  Lab 10/06/18 0605  WBC 8.9  HGB 8.8*  HCT 29.9*  MCV 86.7  PLT 414*    Cardiac Enzymes: No results for input(s): CKTOTAL, CKMB, CKMBINDEX, TROPONINI in the last 168 hours.  BNP (last 3 results) No results for input(s): BNP in the last 8760 hours.  ProBNP (last 3 results) No results for input(s): PROBNP in the last 8760  hours.  Radiological Exams: No results found.  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 Acute on chronic respiratory failure with hypoxia patient is on pressure support will continue with 12/5 and 40%.  Patient continue present pulmonary toilet and supportive measures. 2. Acute transverse myelitis at baseline 3. Healthcare associated pneumonia treated we will continue to follow 4. Pulmonary embolism treated we will continue with supportive care   I have personally seen and evaluated the patient, evaluated laboratory and imaging results, formulated the assessment and plan and placed orders. The Patient requires high complexity decision making for assessment and support.  Case was discussed on Rounds with the Respiratory Therapy Staff  Allyne Gee, MD [facility] Pulmonary Critical Care Medicine Sleep Medicine

## 2018-10-09 DIAGNOSIS — I2699 Other pulmonary embolism without acute cor pulmonale: Secondary | ICD-10-CM | POA: Diagnosis not present

## 2018-10-09 DIAGNOSIS — J9621 Acute and chronic respiratory failure with hypoxia: Secondary | ICD-10-CM | POA: Diagnosis not present

## 2018-10-09 DIAGNOSIS — G373 Acute transverse myelitis in demyelinating disease of central nervous system: Secondary | ICD-10-CM | POA: Diagnosis not present

## 2018-10-09 DIAGNOSIS — J189 Pneumonia, unspecified organism: Secondary | ICD-10-CM | POA: Diagnosis not present

## 2018-10-09 LAB — PROTIME-INR
INR: 2.8 — ABNORMAL HIGH (ref 0.8–1.2)
Prothrombin Time: 28.8 seconds — ABNORMAL HIGH (ref 11.4–15.2)

## 2018-10-09 NOTE — Progress Notes (Addendum)
Pulmonary Critical Care Medicine [place]   PULMONARY CRITICAL CARE SERVICE  PROGRESS NOTE  Date of Service: 10/09/2018  Benjamin Montoya  [id number]  DOB: Jul 24, 1970   DOA: 08/27/2018  Referring Physician: Merton Border, MD  HPI: Keath Matera is a 48 y.o. male seen for follow up of Acute on Chronic Respiratory Failure.  Patient remains on aerosol trach collar 40 and FiO2 for 12-hour goal today.  Medications: Reviewed on Rounds  Physical Exam:  Vitals: Pulse 89 respirations 18 BP 136/67 O2 sat 99% temp 97.8  Ventilator Settings ATC 40%  . General: Comfortable at this time . Eyes: Grossly normal lids, irises & conjunctiva . ENT: grossly tongue is normal . Neck: no obvious mass . Cardiovascular: S1 S2 normal no gallop . Respiratory: No rales or rhonchi noted . Abdomen: soft . Skin: no rash seen on limited exam . Musculoskeletal: not rigid . Psychiatric:unable to assess . Neurologic: no seizure no involuntary movements         Lab Data:   Basic Metabolic Panel: Recent Labs  Lab 10/06/18 0605  NA 146*  K 3.6  CL 103  CO2 27  GLUCOSE 88  BUN 12  CREATININE 0.55*  CALCIUM 8.3*    ABG: No results for input(s): PHART, PCO2ART, PO2ART, HCO3, O2SAT in the last 168 hours.  Liver Function Tests: No results for input(s): AST, ALT, ALKPHOS, BILITOT, PROT, ALBUMIN in the last 168 hours. No results for input(s): LIPASE, AMYLASE in the last 168 hours. No results for input(s): AMMONIA in the last 168 hours.  CBC: Recent Labs  Lab 10/06/18 0605  WBC 8.9  HGB 8.8*  HCT 29.9*  MCV 86.7  PLT 414*    Cardiac Enzymes: No results for input(s): CKTOTAL, CKMB, CKMBINDEX, TROPONINI in the last 168 hours.  BNP (last 3 results) No results for input(s): BNP in the last 8760 hours.  ProBNP (last 3 results) No results for input(s): PROBNP in the last 8760 hours.  Radiological Exams: No results found.  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 Acute on chronic respiratory failure with hypoxia patient has weaned aerosol trach collar 40% at this time for 12-hour goal today.  Currently satting well no distress.  Continue aggressive [place] toilet and supportive measures.  Continue to wean as tolerated. 2. Acute transverse myelitis at baseline 3. Healthcare associated pneumonia treated we will continue to follow 4. Pulmonary embolism treated we will continue with supportive care   I have personally seen and evaluated the patient, evaluated laboratory and imaging results, formulated the assessment and plan and placed orders. The Patient requires high complexity decision making for assessment and support.  Case was discussed on Rounds with the Respiratory Therapy Staff  Allyne Gee, MD [facility] Pulmonary Critical Care Medicine Sleep Medicine

## 2018-10-10 DIAGNOSIS — G373 Acute transverse myelitis in demyelinating disease of central nervous system: Secondary | ICD-10-CM | POA: Diagnosis not present

## 2018-10-10 DIAGNOSIS — I2699 Other pulmonary embolism without acute cor pulmonale: Secondary | ICD-10-CM | POA: Diagnosis not present

## 2018-10-10 DIAGNOSIS — J189 Pneumonia, unspecified organism: Secondary | ICD-10-CM | POA: Diagnosis not present

## 2018-10-10 DIAGNOSIS — J9621 Acute and chronic respiratory failure with hypoxia: Secondary | ICD-10-CM | POA: Diagnosis not present

## 2018-10-10 LAB — CBC
HCT: 31.4 % — ABNORMAL LOW (ref 39.0–52.0)
Hemoglobin: 9.2 g/dL — ABNORMAL LOW (ref 13.0–17.0)
MCH: 25.3 pg — ABNORMAL LOW (ref 26.0–34.0)
MCHC: 29.3 g/dL — ABNORMAL LOW (ref 30.0–36.0)
MCV: 86.3 fL (ref 80.0–100.0)
Platelets: 448 10*3/uL — ABNORMAL HIGH (ref 150–400)
RBC: 3.64 MIL/uL — ABNORMAL LOW (ref 4.22–5.81)
RDW: 16.3 % — ABNORMAL HIGH (ref 11.5–15.5)
WBC: 10.5 10*3/uL (ref 4.0–10.5)
nRBC: 0 % (ref 0.0–0.2)

## 2018-10-10 LAB — PROTIME-INR
INR: 2.2 — ABNORMAL HIGH (ref 0.8–1.2)
Prothrombin Time: 24.1 seconds — ABNORMAL HIGH (ref 11.4–15.2)

## 2018-10-10 LAB — BASIC METABOLIC PANEL
Anion gap: 11 (ref 5–15)
BUN: 9 mg/dL (ref 6–20)
CO2: 30 mmol/L (ref 22–32)
Calcium: 9.2 mg/dL (ref 8.9–10.3)
Chloride: 98 mmol/L (ref 98–111)
Creatinine, Ser: 0.32 mg/dL — ABNORMAL LOW (ref 0.61–1.24)
GFR calc Af Amer: >60 mL/min (ref >60)
GFR calc non Af Amer: >60 mL/min (ref >60)
Glucose, Bld: 106 mg/dL — ABNORMAL HIGH (ref 70–99)
Potassium: 3.3 mmol/L — ABNORMAL LOW (ref 3.5–5.1)
Sodium: 139 mmol/L (ref 135–145)

## 2018-10-10 NOTE — Progress Notes (Addendum)
Pulmonary Critical Care Medicine [place]   PULMONARY CRITICAL CARE SERVICE  PROGRESS NOTE  Date of Service: 10/10/2018  Benjamin Montoya  [id number]  DOB: Feb 15, 1971   DOA: 08/27/2018  Referring Physician: Merton Border, MD  HPI: Benjamin Montoya is a 48 y.o. male seen for follow up of Acute on Chronic Respiratory Failure.  Patient continues on 40% aerosol trach collar with a 16-hour goal today.  Medications: Reviewed on Rounds  Physical Exam:  Vitals: Pulse 71 respirations 18 BP one 8/68 O2 sat 97% 98.6  Ventilator Settings ATC 40%  . General: Comfortable at this time . Eyes: Grossly normal lids, irises & conjunctiva . ENT: grossly tongue is normal . Neck: no obvious mass . Cardiovascular: S1 S2 normal no gallop . Respiratory: No rales or rhonchi noted . Abdomen: soft . Skin: no rash seen on limited exam . Musculoskeletal: not rigid . Psychiatric:unable to assess . Neurologic: no seizure no involuntary movements         Lab Data:   Basic Metabolic Panel: Recent Labs  Lab 10/06/18 0605 10/10/18 1033  NA 146* 139  K 3.6 3.3*  CL 103 98  CO2 27 30  GLUCOSE 88 106*  BUN 12 9  CREATININE 0.55* 0.32*  CALCIUM 8.3* 9.2    ABG: No results for input(s): PHART, PCO2ART, PO2ART, HCO3, O2SAT in the last 168 hours.  Liver Function Tests: No results for input(s): AST, ALT, ALKPHOS, BILITOT, PROT, ALBUMIN in the last 168 hours. No results for input(s): LIPASE, AMYLASE in the last 168 hours. No results for input(s): AMMONIA in the last 168 hours.  CBC: Recent Labs  Lab 10/06/18 0605 10/10/18 1033  WBC 8.9 10.5  HGB 8.8* 9.2*  HCT 29.9* 31.4*  MCV 86.7 86.3  PLT 414* 448*    Cardiac Enzymes: No results for input(s): CKTOTAL, CKMB, CKMBINDEX, TROPONINI in the last 168 hours.  BNP (last 3 results) No results for input(s): BNP in the last 8760 hours.  ProBNP (last 3 results) No results for input(s): PROBNP in the last 8760  hours.  Radiological Exams: No results found.  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 Acute on chronic respiratory failure with hypoxia patient has weaned aerosol trach collar 40% at this time for 16-hour goal today.  Currently satting well no distress.  Continue aggressive [place] toilet and supportive measures.  Continue to wean as tolerated. 2. Acute transverse myelitis at baseline 3. Healthcare associated pneumonia treated we will continue to follow 4. Pulmonary embolism treated we will continue with supportive care   I have personally seen and evaluated the patient, evaluated laboratory and imaging results, formulated the assessment and plan and placed orders. The Patient requires high complexity decision making for assessment and support.  Case was discussed on Rounds with the Respiratory Therapy Staff  Allyne Gee, MD [facility] Pulmonary Critical Care Medicine Sleep Medicine

## 2018-10-11 DIAGNOSIS — J189 Pneumonia, unspecified organism: Secondary | ICD-10-CM | POA: Diagnosis not present

## 2018-10-11 DIAGNOSIS — G373 Acute transverse myelitis in demyelinating disease of central nervous system: Secondary | ICD-10-CM | POA: Diagnosis not present

## 2018-10-11 DIAGNOSIS — I2699 Other pulmonary embolism without acute cor pulmonale: Secondary | ICD-10-CM | POA: Diagnosis not present

## 2018-10-11 DIAGNOSIS — J9621 Acute and chronic respiratory failure with hypoxia: Secondary | ICD-10-CM | POA: Diagnosis not present

## 2018-10-11 LAB — BASIC METABOLIC PANEL
Anion gap: 9 (ref 5–15)
BUN: 11 mg/dL (ref 6–20)
CO2: 31 mmol/L (ref 22–32)
Calcium: 9.2 mg/dL (ref 8.9–10.3)
Chloride: 101 mmol/L (ref 98–111)
Creatinine, Ser: <0.3 mg/dL — ABNORMAL LOW (ref 0.61–1.24)
Glucose, Bld: 113 mg/dL — ABNORMAL HIGH (ref 70–99)
Potassium: 3.5 mmol/L (ref 3.5–5.1)
Sodium: 141 mmol/L (ref 135–145)

## 2018-10-11 LAB — CBC
HCT: 30.2 % — ABNORMAL LOW (ref 39.0–52.0)
Hemoglobin: 9 g/dL — ABNORMAL LOW (ref 13.0–17.0)
MCH: 25.4 pg — ABNORMAL LOW (ref 26.0–34.0)
MCHC: 29.8 g/dL — ABNORMAL LOW (ref 30.0–36.0)
MCV: 85.1 fL (ref 80.0–100.0)
Platelets: 458 10*3/uL — ABNORMAL HIGH (ref 150–400)
RBC: 3.55 MIL/uL — ABNORMAL LOW (ref 4.22–5.81)
RDW: 15.9 % — ABNORMAL HIGH (ref 11.5–15.5)
WBC: 9.6 10*3/uL (ref 4.0–10.5)
nRBC: 0 % (ref 0.0–0.2)

## 2018-10-11 LAB — PROTIME-INR
INR: 1.9 — ABNORMAL HIGH (ref 0.8–1.2)
Prothrombin Time: 21.8 seconds — ABNORMAL HIGH (ref 11.4–15.2)

## 2018-10-11 NOTE — Progress Notes (Signed)
Pulmonary Critical Care Medicine [place]   PULMONARY CRITICAL CARE SERVICE  PROGRESS NOTE  Date of Service: 10/11/2018  Benjamin Montoya  [id number]:[id number]  DOB: 08/29/1970   DOA: 08/27/2018  Referring Physician: Merton Border, MD  HPI: Benjamin Montoya is a 48 y.o. male seen for follow up of Acute on Chronic Respiratory Failure.  Patient is on T collar right now has been on 35% FiO2 goal today is for 20 hours  Medications: Reviewed on Rounds  Physical Exam:  Vitals: Temperature 98.6 pulse 82 respiratory 18 blood pressure 165/64 saturations 96%  Ventilator Settings off the ventilator on T collar  . General: Comfortable at this time . Eyes: Grossly normal lids, irises & conjunctiva . ENT: grossly tongue is normal . Neck: no obvious mass . Cardiovascular: S1 S2 normal no gallop . Respiratory: No rhonchi no rales are noted at this time . Abdomen: soft . Skin: no rash seen on limited exam . Musculoskeletal: not rigid . Psychiatric:unable to assess . Neurologic: no seizure no involuntary movements         Lab Data:   Basic Metabolic Panel: Recent Labs  Lab 10/06/18 0605 10/10/18 1033 10/11/18 0617  NA 146* 139 141  K 3.6 3.3* 3.5  CL 103 98 101  CO2 27 30 31   GLUCOSE 88 106* 113*  BUN 12 9 11   CREATININE 0.55* 0.32* <0.30*  CALCIUM 8.3* 9.2 9.2    ABG: No results for input(s): PHART, PCO2ART, PO2ART, HCO3, O2SAT in the last 168 hours.  Liver Function Tests: No results for input(s): AST, ALT, ALKPHOS, BILITOT, PROT, ALBUMIN in the last 168 hours. No results for input(s): LIPASE, AMYLASE in the last 168 hours. No results for input(s): AMMONIA in the last 168 hours.  CBC: Recent Labs  Lab 10/06/18 0605 10/10/18 1033 10/11/18 0617  WBC 8.9 10.5 9.6  HGB 8.8* 9.2* 9.0*  HCT 29.9* 31.4* 30.2*  MCV 86.7 86.3 85.1  PLT 414* 448* 458*    Cardiac Enzymes: No results for input(s): CKTOTAL, CKMB, CKMBINDEX, TROPONINI in the last 168 hours.  BNP  (last 3 results) No results for input(s): BNP in the last 8760 hours.  ProBNP (last 3 results) No results for input(s): PROBNP in the last 8760 hours.  Radiological Exams: No results found.  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 Acute on chronic respiratory failure with hypoxia we will continue with T collar trials patient right now is on 35% FiO2 goal is already mentioned is 20 hours 2. Transverse myelitis at baseline we will continue with supportive care 3. Healthcare associated pneumonia treated we will continue to follow 4. Pulmonary embolism at baseline   I have personally seen and evaluated the patient, evaluated laboratory and imaging results, formulated the assessment and plan and placed orders. The Patient requires high complexity decision making for assessment and support.  Case was discussed on Rounds with the Respiratory Therapy Staff  Allyne Gee, MD [facility] Pulmonary Critical Care Medicine Sleep Medicine

## 2018-10-12 DIAGNOSIS — J9621 Acute and chronic respiratory failure with hypoxia: Secondary | ICD-10-CM | POA: Diagnosis not present

## 2018-10-12 DIAGNOSIS — I2699 Other pulmonary embolism without acute cor pulmonale: Secondary | ICD-10-CM | POA: Diagnosis not present

## 2018-10-12 DIAGNOSIS — G373 Acute transverse myelitis in demyelinating disease of central nervous system: Secondary | ICD-10-CM | POA: Diagnosis not present

## 2018-10-12 DIAGNOSIS — J189 Pneumonia, unspecified organism: Secondary | ICD-10-CM | POA: Diagnosis not present

## 2018-10-12 LAB — PROTIME-INR
INR: 1.7 — ABNORMAL HIGH (ref 0.8–1.2)
Prothrombin Time: 19.6 seconds — ABNORMAL HIGH (ref 11.4–15.2)

## 2018-10-12 NOTE — Progress Notes (Signed)
Pulmonary Critical Care Medicine [place]   PULMONARY CRITICAL CARE SERVICE  PROGRESS NOTE  Date of Service: 10/12/2018  Benjamin Montoya  [id number]  DOB: 09/21/1970   DOA: 08/27/2018  Referring Physician: Merton Border, MD  HPI: Benjamin Montoya is a 48 y.o. male seen for follow up of Acute on Chronic Respiratory Failure.  Patient is on T collar right now has been on 40% FiO2 for goal is for 24 hours  Medications: Reviewed on Rounds  Physical Exam:  Vitals: Temperature 97.3 pulse 73 respiratory 18 blood pressure 114/68 saturations 92%  Ventilator Settings off the ventilator right now on T collar  . General: Comfortable at this time . Eyes: Grossly normal lids, irises & conjunctiva . ENT: grossly tongue is normal . Neck: no obvious mass . Cardiovascular: S1 S2 normal no gallop . Respiratory: No rhonchi no rales are noted . Abdomen: soft . Skin: no rash seen on limited exam . Musculoskeletal: not rigid . Psychiatric:unable to assess . Neurologic: no seizure no involuntary movements         Lab Data:   Basic Metabolic Panel: Recent Labs  Lab 10/06/18 0605 10/10/18 1033 10/11/18 0617  NA 146* 139 141  K 3.6 3.3* 3.5  CL 103 98 101  CO2 27 30 31   GLUCOSE 88 106* 113*  BUN 12 9 11   CREATININE 0.55* 0.32* <0.30*  CALCIUM 8.3* 9.2 9.2    ABG: No results for input(s): PHART, PCO2ART, PO2ART, HCO3, O2SAT in the last 168 hours.  Liver Function Tests: No results for input(s): AST, ALT, ALKPHOS, BILITOT, PROT, ALBUMIN in the last 168 hours. No results for input(s): LIPASE, AMYLASE in the last 168 hours. No results for input(s): AMMONIA in the last 168 hours.  CBC: Recent Labs  Lab 10/06/18 0605 10/10/18 1033 10/11/18 0617  WBC 8.9 10.5 9.6  HGB 8.8* 9.2* 9.0*  HCT 29.9* 31.4* 30.2*  MCV 86.7 86.3 85.1  PLT 414* 448* 458*    Cardiac Enzymes: No results for input(s): CKTOTAL, CKMB, CKMBINDEX, TROPONINI in the last 168 hours.  BNP (last  3 results) No results for input(s): BNP in the last 8760 hours.  ProBNP (last 3 results) No results for input(s): PROBNP in the last 8760 hours.  Radiological Exams: No results found.  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 Acute on chronic respiratory failure with hypoxia we will continue with T collar weaning should be able to meet the goal at 24 hours.  Patient still however may need nocturnal ventilator support we will see how he does 2. Acute transverse myelitis we will continue with supportive care 3. Healthcare associated pneumonia treated resolved 4. Pulmonary embolism treated we will continue with present management   I have personally seen and evaluated the patient, evaluated laboratory and imaging results, formulated the assessment and plan and placed orders. The Patient requires high complexity decision making for assessment and support.  Case was discussed on Rounds with the Respiratory Therapy Staff  Allyne Gee, MD [facility] Pulmonary Critical Care Medicine Sleep Medicine

## 2018-10-13 DIAGNOSIS — J9621 Acute and chronic respiratory failure with hypoxia: Secondary | ICD-10-CM | POA: Diagnosis not present

## 2018-10-13 DIAGNOSIS — I2699 Other pulmonary embolism without acute cor pulmonale: Secondary | ICD-10-CM | POA: Diagnosis not present

## 2018-10-13 DIAGNOSIS — G373 Acute transverse myelitis in demyelinating disease of central nervous system: Secondary | ICD-10-CM | POA: Diagnosis not present

## 2018-10-13 DIAGNOSIS — J189 Pneumonia, unspecified organism: Secondary | ICD-10-CM | POA: Diagnosis not present

## 2018-10-13 LAB — BLOOD GAS, ARTERIAL
Acid-Base Excess: 9 mmol/L — ABNORMAL HIGH (ref 0.0–2.0)
Bicarbonate: 33.7 mmol/L — ABNORMAL HIGH (ref 20.0–28.0)
FIO2: 35
O2 Saturation: 95.5 %
Patient temperature: 98.6
pCO2 arterial: 52 mmHg — ABNORMAL HIGH (ref 32.0–48.0)
pH, Arterial: 7.427 (ref 7.350–7.450)
pO2, Arterial: 78.2 mmHg — ABNORMAL LOW (ref 83.0–108.0)

## 2018-10-13 LAB — PROTIME-INR
INR: 1.4 — ABNORMAL HIGH (ref 0.8–1.2)
Prothrombin Time: 17.2 seconds — ABNORMAL HIGH (ref 11.4–15.2)

## 2018-10-13 NOTE — Progress Notes (Signed)
Pulmonary Critical Care Medicine [place]   PULMONARY CRITICAL CARE SERVICE  PROGRESS NOTE  Date of Service: 10/13/2018  Benjamin Montoya  [id number]  DOB: 1970/06/30   DOA: 08/27/2018  Referring Physician: Merton Border, MD  HPI: Benjamin Montoya is a 48 y.o. male seen for follow up of Acute on Chronic Respiratory Failure.  Patient is on T collar has been on 48 hours will be the goal  Medications: Reviewed on Rounds  Physical Exam:  Vitals: Temperature 97.9 pulse 89 respiratory 18 blood pressure 102/62 saturations 97%  Ventilator Settings off the ventilator on T collar  . General: Comfortable at this time . Eyes: Grossly normal lids, irises & conjunctiva . ENT: grossly tongue is normal . Neck: no obvious mass . Cardiovascular: S1 S2 normal no gallop . Respiratory: No rhonchi no rales are noted at this time . Abdomen: soft . Skin: no rash seen on limited exam . Musculoskeletal: not rigid . Psychiatric:unable to assess . Neurologic: no seizure no involuntary movements         Lab Data:   Basic Metabolic Panel: Recent Labs  Lab 10/10/18 1033 10/11/18 0617  NA 139 141  K 3.3* 3.5  CL 98 101  CO2 30 31  GLUCOSE 106* 113*  BUN 9 11  CREATININE 0.32* <0.30*  CALCIUM 9.2 9.2    ABG: Recent Labs  Lab 10/13/18 1125  PHART 7.427  PCO2ART 52.0*  PO2ART 78.2*  HCO3 33.7*  O2SAT 95.5    Liver Function Tests: No results for input(s): AST, ALT, ALKPHOS, BILITOT, PROT, ALBUMIN in the last 168 hours. No results for input(s): LIPASE, AMYLASE in the last 168 hours. No results for input(s): AMMONIA in the last 168 hours.  CBC: Recent Labs  Lab 10/10/18 1033 10/11/18 0617  WBC 10.5 9.6  HGB 9.2* 9.0*  HCT 31.4* 30.2*  MCV 86.3 85.1  PLT 448* 458*    Cardiac Enzymes: No results for input(s): CKTOTAL, CKMB, CKMBINDEX, TROPONINI in the last 168 hours.  BNP (last 3 results) No results for input(s): BNP in the last 8760 hours.  ProBNP  (last 3 results) No results for input(s): PROBNP in the last 8760 hours.  Radiological Exams: No results found.  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 Acute on chronic respiratory failure with hypoxia continue with the T collar wean patient is tolerating well 2. Acute transverse myelitis continue with supportive care 3. Healthcare associated pneumonia treated improved 4. Pulmonary embolism at baseline we will continue with present management   I have personally seen and evaluated the patient, evaluated laboratory and imaging results, formulated the assessment and plan and placed orders. The Patient requires high complexity decision making for assessment and support.  Case was discussed on Rounds with the Respiratory Therapy Staff  Allyne Gee, MD [facility] Pulmonary Critical Care Medicine Sleep Medicine

## 2018-10-14 ENCOUNTER — Other Ambulatory Visit (HOSPITAL_COMMUNITY): Payer: Self-pay

## 2018-10-14 DIAGNOSIS — J9621 Acute and chronic respiratory failure with hypoxia: Secondary | ICD-10-CM | POA: Diagnosis not present

## 2018-10-14 DIAGNOSIS — I2699 Other pulmonary embolism without acute cor pulmonale: Secondary | ICD-10-CM | POA: Diagnosis not present

## 2018-10-14 DIAGNOSIS — G373 Acute transverse myelitis in demyelinating disease of central nervous system: Secondary | ICD-10-CM | POA: Diagnosis not present

## 2018-10-14 DIAGNOSIS — J189 Pneumonia, unspecified organism: Secondary | ICD-10-CM | POA: Diagnosis not present

## 2018-10-14 LAB — CBC
HCT: 36.1 % — ABNORMAL LOW (ref 39.0–52.0)
Hemoglobin: 10.7 g/dL — ABNORMAL LOW (ref 13.0–17.0)
MCH: 25.3 pg — ABNORMAL LOW (ref 26.0–34.0)
MCHC: 29.6 g/dL — ABNORMAL LOW (ref 30.0–36.0)
MCV: 85.3 fL (ref 80.0–100.0)
Platelets: 512 10*3/uL — ABNORMAL HIGH (ref 150–400)
RBC: 4.23 MIL/uL (ref 4.22–5.81)
RDW: 16.5 % — ABNORMAL HIGH (ref 11.5–15.5)
WBC: 14.5 10*3/uL — ABNORMAL HIGH (ref 4.0–10.5)
nRBC: 0 % (ref 0.0–0.2)

## 2018-10-14 LAB — PROTIME-INR
INR: 1.5 — ABNORMAL HIGH (ref 0.8–1.2)
Prothrombin Time: 17.8 seconds — ABNORMAL HIGH (ref 11.4–15.2)

## 2018-10-14 LAB — BASIC METABOLIC PANEL
Anion gap: 12 (ref 5–15)
BUN: 15 mg/dL (ref 6–20)
CO2: 30 mmol/L (ref 22–32)
Calcium: 9.4 mg/dL (ref 8.9–10.3)
Chloride: 91 mmol/L — ABNORMAL LOW (ref 98–111)
Creatinine, Ser: 0.36 mg/dL — ABNORMAL LOW (ref 0.61–1.24)
GFR calc Af Amer: >60 mL/min (ref >60)
GFR calc non Af Amer: >60 mL/min (ref >60)
Glucose, Bld: 95 mg/dL (ref 70–99)
Potassium: 4.7 mmol/L (ref 3.5–5.1)
Sodium: 133 mmol/L — ABNORMAL LOW (ref 135–145)

## 2018-10-14 NOTE — Progress Notes (Addendum)
Pulmonary Critical Care Medicine [place]   PULMONARY CRITICAL CARE SERVICE  PROGRESS NOTE  Date of Service: 10/14/2018  Benjamin Montoya  [id number]  DOB: [birth year]   DOA: 08/27/2018  Referring Physician: Merton Border, MD  HPI: Benjamin Montoya is a 48 y.o. male seen for follow up of Acute on Chronic Respiratory Failure.  Patient will continue with aerosol trach collar 30% during the day resting on the vent at night.  Currently satting well with no distress.  Medications: Reviewed on Rounds  Physical Exam:  Vitals: Pulse 105 respirations 18 BP 152/73 O2 sat 97% temp 99.4  Ventilator Settings ATC 30%  . General: Comfortable at this time . Eyes: Grossly normal lids, irises & conjunctiva . ENT: grossly tongue is normal . Neck: no obvious mass . Cardiovascular: S1 S2 normal no gallop . Respiratory: No rales or rhonchi noted . Abdomen: soft . Skin: no rash seen on limited exam . Musculoskeletal: not rigid . Psychiatric:unable to assess . Neurologic: no seizure no involuntary movements         Lab Data:   Basic Metabolic Panel: Recent Labs  Lab 10/10/18 1033 10/11/18 0617 10/14/18 0406  NA 139 141 133*  K 3.3* 3.5 4.7  CL 98 101 91*  CO2 30 31 30   GLUCOSE 106* 113* 95  BUN 9 11 15   CREATININE 0.32* <0.30* 0.36*  CALCIUM 9.2 9.2 9.4    ABG: Recent Labs  Lab 10/13/18 1125  PHART 7.427  PCO2ART 52.0*  PO2ART 78.2*  HCO3 33.7*  O2SAT 95.5    Liver Function Tests: No results for input(s): AST, ALT, ALKPHOS, BILITOT, PROT, ALBUMIN in the last 168 hours. No results for input(s): LIPASE, AMYLASE in the last 168 hours. No results for input(s): AMMONIA in the last 168 hours.  CBC: Recent Labs  Lab 10/10/18 1033 10/11/18 0617 10/14/18 0406  WBC 10.5 9.6 14.5*  HGB 9.2* 9.0* 10.7*  HCT 31.4* 30.2* 36.1*  MCV 86.3 85.1 85.3  PLT 448* 458* 512*    Cardiac Enzymes: No results for input(s): CKTOTAL, CKMB, CKMBINDEX, TROPONINI in the  last 168 hours.  BNP (last 3 results) No results for input(s): BNP in the last 8760 hours.  ProBNP (last 3 results) No results for input(s): PROBNP in the last 8760 hours.  Radiological Exams: Dg Chest Port 1 View  Result Date: 10/14/2018 CLINICAL DATA:  Respiratory failure EXAM: PORTABLE CHEST 1 VIEW COMPARISON:  10/05/2018 FINDINGS: Low volume chest with hazy opacity at the bases. Mild improvement at the left base where the diaphragm is now better visualized. Stable heart size that is likely normal for technique. Tracheostomy tube in place. IMPRESSION: Continued hazy opacity at the bases likely reflecting atelectasis. Mild improvement in aeration on the left. Electronically Signed   By: Monte Fantasia M.D.   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 Acute on chronic respiratory failure with hypoxia patient will continue aerosol trach collar 30% during the day and resting on the ventilator at night.  Patient will continue supportive measures and pulmonary toilet. 2. Acute transverse myelitis continue with supportive care 3. Healthcare associated pneumonia treated improved 4. Pulmonary embolism at baseline we will continue with present management   I have personally seen and evaluated the patient, evaluated laboratory and imaging results,  formulated the assessment and plan and placed orders. The Patient requires high complexity decision making for assessment and support.  Case was discussed on Rounds with the Respiratory Therapy Staff  Allyne Gee, MD [facility] Pulmonary Critical Care Medicine Sleep Medicine

## 2018-10-15 DIAGNOSIS — G373 Acute transverse myelitis in demyelinating disease of central nervous system: Secondary | ICD-10-CM | POA: Diagnosis not present

## 2018-10-15 DIAGNOSIS — I2699 Other pulmonary embolism without acute cor pulmonale: Secondary | ICD-10-CM | POA: Diagnosis not present

## 2018-10-15 DIAGNOSIS — J189 Pneumonia, unspecified organism: Secondary | ICD-10-CM | POA: Diagnosis not present

## 2018-10-15 DIAGNOSIS — J9621 Acute and chronic respiratory failure with hypoxia: Secondary | ICD-10-CM | POA: Diagnosis not present

## 2018-10-15 LAB — VANCOMYCIN, TROUGH: Vancomycin Tr: 13 ug/mL — ABNORMAL LOW (ref 15–20)

## 2018-10-15 LAB — PROTIME-INR
INR: 1.4 — ABNORMAL HIGH (ref 0.8–1.2)
Prothrombin Time: 16.7 seconds — ABNORMAL HIGH (ref 11.4–15.2)

## 2018-10-15 NOTE — Progress Notes (Addendum)
Pulmonary Critical Care Medicine [place]   PULMONARY CRITICAL CARE SERVICE  PROGRESS NOTE  Date of Service: 10/15/2018  Benjamin Montoya  [id number]  DOB: 05/30/1970   DOA: 08/27/2018  Referring Physician: Merton Border, MD  HPI: Benjamin Montoya is a 48 y.o. male seen for follow up of Acute on Chronic Respiratory Failure.  Patient continues on aerosol trach collar 35% 5 tube with a moderate to large no secretions.  Patient's cultures were sent yesterday.  Medications: Reviewed on Rounds  Physical Exam:  Vitals: Pulse 80 respirations 12 BP 124/61 O2 sat 98% temp 96.8  Ventilator Settings ATC 35%  . General: Comfortable at this time . Eyes: Grossly normal lids, irises & conjunctiva . ENT: grossly tongue is normal . Neck: no obvious mass . Cardiovascular: S1 S2 normal no gallop . Respiratory: No rales or rhonchi noted . Abdomen: soft . Skin: no rash seen on limited exam . Musculoskeletal: not rigid . Psychiatric:unable to assess . Neurologic: no seizure no involuntary movements         Lab Data:   Basic Metabolic Panel: Recent Labs  Lab 10/10/18 1033 10/11/18 0617 10/14/18 0406  NA 139 141 133*  K 3.3* 3.5 4.7  CL 98 101 91*  CO2 30 31 30   GLUCOSE 106* 113* 95  BUN 9 11 15   CREATININE 0.32* <0.30* 0.36*  CALCIUM 9.2 9.2 9.4    ABG: Recent Labs  Lab 10/13/18 1125  PHART 7.427  PCO2ART 52.0*  PO2ART 78.2*  HCO3 33.7*  O2SAT 95.5    Liver Function Tests: No results for input(s): AST, ALT, ALKPHOS, BILITOT, PROT, ALBUMIN in the last 168 hours. No results for input(s): LIPASE, AMYLASE in the last 168 hours. No results for input(s): AMMONIA in the last 168 hours.  CBC: Recent Labs  Lab 10/10/18 1033 10/11/18 0617 10/14/18 0406  WBC 10.5 9.6 14.5*  HGB 9.2* 9.0* 10.7*  HCT 31.4* 30.2* 36.1*  MCV 86.3 85.1 85.3  PLT 448* 458* 512*    Cardiac Enzymes: No results for input(s): CKTOTAL, CKMB, CKMBINDEX, TROPONINI in the last 168  hours.  BNP (last 3 results) No results for input(s): BNP in the last 8760 hours.  ProBNP (last 3 results) No results for input(s): PROBNP in the last 8760 hours.  Radiological Exams: Dg Chest Port 1 View  Result Date: 10/14/2018 CLINICAL DATA:  Respiratory failure EXAM: PORTABLE CHEST 1 VIEW COMPARISON:  10/05/2018 FINDINGS: Low volume chest with hazy opacity at the bases. Mild improvement at the left base where the diaphragm is now better visualized. Stable heart size that is likely normal for technique. Tracheostomy tube in place. IMPRESSION: Continued hazy opacity at the bases likely reflecting atelectasis. Mild improvement in aeration on the left. Electronically Signed   By: Monte Fantasia M.D.   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 Acute on chronic respiratory failure with hypoxia patient will continue aerosol trach collar 35% during the day and resting on the ventilator at night.  Patient will continue supportive measures and pulmonary toilet. 2. Acute transverse myelitis continue with supportive care 3. Healthcare associated pneumonia treated improved 4. Pulmonary embolism at baseline we will continue with present management   I have personally seen and evaluated the patient, evaluated laboratory and imaging results, formulated the  assessment and plan and placed orders. The Patient requires high complexity decision making for assessment and support.  Case was discussed on Rounds with the Respiratory Therapy Staff  Allyne Gee, MD [facility] Pulmonary Critical Care Medicine Sleep Medicine

## 2018-10-16 DIAGNOSIS — G373 Acute transverse myelitis in demyelinating disease of central nervous system: Secondary | ICD-10-CM | POA: Diagnosis not present

## 2018-10-16 DIAGNOSIS — I2699 Other pulmonary embolism without acute cor pulmonale: Secondary | ICD-10-CM | POA: Diagnosis not present

## 2018-10-16 DIAGNOSIS — J189 Pneumonia, unspecified organism: Secondary | ICD-10-CM | POA: Diagnosis not present

## 2018-10-16 DIAGNOSIS — J9621 Acute and chronic respiratory failure with hypoxia: Secondary | ICD-10-CM | POA: Diagnosis not present

## 2018-10-16 LAB — PROTIME-INR
INR: 1.3 — ABNORMAL HIGH (ref 0.8–1.2)
Prothrombin Time: 16.2 seconds — ABNORMAL HIGH (ref 11.4–15.2)

## 2018-10-16 NOTE — Progress Notes (Addendum)
Pulmonary Critical Care Medicine [place]   PULMONARY CRITICAL CARE SERVICE  PROGRESS NOTE  Date of Service: 10/16/2018  Benjamin Montoya  [id number]:[id number]  DOB: 07-Mar-1971   DOA: 08/27/2018  Referring Physician: Merton Border, MD  HPI: Benjamin Montoya is a 48 y.o. male seen for follow up of Acute on Chronic Respiratory Failure.  Patient continues on 35% aerosol trach collar satting well.   Medications: Reviewed on Rounds  Physical Exam:  Vitals: Pulse 71 respiration 14 BP 103/60 O2 sat 97% temp 97.8  Ventilator Settings AC 35%  . General: Comfortable at this time . Eyes: Grossly normal lids, irises & conjunctiva . ENT: grossly tongue is normal . Neck: no obvious mass . Cardiovascular: S1 S2 normal no gallop . Respiratory: No rales or rhonchi noted . Abdomen: soft . Skin: no rash seen on limited exam . Musculoskeletal: not rigid . Psychiatric:unable to assess . Neurologic: no seizure no involuntary movements         Lab Data:   Basic Metabolic Panel: Recent Labs  Lab 10/10/18 1033 10/11/18 0617 10/14/18 0406  NA 139 141 133*  K 3.3* 3.5 4.7  CL 98 101 91*  CO2 30 31 30   GLUCOSE 106* 113* 95  BUN 9 11 15   CREATININE 0.32* <0.30* 0.36*  CALCIUM 9.2 9.2 9.4    ABG: Recent Labs  Lab 10/13/18 1125  PHART 7.427  PCO2ART 52.0*  PO2ART 78.2*  HCO3 33.7*  O2SAT 95.5    Liver Function Tests: No results for input(s): AST, ALT, ALKPHOS, BILITOT, PROT, ALBUMIN in the last 168 hours. No results for input(s): LIPASE, AMYLASE in the last 168 hours. No results for input(s): AMMONIA in the last 168 hours.  CBC: Recent Labs  Lab 10/10/18 1033 10/11/18 0617 10/14/18 0406  WBC 10.5 9.6 14.5*  HGB 9.2* 9.0* 10.7*  HCT 31.4* 30.2* 36.1*  MCV 86.3 85.1 85.3  PLT 448* 458* 512*    Cardiac Enzymes: No results for input(s): CKTOTAL, CKMB, CKMBINDEX, TROPONINI in the last 168 hours.  BNP (last 3 results) No results for input(s): BNP in the last  8760 hours.  ProBNP (last 3 results) No results for input(s): PROBNP in the last 8760 hours.  Radiological Exams: No results found.  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 Acute on chronic respiratory failure with hypoxiapatient will continue aerosol trach collar 35% during the day and resting on the ventilator at night. Patient will continue supportive measures and pulmonary toilet. 2. Acute transverse myelitis continue with supportive care 3. Healthcare associated pneumonia treated improved 4. Pulmonary embolism at baseline we will continue with present management   I have personally seen and evaluated the patient, evaluated laboratory and imaging results, formulated the assessment and plan and placed orders. The Patient requires high complexity decision making for assessment and support.  Case was discussed on Rounds with the Respiratory Therapy Staff  Allyne Gee, MD [facility] Pulmonary Critical Care Medicine Sleep Medicine

## 2018-10-17 DIAGNOSIS — I2699 Other pulmonary embolism without acute cor pulmonale: Secondary | ICD-10-CM | POA: Diagnosis not present

## 2018-10-17 DIAGNOSIS — G373 Acute transverse myelitis in demyelinating disease of central nervous system: Secondary | ICD-10-CM | POA: Diagnosis not present

## 2018-10-17 DIAGNOSIS — J189 Pneumonia, unspecified organism: Secondary | ICD-10-CM | POA: Diagnosis not present

## 2018-10-17 DIAGNOSIS — J9621 Acute and chronic respiratory failure with hypoxia: Secondary | ICD-10-CM | POA: Diagnosis not present

## 2018-10-17 LAB — CBC
HCT: 33.9 % — ABNORMAL LOW (ref 39.0–52.0)
Hemoglobin: 10.2 g/dL — ABNORMAL LOW (ref 13.0–17.0)
MCH: 25.5 pg — ABNORMAL LOW (ref 26.0–34.0)
MCHC: 30.1 g/dL (ref 30.0–36.0)
MCV: 84.8 fL (ref 80.0–100.0)
Platelets: 389 10*3/uL (ref 150–400)
RBC: 4 MIL/uL — ABNORMAL LOW (ref 4.22–5.81)
RDW: 16.3 % — ABNORMAL HIGH (ref 11.5–15.5)
WBC: 9.5 10*3/uL (ref 4.0–10.5)
nRBC: 0 % (ref 0.0–0.2)

## 2018-10-17 LAB — BASIC METABOLIC PANEL
Anion gap: 14 (ref 5–15)
BUN: 22 mg/dL — ABNORMAL HIGH (ref 6–20)
CO2: 27 mmol/L (ref 22–32)
Calcium: 9.4 mg/dL (ref 8.9–10.3)
Chloride: 96 mmol/L — ABNORMAL LOW (ref 98–111)
Creatinine, Ser: 0.36 mg/dL — ABNORMAL LOW (ref 0.61–1.24)
GFR calc Af Amer: >60 mL/min (ref >60)
GFR calc non Af Amer: >60 mL/min (ref >60)
Glucose, Bld: 110 mg/dL — ABNORMAL HIGH (ref 70–99)
Potassium: 4.5 mmol/L (ref 3.5–5.1)
Sodium: 137 mmol/L (ref 135–145)

## 2018-10-17 LAB — CULTURE, RESPIRATORY W GRAM STAIN

## 2018-10-17 LAB — VANCOMYCIN, TROUGH: Vancomycin Tr: 15 ug/mL (ref 15–20)

## 2018-10-17 LAB — PROTIME-INR
INR: 1.3 — ABNORMAL HIGH (ref 0.8–1.2)
Prothrombin Time: 15.7 seconds — ABNORMAL HIGH (ref 11.4–15.2)

## 2018-10-17 NOTE — Progress Notes (Addendum)
Pulmonary Critical Care Medicine [place]   PULMONARY CRITICAL CARE SERVICE  PROGRESS NOTE  Date of Service: 10/17/2018  Benjamin Montoya  [id number]  DOB: 25-Jan-1971   DOA: 08/27/2018  Referring Physician: Merton Border, MD  HPI: Benjamin Montoya is a 48 y.o. male seen for follow up of Acute on Chronic Respiratory Failure.  Patient continues on 35% aerosol trach collar during the day satting well with no distress.  Medications: Reviewed on Rounds  Physical Exam:  Vitals: Pulse 65 respirations 19 P10 8/64 O2 sat 96 and temp 97.7  Ventilator Settings ATC 35%  . General: Comfortable at this time . Eyes: Grossly normal lids, irises & conjunctiva . ENT: grossly tongue is normal . Neck: no obvious mass . Cardiovascular: S1 S2 normal no gallop . Respiratory: No rales or rhonchi noted . Abdomen: soft . Skin: no rash seen on limited exam . Musculoskeletal: not rigid . Psychiatric:unable to assess . Neurologic: no seizure no involuntary movements         Lab Data:   Basic Metabolic Panel: Recent Labs  Lab 10/11/18 0617 10/14/18 0406 10/17/18 0813  NA 141 133* 137  K 3.5 4.7 4.5  CL 101 91* 96*  CO2 31 30 27   GLUCOSE 113* 95 110*  BUN 11 15 22*  CREATININE <0.30* 0.36* 0.36*  CALCIUM 9.2 9.4 9.4    ABG: Recent Labs  Lab 10/13/18 1125  PHART 7.427  PCO2ART 52.0*  PO2ART 78.2*  HCO3 33.7*  O2SAT 95.5    Liver Function Tests: No results for input(s): AST, ALT, ALKPHOS, BILITOT, PROT, ALBUMIN in the last 168 hours. No results for input(s): LIPASE, AMYLASE in the last 168 hours. No results for input(s): AMMONIA in the last 168 hours.  CBC: Recent Labs  Lab 10/11/18 0617 10/14/18 0406 10/17/18 0851  WBC 9.6 14.5* 9.5  HGB 9.0* 10.7* 10.2*  HCT 30.2* 36.1* 33.9*  MCV 85.1 85.3 84.8  PLT 458* 512* 389    Cardiac Enzymes: No results for input(s): CKTOTAL, CKMB, CKMBINDEX, TROPONINI in the last 168 hours.  BNP (last 3 results) No  results for input(s): BNP in the last 8760 hours.  ProBNP (last 3 results) No results for input(s): PROBNP in the last 8760 hours.  Radiological Exams: No results found.  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 Acute on chronic respiratory failure with hypoxiapatient will continue aerosol trach collar 35% during the day and resting on the ventilator at night. Patient will continue supportive measures and pulmonary toilet. 2. Acute transverse myelitis continue with supportive care 3. Healthcare associated pneumonia treated improved 4. Pulmonary embolism at baseline we will continue with present management   I have personally seen and evaluated the patient, evaluated laboratory and imaging results, formulated the assessment and plan and placed orders. The Patient requires high complexity decision making for assessment and support.  Case was discussed on Rounds with the Respiratory Therapy Staff  Allyne Gee, MD [facility] Pulmonary Critical Care Medicine Sleep Medicine

## 2018-10-18 DIAGNOSIS — J189 Pneumonia, unspecified organism: Secondary | ICD-10-CM | POA: Diagnosis not present

## 2018-10-18 DIAGNOSIS — J9621 Acute and chronic respiratory failure with hypoxia: Secondary | ICD-10-CM | POA: Diagnosis not present

## 2018-10-18 DIAGNOSIS — G373 Acute transverse myelitis in demyelinating disease of central nervous system: Secondary | ICD-10-CM | POA: Diagnosis not present

## 2018-10-18 DIAGNOSIS — I2699 Other pulmonary embolism without acute cor pulmonale: Secondary | ICD-10-CM | POA: Diagnosis not present

## 2018-10-18 LAB — PROTIME-INR
INR: 1.4 — ABNORMAL HIGH (ref 0.8–1.2)
Prothrombin Time: 17.3 seconds — ABNORMAL HIGH (ref 11.4–15.2)

## 2018-10-18 NOTE — Progress Notes (Signed)
Benjamin Montoya is a 48 year old male was sent to our facility on 08/27/2018 after a prolonged neuro ICU hospitalization for transverse myelitis and multiple complications at [facility].  He was hospitalized since 4/28 and treated with plasmapheresis, IVIG and steroids for his myelitis.  His stay was complicated by DVTs and PEs and eventually respiratory failure.  His high oxygenation requirements led to ECMO and he developed VAP with Acinetobacter and A. fib with RVR.  His work-up for infectious, neoplastic and autoimmune causes of transverse myelitis were negative.  His COVID test was negative and he was transferred later to our facility with a large sacral wound that continued to worsen.  The patient also had tracheostomy and a PEG placed prior to his arrival. During his stay the patient was treated for pneumonia/tracheobronchitis and multiple debridements for his sacral decubitus wound.  He continued to have rectal tube since admission due to the extent of his sacral decubitus ulcer and after discussions with our surgeon Dr. Noberto Retort we decided to send him for consult for colostomy.  The mother is basically in denial about his condition and we may need a neurology consult while he is in the emergency room to help [place] with clearing him for surgery.  Discussed the case with him and he is agreeable for his colostomy.  Physical examination  General appearance, Young male doing fairly well on ATC.  Alert awake oriented. HEENT no jaundice or pallor, no facial deviation Neck supple, tracheostomy in midline. Chest clear and resonant with decreased breath sounds at the bases Abdomen soft, bowel sounds present, PEG tube in place Extremities no clubbing cyanosis or edema Neuro alert awake oriented x3 with quadriplegia Skin unstageable decubitus ulcer noted  Reason for transfer: 1.Surgical consult for colostomy placement.                                   2.  Neurological consult for a second opinion about  prognosis of his                                        disease and his eligibility for colostomy(family request)  Assessment/plan 1.Recurrent pneumonia with Acinetobacter, continue with IV cefepime 2.  Respiratory failure; chronic, hypoxemic and hypercarbic, on ATC during the day and vent nightly.  Unfortunately this might be his baseline 3.  Sacral decubitus ulcer POA status post multiple debridements by Dr. Noberto Retort.  Consult for colostomy pending 4.  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  Bilateral pulmonary emboli thought to be secondary to IVIG, continue with Coumadin 6.  Paroxysmal atrial fib with rapid ventricular rate, status post amiodarone, stable rate at this time 7.  History of COPD, stable at this time with no wheezing 8.  Protein calorie malnutrition/dysphagia continue with tube feeds per PEG.  Medications: See med list.

## 2018-10-18 NOTE — Progress Notes (Signed)
Pulmonary Critical Care Medicine [facility]   PULMONARY CRITICAL CARE SERVICE  PROGRESS NOTE  Date of Service: 10/18/2018  Benjamin Montoya  AQT:[id number]  DOB: [birth year]   DOA: 08/27/2018  Referring Physician: Merton Border, MD  HPI: Benjamin Montoya is a 48 y.o. male seen for follow up of Acute on Chronic Respiratory Failure.  Patient currently is on T collar is on 35% FiO2 during the daytime and is resting on the ventilator at nighttime.  Medications: Reviewed on Rounds  Physical Exam:  Vitals: Temperature 96.6 pulse 74 respiratory 18 blood pressure 106/62 saturations 96%  Ventilator Settings patient is on T collar FiO2 35%  . General: Comfortable at this time . Eyes: Grossly normal lids, irises & conjunctiva . ENT: grossly tongue is normal . Neck: no obvious mass . Cardiovascular: S1 S2 normal no gallop . Respiratory: No rhonchi no rales are noted at this time . Abdomen: soft . Skin: no rash seen on limited exam . Musculoskeletal: not rigid . Psychiatric:unable to assess . Neurologic: no seizure no involuntary movements         Lab Data:   Basic Metabolic Panel: Recent Labs  Lab 10/14/18 0406 10/17/18 0813  NA 133* 137  K 4.7 4.5  CL 91* 96*  CO2 30 27  GLUCOSE 95 110*  BUN 15 22*  CREATININE 0.36* 0.36*  CALCIUM 9.4 9.4    ABG: Recent Labs  Lab 10/13/18 1125  PHART 7.427  PCO2ART 52.0*  PO2ART 78.2*  HCO3 33.7*  O2SAT 95.5    Liver Function Tests: No results for input(s): AST, ALT, ALKPHOS, BILITOT, PROT, ALBUMIN in the last 168 hours. No results for input(s): LIPASE, AMYLASE in the last 168 hours. No results for input(s): AMMONIA in the last 168 hours.  CBC: Recent Labs  Lab 10/14/18 0406 10/17/18 0851  WBC 14.5* 9.5  HGB 10.7* 10.2*  HCT 36.1* 33.9*  MCV 85.3 84.8  PLT 512* 389    Cardiac Enzymes: No results for input(s): CKTOTAL, CKMB, CKMBINDEX, TROPONINI in the last 168 hours.  BNP (last 3 results) No results  for input(s): BNP in the last 8760 hours.  ProBNP (last 3 results) No results for input(s): PROBNP in the last 8760 hours.  Radiological Exams: No results found.  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 Acute on chronic respiratory failure with hypoxia we will continue with the T collar wean FiO2 is 35% 2. Acute transverse myelitis patient is at baseline we will continue with supportive care 3. Healthcare associated pneumonia treated clinically improved 4. Pulmonary embolism treated we will continue with supportive care   I have personally seen and evaluated the patient, evaluated laboratory and imaging results, formulated the assessment and plan and placed orders. The Patient requires high complexity decision making for assessment and support.  Case was discussed on Rounds with the Respiratory Therapy Staff  Allyne Gee, MD [facility] Pulmonary Critical Care Medicine Sleep Medicine

## 2018-10-19 ENCOUNTER — Inpatient Hospital Stay
Admission: AD | Admit: 2018-10-19 | Discharge: 2018-10-21 | Disposition: A | Discharge status: Still In Hospital | Payer: 59 | Source: Other Acute Inpatient Hospital | Attending: Internal Medicine | Admitting: Internal Medicine

## 2018-10-19 ENCOUNTER — Encounter (HOSPITAL_COMMUNITY): Payer: Self-pay | Admitting: Emergency Medicine

## 2018-10-19 ENCOUNTER — Emergency Department (HOSPITAL_COMMUNITY)
Admission: EM | Admit: 2018-10-19 | Discharge: 2018-10-19 | Disposition: A | Discharge status: Home / Self Care | Payer: 59 | Attending: Emergency Medicine | Admitting: Emergency Medicine

## 2018-10-19 DIAGNOSIS — G373 Acute transverse myelitis in demyelinating disease of central nervous system: Secondary | ICD-10-CM | POA: Diagnosis not present

## 2018-10-19 DIAGNOSIS — I2699 Other pulmonary embolism without acute cor pulmonale: Secondary | ICD-10-CM | POA: Diagnosis not present

## 2018-10-19 DIAGNOSIS — Z01818 Encounter for other preprocedural examination: Secondary | ICD-10-CM | POA: Diagnosis present

## 2018-10-19 DIAGNOSIS — J189 Pneumonia, unspecified organism: Secondary | ICD-10-CM | POA: Diagnosis not present

## 2018-10-19 DIAGNOSIS — K624 Stenosis of anus and rectum: Secondary | ICD-10-CM | POA: Diagnosis not present

## 2018-10-19 DIAGNOSIS — J9621 Acute and chronic respiratory failure with hypoxia: Secondary | ICD-10-CM | POA: Diagnosis present

## 2018-10-19 DIAGNOSIS — K6289 Other specified diseases of anus and rectum: Secondary | ICD-10-CM

## 2018-10-19 LAB — PROTIME-INR
INR: 1.5 — ABNORMAL HIGH (ref 0.8–1.2)
INR: 1.5 — ABNORMAL HIGH (ref 0.8–1.2)
Prothrombin Time: 18.1 seconds — ABNORMAL HIGH (ref 11.4–15.2)
Prothrombin Time: 18.1 seconds — ABNORMAL HIGH (ref 11.4–15.2)

## 2018-10-19 LAB — CULTURE, BLOOD (ROUTINE X 2)
Culture: NO GROWTH
Culture: NO GROWTH
Special Requests: ADEQUATE
Special Requests: ADEQUATE

## 2018-10-19 LAB — CBC
HCT: 36.3 % — ABNORMAL LOW (ref 39.0–52.0)
Hemoglobin: 10.8 g/dL — ABNORMAL LOW (ref 13.0–17.0)
MCH: 25.4 pg — ABNORMAL LOW (ref 26.0–34.0)
MCHC: 29.8 g/dL — ABNORMAL LOW (ref 30.0–36.0)
MCV: 85.2 fL (ref 80.0–100.0)
Platelets: 398 10*3/uL (ref 150–400)
RBC: 4.26 MIL/uL (ref 4.22–5.81)
RDW: 16.5 % — ABNORMAL HIGH (ref 11.5–15.5)
WBC: 15.4 10*3/uL — ABNORMAL HIGH (ref 4.0–10.5)
nRBC: 0 % (ref 0.0–0.2)

## 2018-10-19 LAB — APTT: aPTT: 31 seconds (ref 24–36)

## 2018-10-19 NOTE — ED Triage Notes (Signed)
Pt sent from [facility] for surgical consult for colostomy.

## 2018-10-19 NOTE — Consult Note (Signed)
NEURO HOSPITALIST CONSULT NOTE   Requestig physician: Dr. Lockie Molauratolo  Reason for Consult: Chronic sequelae of prior transverse myelitis  History obtained from:   Patient and Chart     HPI:                                                                                                                                          Benjamin Montoya is an 48 y.o. male with history of transverse myelitis and tetraplegia, severe weakness of bilateral upper extremities and complete plegia of bilateral lower extremities who is currently at [facility]. He was sent to the ED to be evaluated by Surgery for colostomy for chronic flaccid anal sphincter tone. Mother requested a Neurology consult to assess for prognosis regarding recovery of his limb and sphincter function.   Past Medical History:  Diagnosis Date  . Acute on chronic respiratory failure with hypoxia (HCC)   . 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 Healthcare-associated pneumonia   . Pulmonary embolism [facility](HCC)     Past Surgical History:  Procedure Laterality Date  . IR GASTROSTOMY TUBE MOD SED  09/13/2018  . TRACHEOSTOMY      No family history on file.            Social History:  has no history on file for tobacco, alcohol, and drug.  Not on File  MEDICATIONS:                                                                                                                     Med list from [facility] unavailable in Epic   ROS:  Did not endorse any complaints.    Blood pressure 115/76, pulse (!) 103, temperature 99.1 F (37.3 C), temperature source Axillary, resp. rate 12, SpO2 97 %.   General Examination:                                                                                                       Physical Exam  HEENT-  [place]/AT    Lungs- Has tracheostomy. Respirations unlabored.  Extremities- Warm and well perfused. Drop foot bilaterally.   Neurological Examination Mental Status: Awake and alert. Mouths words in order to communicate. In this context speech is fluent with intact comprehension  Able to follow all commands without difficulty. Cranial Nerves: II: Visual fields with no extinction to DSS. PERRL.  III,IV, VI: No ptosis. EOMI with saccadic pursuits noted.  V,VII: Smile symmetric. Facial temp sensation equal bilaterally.  VIII: hearing intact to voice IX,X: Palate rises symmetrically XI: Shoulder shrug is weak bilaterally, worse on the left XII: midline tongue extension Motor: LUE: 2/5 deltoid, biceps, triceps. 3/5 wrist flexion and extension. Able to wiggle fingers weakly with decreased ROM RUE: 3/5 deltoid with decreased volitional ROM, 3/5 biceps and triceps, 4-/5 wrist flexion/extension, 3/5 grip. Able to wiggle fingers more rapidly than on the left.  BLE: Flaccid tone with 0/5 strength proximally and distally  Sensory: RUE temp sensation decreased. LUE temp sensation intact. FT intact bilateral upper extremities. RLE with no sensation to FT, temp or pressure. LLE with some sensation to FT, temp and pressure.  Deep Tendon Reflexes: Low amplitude 4+ bilateral brachioradialis and biceps. 0 patellae and achilles bilaterally  Plantars: Mute bilaterally  Cerebellar: Slow FNF bilaterally without ataxia. Unable to test H-S.  Gait: Unable to assess   Lab Results: Basic Metabolic Panel: Recent Labs  Lab 10/14/18 0406 10/17/18 0813  NA 133* 137  K 4.7 4.5  CL 91* 96*  CO2 30 27  GLUCOSE 95 110*  BUN 15 22*  CREATININE 0.36* 0.36*  CALCIUM 9.4 9.4    CBC: Recent Labs  Lab 10/14/18 0406 10/17/18 0851  WBC 14.5* 9.5  HGB 10.7* 10.2*  HCT 36.1* 33.9*  MCV 85.3 84.8  PLT 512* 389    Cardiac Enzymes: No results for input(s): CKTOTAL, CKMB, CKMBINDEX, TROPONINI in the last 168 hours.  Lipid  Panel: No results for input(s): CHOL, TRIG, HDL, CHOLHDL, VLDL, LDLCALC in the last 168 hours.  Imaging: No results found.   Assessment: 48 year old male with chronic neurological sequelae of transverse myelitis.  1. Exam localizes to the cervical and upper thoracic spinal cord with deficits being worse in the lower extremities due to longitudinally extensive transverse myelitis lesion seen on MRI which is documented in Care Everywhere.  2. Overall exam findings are most consistent with chronic sequelae of the spinal cord lesion. No acute findings on exam.  3. Chances for meaningful recovery of sphincter function are poor.   Recommendations: 1. Agree that colostomy would be the best option. The patient has expressed an  Interest.  2. Upper extremity function may gradually improve over time with  intensive OT.  3. Unlikely to have any meaningful recovery of BLE motor function.    Electronically signed: Dr. Kerney Elbe 10/19/2018, 2:23 PM

## 2018-10-19 NOTE — Consult Note (Signed)
[facility]  Benjamin MilletDavid Perham 1970/04/01  [id number].    Requesting MD: Dr. Sharyon MedicusHijazi Chief Complaint/Reason for Consult: Diverting colostomy for sacral wound   Patient is able to communicate through mouthing of words. He can shake his head yes and no and has some right arm movement. History is obtained by patient and chart review.   HPI: Patient is a 48 year old male who [facility]. He was admitted there on 07/20/18 with transverse myelitis and treated with plasmapheresis, IVIG and steroids. Neurology workup for infectious, neoplastic and autoimmune causes of transverse myelitis were negative. Hospitalization was complicated by DVT/PE and respiratory failure. He is currently on Coumadin for DVT/PE's with most recent INR 1.5. He eventually required ECMO. His course was also complicated by Ventilatory associated Acinetobacter pneumonia which he was treated with antibiotics for. Per notes he also developed A. Fib with RVR. Patient underwent trach and PEG prior to placement at [facility]. He had a large sacral decubitus wound on admission to [facility]. Has had multiple debridements of wound by Dr. Georgiana ShoreLininger. He stool is currently being diverted by a rectal tube. Per Dr. Elyn AquasHijazi's note, "he continued to have rectal tube since admission due to the extent of his sacral decubitus ulcer and after discussions with our surgeon Dr. Georgiana ShoreLininger we decided to send him for consult for colostomy". He presents in the ED today for consultation. Neurology also to see per their note for a second opinion of his disease prognosis. Patient's mother present at bedside. Neuro seen while I was in the room. From my understanding, he does not believe the patient will regain rectal tone and could benefit from a colostomy.   ROS: Review of Systems  Unable to perform ROS: Patient nonverbal  Constitutional: Negative for chills and fever.   Gastrointestinal: Negative for abdominal pain.   All systems reviewed and otherwise negative except for as above  No family history on file.  Past Medical History:  Diagnosis Date  . Acute on chronic respiratory failure with hypoxia (HCC)   . 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 Healthcare-associated pneumonia   . Pulmonary embolism [facility](HCC)     Past Surgical History:  Procedure Laterality Date  . IR GASTROSTOMY TUBE MOD SED  09/13/2018  . TRACHEOSTOMY      Social History:  has no history on file for tobacco, alcohol, and drug.  Allergies: Not on File  (Not in a hospital admission)   Prior to Admission medications   Not on File   FiO2 (%):  [50 %] 50 %  Blood pressure 115/76, pulse (!) 103, temperature 99.1 F (37.3 C), temperature source Axillary, resp. rate 12, SpO2 97 %. Physical Exam: General: ill appearing, obese male lying in bed  HEENT: head is normocephalic, atraumatic.  Sclera are noninjected.  Pupils equal and round.  Ears and nose without any masses or lesions.  Mouth is pink and moist. Dentition fair.  Neck: Trach collar  Heart: Tachycardic.  Lungs: Distant breath sounds throughout. Rales at bases. Tachypneic  Abd: Protuberant, soft, NT/ND, +BS, no masses, hernias, or organomegaly MS: Some movement of the right arm. No movement of the lower extremities. B/l LE edema  GU: Chaperone present - Foley present. Rectal tube present. Sacral wound with wound vac in place.  Skin: Warm and dry. No rash.  Psych: Alert to name, place, situation and time.  Neuro: I  was present during Dr. Yvetta Coder exam. Some movement of right arm. No movement of lower extremities. Flacid reflexes LE's. Hyper-reflexive UE's.   Results for orders placed or performed during the hospital encounter of 08/27/18 (from the past 48 hour(s))  Protime-INR     Status: Abnormal   Collection Time: 10/18/18  6:52 AM  Result Value Ref Range    Prothrombin Time 17.3 (H) 11.4 - 15.2 seconds   INR 1.4 (H) 0.8 - 1.2    Comment: (NOTE) INR goal varies based on device and disease states. Performed at [facility], [place] [address], [place], [place] [address]   Protime-INR     Status: Abnormal   Collection Time: 10/19/18  7:48 AM  Result Value Ref Range   Prothrombin Time 18.1 (H) 11.4 - 15.2 seconds   INR 1.5 (H) 0.8 - 1.2    Comment: (NOTE) INR goal varies based on device and disease states. Performed at [facility], [place] [address] [place], [place] [address]    No results found.    Assessment/Plan This is a 48 year who we were consulted for possible colostomy creation.  He has had a complicated course as above.  Per neurology note chances of meaningful recovery of sphincter tone is poor.  Recommend end colostomy. Risks and benefits discussed with patient and mother are in agreement with this. Will tentatively plan to do this on Thursday, 10/21/2018. Will check pre-albumin. Dr. Redmond Pulling discussed with Dr. Laren Everts who will convert the patient from Coumadin to IV Heparin. [facility].   Jillyn Ledger, [facility] Surgery 10/19/2018, 2:24 PM Pager: [phone]

## 2018-10-19 NOTE — Progress Notes (Signed)
Pulmonary Critical Care Medicine [place]   PULMONARY CRITICAL CARE SERVICE  PROGRESS NOTE  Date of Service: 10/19/2018  Benjamin Montoya  GUR:[id number]  DOB: Oct 31, 1970   DOA: 10/19/2018  Referring Physician: Merton Border, MD  HPI: Benjamin Montoya is a 48 y.o. male seen for follow up of Acute on Chronic Respiratory Failure.  Patient is on full support this morning.  Should be able to do T collar again  Medications: Reviewed on Rounds  Physical Exam:  Vitals: Temperature 97.5 pulse 75 respiratory 18 blood pressure 107/69 saturations 95%  Ventilator Settings mode ventilation assist control FiO2 45% tidal volume 540 PEEP 5  . General: Comfortable at this time . Eyes: Grossly normal lids, irises & conjunctiva . ENT: grossly tongue is normal . Neck: no obvious mass . Cardiovascular: S1 S2 normal no gallop . Respiratory: No rhonchi no rales are noted at this time . Abdomen: soft . Skin: no rash seen on limited exam . Musculoskeletal: not rigid . Psychiatric:unable to assess . Neurologic: no seizure no involuntary movements         Lab Data:   Basic Metabolic Panel: Recent Labs  Lab 10/14/18 0406 10/17/18 0813  NA 133* 137  K 4.7 4.5  CL 91* 96*  CO2 30 27  GLUCOSE 95 110*  BUN 15 22*  CREATININE 0.36* 0.36*  CALCIUM 9.4 9.4    ABG: Recent Labs  Lab 10/13/18 1125  PHART 7.427  PCO2ART 52.0*  PO2ART 78.2*  HCO3 33.7*  O2SAT 95.5    Liver Function Tests: No results for input(s): AST, ALT, ALKPHOS, BILITOT, PROT, ALBUMIN in the last 168 hours. No results for input(s): LIPASE, AMYLASE in the last 168 hours. No results for input(s): AMMONIA in the last 168 hours.  CBC: Recent Labs  Lab 10/14/18 0406 10/17/18 0851 10/19/18 1945  WBC 14.5* 9.5 15.4*  HGB 10.7* 10.2* 10.8*  HCT 36.1* 33.9* 36.3*  MCV 85.3 84.8 85.2  PLT 512* 389 398    Cardiac Enzymes: No results for input(s): CKTOTAL, CKMB, CKMBINDEX, TROPONINI in the last 168  hours.  BNP (last 3 results) No results for input(s): BNP in the last 8760 hours.  ProBNP (last 3 results) No results for input(s): PROBNP in the last 8760 hours.  Radiological Exams: No results found.  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 Acute on chronic respiratory failure with hypoxia we will continue with full vent support patient scheduled for possible procedure will assess for T collar afterwards 2. Acute transverse myelitis grossly unchanged continue with present management. 3. Healthcare associated pneumonia treated we will follow along 4. Pulmonary embolism at baseline we will continue with present management   I have personally seen and evaluated the patient, evaluated laboratory and imaging results, formulated the assessment and plan and placed orders. The Patient requires high complexity decision making for assessment and support.  Case was discussed on Rounds with the Respiratory Therapy Staff  Allyne Gee, MD [facility] Pulmonary Critical Care Medicine Sleep Medicine

## 2018-10-19 NOTE — ED Provider Notes (Signed)
[facility]   CSN: [id number] Arrival date & time: 10/19/18  1302    History   Chief Complaint No chief complaint on file.   HPI Benjamin Montoya is a 48 y.o. male.     Patient here for surgery and neurology consultation.  Has a history of acute transverse myelitis and is now status post trach and PEG.  He is here for second opinion from neurology and surgery consultation for ostomy.  Caregiver has no other concerns.  Patient is able to shake his head yes and no to answers and does not complain of any shortness of breath, chest pain, abdominal pain.  The history is provided by the patient and a caregiver.  Illness Location:  General Severity:  Mild Onset quality:  Gradual Timing:  Constant Progression:  Unchanged Chronicity:  New Associated symptoms: no abdominal pain, no chest pain, no fever and no shortness of breath     Past Medical History:  Diagnosis Date  . Acute on chronic respiratory failure with hypoxia (HCC)   . 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 Healthcare-associated pneumonia   . Pulmonary embolism [facility](HCC)     Patient Active Problem List   Diagnosis Date Noted  . Acute on chronic respiratory failure with hypoxia (HCC)   . 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 Healthcare-associated pneumonia   . Pulmonary embolism [facility](HCC)     Past Surgical History:  Procedure Laterality Date  . IR GASTROSTOMY TUBE MOD SED  09/13/2018  . TRACHEOSTOMY          Home Medications    Prior to Admission medications   Not on File    Family History No family history on file.  Social History Social History   Tobacco Use  . Smoking status: Not on file  Substance Use Topics  . Alcohol use: Not on file  . Drug use: Not on file     Allergies   Patient has no known allergies.   Review of Systems Review of Systems   Constitutional: Negative for fever.  Respiratory: Negative for shortness of breath.   Cardiovascular: Negative for chest pain.  Gastrointestinal: Negative for abdominal pain.  Neurological: Positive for weakness and numbness.     Physical Exam Updated Vital Signs BP 115/76 (BP Location: Right Arm)   Pulse (!) 103   Temp 99.1 F (37.3 C) (Axillary)   Resp 12   SpO2 97%   Physical Exam Vitals signs and nursing note reviewed.  Constitutional:      General: He is not in acute distress.    Appearance: He is well-developed. He is not ill-appearing.  HENT:     Head: Normocephalic and atraumatic.  Eyes:     Extraocular Movements: Extraocular movements intact.     Conjunctiva/sclera: Conjunctivae normal.     Pupils: Pupils are equal, round, and reactive to light.  Neck:     Musculoskeletal: Neck supple.  Cardiovascular:     Rate and Rhythm: Normal rate and regular rhythm.     Heart sounds: No murmur.  Pulmonary:     Effort: Pulmonary effort is normal. No respiratory distress.     Breath sounds: Normal breath sounds.  Abdominal:     Palpations: Abdomen is soft.     Tenderness: There is no abdominal tenderness.  Skin:    General: Skin is warm and dry.  Neurological:  Mental Status: He is alert.     Comments: Patient is alert, answers yes/no questions by shaking his head, no movement in his lower extremities, partial movement of the right upper extremity, follows commands, decrease sensation in his legs      ED Treatments / Results  Labs (all labs ordered are listed, but only abnormal results are displayed) Labs Reviewed - No data to display  EKG EKG Interpretation  Date/Time:  Tuesday October 19 2018 13:08:47 EDT Ventricular Rate:  107 PR Interval:    QRS Duration: 110 QT Interval:  315 QTC Calculation: 421 R Axis:   59 Text Interpretation:  Sinus tachycardia Ventricular trigeminy Low voltage, precordial leads RSR' in V1 or V2, right VCD or RVH Confirmed by Lennice Sites [phone]) on 10/19/2018 1:19:15 PM   Radiology No results found.  Procedures Procedures (including critical care time)  Medications Ordered in ED Medications - No data to display   Initial Impression / Assessment and Plan / ED Course  I have reviewed the triage vital signs and the nursing notes.  Pertinent labs & imaging results that were available during my care of the patient were reviewed by me and considered in my medical decision making (see chart for details).     Benjamin Montoya is a 48 year old male with history of transverse myelitis now status post tracheostomy and PEG who was sent to the ED from his LTAC for surgical and neurology consultation.  Patient with normal vitals.  No fever.  Currently is inpatient in the hospital and long-term care facility and due to arrangements patient needed to come to the ED to have surgery evaluate him for diverting ostomy as he now has loss of rectal tone from his transverse myelitis.  Family wanted second recommendation from neurology and Dr. Cheral Marker came to evaluate the patient.  Dr. Cheral Marker states that patient likely has chronic weakness now and would benefit from an ostomy.  Dr. Redmond Pulling with general surgery saw the patient as well and they will set up outpatient surgery as this will help benefit the patient's life at this time.  Patient was overall hemodynamically stable throughout my care.  No other acute issues.  Discharged back to his LTAC.  This chart was dictated using voice recognition software.  Despite best efforts to proofread,  errors can occur which can change the documentation meaning.    Final Clinical Impressions(s) / ED Diagnoses   Final diagnoses:  Decreased rectal sphincter tone    ED Discharge Orders    None       Lennice Sites, DO 10/19/18 1540

## 2018-10-19 NOTE — Discharge Instructions (Addendum)
Surgery will schedule you for ostomy.

## 2018-10-19 NOTE — ED Notes (Signed)
Patient Alert and oriented to baseline. Stable and ambulatory to baseline. Patient verbalized understanding of the discharge instructions.  Patient belongings were taken by the patient.   

## 2018-10-20 DIAGNOSIS — J189 Pneumonia, unspecified organism: Secondary | ICD-10-CM | POA: Diagnosis not present

## 2018-10-20 DIAGNOSIS — J9621 Acute and chronic respiratory failure with hypoxia: Secondary | ICD-10-CM | POA: Diagnosis not present

## 2018-10-20 DIAGNOSIS — G373 Acute transverse myelitis in demyelinating disease of central nervous system: Secondary | ICD-10-CM | POA: Diagnosis not present

## 2018-10-20 DIAGNOSIS — I2699 Other pulmonary embolism without acute cor pulmonale: Secondary | ICD-10-CM | POA: Diagnosis not present

## 2018-10-20 LAB — BASIC METABOLIC PANEL
Anion gap: 8 (ref 5–15)
BUN: 22 mg/dL — ABNORMAL HIGH (ref 6–20)
CO2: 31 mmol/L (ref 22–32)
Calcium: 9.6 mg/dL (ref 8.9–10.3)
Chloride: 98 mmol/L (ref 98–111)
Creatinine, Ser: 0.33 mg/dL — ABNORMAL LOW (ref 0.61–1.24)
GFR calc Af Amer: >60 mL/min (ref >60)
GFR calc non Af Amer: >60 mL/min (ref >60)
Glucose, Bld: 106 mg/dL — ABNORMAL HIGH (ref 70–99)
Potassium: 3.7 mmol/L (ref 3.5–5.1)
Sodium: 137 mmol/L (ref 135–145)

## 2018-10-20 LAB — CBC
HCT: 33.6 % — ABNORMAL LOW (ref 39.0–52.0)
Hemoglobin: 9.9 g/dL — ABNORMAL LOW (ref 13.0–17.0)
MCH: 25.2 pg — ABNORMAL LOW (ref 26.0–34.0)
MCHC: 29.5 g/dL — ABNORMAL LOW (ref 30.0–36.0)
MCV: 85.5 fL (ref 80.0–100.0)
Platelets: 372 10*3/uL (ref 150–400)
RBC: 3.93 MIL/uL — ABNORMAL LOW (ref 4.22–5.81)
RDW: 16.4 % — ABNORMAL HIGH (ref 11.5–15.5)
WBC: 13.6 10*3/uL — ABNORMAL HIGH (ref 4.0–10.5)
nRBC: 0 % (ref 0.0–0.2)

## 2018-10-20 LAB — HEPARIN LEVEL (UNFRACTIONATED)
Heparin Unfractionated: 0.13 IU/mL — ABNORMAL LOW (ref 0.30–0.70)
Heparin Unfractionated: 0.25 IU/mL — ABNORMAL LOW (ref 0.30–0.70)
Heparin Unfractionated: 0.29 IU/mL — ABNORMAL LOW (ref 0.30–0.70)

## 2018-10-20 LAB — PROTIME-INR
INR: 1.6 — ABNORMAL HIGH (ref 0.8–1.2)
Prothrombin Time: 18.6 seconds — ABNORMAL HIGH (ref 11.4–15.2)

## 2018-10-20 LAB — SARS CORONAVIRUS 2 BY RT PCR (HOSPITAL ORDER, PERFORMED IN ~~LOC~~ HOSPITAL LAB): SARS Coronavirus 2: NEGATIVE

## 2018-10-20 LAB — PREALBUMIN: Prealbumin: 23.9 mg/dL (ref 18–38)

## 2018-10-20 MED ORDER — POLYETHYLENE GLYCOL 3350 17 GM/SCOOP PO POWD: 1.0000 | Freq: Once | ORAL | Status: DC

## 2018-10-20 NOTE — Progress Notes (Signed)
Will plan for surgery tomorrow with Dr. Wilson for creation of End Colostomy.   Patients tube feeds need to be stopped. He needs to be made NPO. He will need some gentle IVF. He will need to have Miralax bowel prep.   Patient's heparin needs to be held after 3am.   He will need lab work in the AM. CBC, BMP, Type and Screen.   I have discussed this with Dr. Brown who is caring for the patient in [facility].   I have also discussed with [facility] nurse for marking of ostomy site in pre-op area prior to surgery.   All orders above have been placed in Epic. I have also placed orders for informed consent and abx to be on call to the OR at the time of surgery.  

## 2018-10-20 NOTE — Progress Notes (Signed)
Pulmonary Critical Care Medicine [place]   PULMONARY CRITICAL CARE SERVICE  PROGRESS NOTE  Date of Service: 10/20/2018  Shriyan Arakawa  GMW:[id number]  DOB: November 14, 1970   DOA: 10/19/2018  Referring Physician: Merton Border, MD  HPI: Auryn Paige is a 48 y.o. male seen for follow up of Acute on Chronic Respiratory Failure.  Patient currently is on T collar has been on 40% FiO2 using the PMV  Medications: Reviewed on Rounds  Physical Exam:  Vitals: Temperature 97.8 pulse 57 respiratory 18 blood pressure 111/60 saturations 94%  Ventilator Settings off ventilator on T collar  . General: Comfortable at this time . Eyes: Grossly normal lids, irises & conjunctiva . ENT: grossly tongue is normal . Neck: no obvious mass . Cardiovascular: S1 S2 normal no gallop . Respiratory: No rhonchi no rales are noted at this time . Abdomen: soft . Skin: no rash seen on limited exam . Musculoskeletal: not rigid . Psychiatric:unable to assess . Neurologic: no seizure no involuntary movements         Lab Data:   Basic Metabolic Panel: Recent Labs  Lab 10/14/18 0406 10/17/18 0813  NA 133* 137  K 4.7 4.5  CL 91* 96*  CO2 30 27  GLUCOSE 95 110*  BUN 15 22*  CREATININE 0.36* 0.36*  CALCIUM 9.4 9.4    ABG: Recent Labs  Lab 10/13/18 1125  PHART 7.427  PCO2ART 52.0*  PO2ART 78.2*  HCO3 33.7*  O2SAT 95.5    Liver Function Tests: No results for input(s): AST, ALT, ALKPHOS, BILITOT, PROT, ALBUMIN in the last 168 hours. No results for input(s): LIPASE, AMYLASE in the last 168 hours. No results for input(s): AMMONIA in the last 168 hours.  CBC: Recent Labs  Lab 10/14/18 0406 10/17/18 0851 10/19/18 1945  WBC 14.5* 9.5 15.4*  HGB 10.7* 10.2* 10.8*  HCT 36.1* 33.9* 36.3*  MCV 85.3 84.8 85.2  PLT 512* 389 398    Cardiac Enzymes: No results for input(s): CKTOTAL, CKMB, CKMBINDEX, TROPONINI in the last 168 hours.  BNP (last 3 results) No results for  input(s): BNP in the last 8760 hours.  ProBNP (last 3 results) No results for input(s): PROBNP in the last 8760 hours.  Radiological Exams: No results found.  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 Acute on chronic respiratory failure with hypoxia we will continue with T collar trials titrate oxygen continue pulmonary toilet.  Continue using the PMV 2. Acute transverse myelitis at baseline continue supportive care 3. Healthcare associated pneumonia treated 4. Pulmonary embolism at baseline we will continue to monitor   I have personally seen and evaluated the patient, evaluated laboratory and imaging results, formulated the assessment and plan and placed orders. The Patient requires high complexity decision making for assessment and support.  Case was discussed on Rounds with the Respiratory Therapy Staff  Allyne Gee, MD [facility] Pulmonary Critical Care Medicine Sleep Medicine

## 2018-10-20 NOTE — H&P (View-Only) (Signed)
Will plan for surgery tomorrow with Dr. Redmond Pulling for creation of End Colostomy.   Patients tube feeds need to be stopped. He needs to be made NPO. He will need some gentle IVF. He will need to have Miralax bowel prep.   Patient's heparin needs to be held after 3am.   He will need lab work in the AM. CBC, BMP, Type and Screen.   I have discussed this with Dr. Owens Shark who is caring for the patient in [facility].   I have also discussed with [place] nurse for marking of ostomy site in pre-op area prior to surgery.   All orders above have been placed in Epic. I have also placed orders for informed consent and abx to be on call to the OR at the time of surgery.

## 2018-10-21 ENCOUNTER — Encounter
Admission: AD | Disposition: A | Discharge status: Skilled Nursing Facility | Payer: Self-pay | Source: Ambulatory Visit | Attending: Internal Medicine

## 2018-10-21 ENCOUNTER — Inpatient Hospital Stay (HOSPITAL_COMMUNITY): Payer: 59 | Admitting: Certified Registered Nurse Anesthetist

## 2018-10-21 ENCOUNTER — Inpatient Hospital Stay: Admit: 2018-10-21 | Payer: Self-pay | Admitting: General Surgery

## 2018-10-21 ENCOUNTER — Inpatient Hospital Stay (HOSPITAL_COMMUNITY)
Admission: AD | Admit: 2018-10-21 | Discharge: 2018-12-09 | Disposition: A | Discharge status: Skilled Nursing Facility | Payer: 59 | Source: Ambulatory Visit | Attending: Internal Medicine | Admitting: Internal Medicine

## 2018-10-21 ENCOUNTER — Encounter: Admission: AD | Disposition: A | Discharge status: Still In Hospital | Payer: Self-pay | Attending: Internal Medicine

## 2018-10-21 ENCOUNTER — Encounter: Payer: Self-pay | Admitting: Certified Registered Nurse Anesthetist

## 2018-10-21 ENCOUNTER — Inpatient Hospital Stay
Admission: RE | Admit: 2018-10-21 | Payer: Self-pay | Source: Other Acute Inpatient Hospital | Admitting: Internal Medicine

## 2018-10-21 DIAGNOSIS — I2699 Other pulmonary embolism without acute cor pulmonale: Secondary | ICD-10-CM | POA: Diagnosis present

## 2018-10-21 DIAGNOSIS — J189 Pneumonia, unspecified organism: Secondary | ICD-10-CM | POA: Diagnosis present

## 2018-10-21 DIAGNOSIS — R52 Pain, unspecified: Secondary | ICD-10-CM

## 2018-10-21 DIAGNOSIS — J9621 Acute and chronic respiratory failure with hypoxia: Secondary | ICD-10-CM | POA: Diagnosis present

## 2018-10-21 DIAGNOSIS — J9600 Acute respiratory failure, unspecified whether with hypoxia or hypercapnia: Secondary | ICD-10-CM

## 2018-10-21 DIAGNOSIS — G373 Acute transverse myelitis in demyelinating disease of central nervous system: Secondary | ICD-10-CM | POA: Diagnosis present

## 2018-10-21 HISTORY — PX: UMBILICAL HERNIA REPAIR: SHX196

## 2018-10-21 HISTORY — PX: LAPAROSCOPIC SIGMOID COLECTOMY: SHX5928

## 2018-10-21 LAB — CBC
HCT: 32.3 % — ABNORMAL LOW (ref 39.0–52.0)
Hemoglobin: 9.7 g/dL — ABNORMAL LOW (ref 13.0–17.0)
MCH: 25.2 pg — ABNORMAL LOW (ref 26.0–34.0)
MCHC: 30 g/dL (ref 30.0–36.0)
MCV: 83.9 fL (ref 80.0–100.0)
Platelets: 358 10*3/uL (ref 150–400)
RBC: 3.85 MIL/uL — ABNORMAL LOW (ref 4.22–5.81)
RDW: 16.5 % — ABNORMAL HIGH (ref 11.5–15.5)
WBC: 12.7 10*3/uL — ABNORMAL HIGH (ref 4.0–10.5)
nRBC: 0 % (ref 0.0–0.2)

## 2018-10-21 LAB — BASIC METABOLIC PANEL
Anion gap: 12 (ref 5–15)
BUN: 17 mg/dL (ref 6–20)
CO2: 27 mmol/L (ref 22–32)
Calcium: 9.7 mg/dL (ref 8.9–10.3)
Chloride: 96 mmol/L — ABNORMAL LOW (ref 98–111)
Creatinine, Ser: 0.33 mg/dL — ABNORMAL LOW (ref 0.61–1.24)
Glucose, Bld: 82 mg/dL (ref 70–99)
Potassium: 3.7 mmol/L (ref 3.5–5.1)
Sodium: 135 mmol/L (ref 135–145)

## 2018-10-21 LAB — ABO/RH: ABO/RH(D): A NEG

## 2018-10-21 LAB — TYPE AND SCREEN
ABO/RH(D): A NEG
Antibody Screen: NEGATIVE

## 2018-10-21 SURGERY — COLECTOMY, SIGMOID, LAPAROSCOPIC
Anesthesia: General

## 2018-10-21 SURGERY — COLECTOMY, SIGMOID, LAPAROSCOPIC
Anesthesia: General | Site: Abdomen

## 2018-10-21 SURGERY — CREATION, COLOSTOMY, LOOP, LAPAROSCOPIC
Anesthesia: General

## 2018-10-21 MED ORDER — LIDOCAINE 2% (20 MG/ML) 5 ML SYRINGE
INTRAMUSCULAR | Status: AC
  Filled 2018-10-21: qty 5

## 2018-10-21 MED ORDER — ROCURONIUM BROMIDE 10 MG/ML (PF) SYRINGE
PREFILLED_SYRINGE | INTRAVENOUS | Status: DC | PRN
  Administered 2018-10-21: 20 mg via INTRAVENOUS
  Administered 2018-10-21: 40 mg via INTRAVENOUS
  Administered 2018-10-21 (×2): 20 mg via INTRAVENOUS

## 2018-10-21 MED ORDER — BUPIVACAINE-EPINEPHRINE 0.5% -1:200000 IJ SOLN
INTRAMUSCULAR | Status: DC | PRN
  Administered 2018-10-21: 2 mL

## 2018-10-21 MED ORDER — HEPARIN SODIUM (PORCINE) 5000 UNIT/ML IJ SOLN
INTRAMUSCULAR | Status: AC
  Administered 2018-10-21: 09:00:00 5000 [IU]
  Filled 2018-10-21: qty 1

## 2018-10-21 MED ORDER — DEXAMETHASONE SODIUM PHOSPHATE 10 MG/ML IJ SOLN
INTRAMUSCULAR | Status: AC
  Filled 2018-10-21: qty 1

## 2018-10-21 MED ORDER — ONDANSETRON HCL 4 MG/2ML IJ SOLN
INTRAMUSCULAR | Status: DC | PRN
  Administered 2018-10-21: 4 mg via INTRAVENOUS

## 2018-10-21 MED ORDER — MIDAZOLAM HCL 5 MG/5ML IJ SOLN
INTRAMUSCULAR | Status: DC | PRN
  Administered 2018-10-21: 2 mg via INTRAVENOUS

## 2018-10-21 MED ORDER — PROPOFOL 10 MG/ML IV BOLUS
INTRAVENOUS | Status: AC
  Filled 2018-10-21: qty 20

## 2018-10-21 MED ORDER — HEPARIN SODIUM (PORCINE) 5000 UNIT/ML IJ SOLN: 5000.0000 [IU] | Freq: Once | INTRAMUSCULAR | Status: DC

## 2018-10-21 MED ORDER — SODIUM CHLORIDE 0.9 % IV SOLN
INTRAVENOUS | Status: AC
  Filled 2018-10-21: qty 2

## 2018-10-21 MED ORDER — FENTANYL CITRATE (PF) 250 MCG/5ML IJ SOLN
INTRAMUSCULAR | Status: AC
  Filled 2018-10-21: qty 5

## 2018-10-21 MED ORDER — CHLORHEXIDINE GLUCONATE CLOTH 2 % EX PADS: 6.0000 | MEDICATED_PAD | Freq: Once | CUTANEOUS | Status: DC

## 2018-10-21 MED ORDER — LACTATED RINGERS IV SOLN
INTRAVENOUS | Status: DC | PRN
  Administered 2018-10-21 (×2): via INTRAVENOUS

## 2018-10-21 MED ORDER — DEXAMETHASONE SODIUM PHOSPHATE 10 MG/ML IJ SOLN
INTRAMUSCULAR | Status: DC | PRN
  Administered 2018-10-21: 10 mg via INTRAVENOUS

## 2018-10-21 MED ORDER — FENTANYL CITRATE (PF) 100 MCG/2ML IJ SOLN
INTRAMUSCULAR | Status: DC | PRN
  Administered 2018-10-21: 50 ug via INTRAVENOUS
  Administered 2018-10-21: 25 ug via INTRAVENOUS
  Administered 2018-10-21: 75 ug via INTRAVENOUS
  Administered 2018-10-21 (×2): 50 ug via INTRAVENOUS

## 2018-10-21 MED ORDER — SUGAMMADEX SODIUM 200 MG/2ML IV SOLN
INTRAVENOUS | Status: DC | PRN
  Administered 2018-10-21: 200 mg via INTRAVENOUS

## 2018-10-21 MED ORDER — MIDAZOLAM HCL 2 MG/2ML IJ SOLN
INTRAMUSCULAR | Status: AC
  Filled 2018-10-21: qty 2

## 2018-10-21 MED ORDER — SODIUM CHLORIDE 0.9 % IV SOLN: 2.0000 g | INTRAVENOUS | Status: DC

## 2018-10-21 MED ORDER — LACTATED RINGERS IV SOLN: INTRAVENOUS | Status: DC | PRN

## 2018-10-21 MED ORDER — ONDANSETRON HCL 4 MG/2ML IJ SOLN
INTRAMUSCULAR | Status: AC
  Filled 2018-10-21: qty 2

## 2018-10-21 MED ORDER — SODIUM CHLORIDE 0.9 % IV SOLN
INTRAVENOUS | Status: DC | PRN
  Administered 2018-10-21: 10:00:00 2 g via INTRAVENOUS

## 2018-10-21 MED ORDER — SODIUM CHLORIDE 0.9 % IV SOLN
INTRAVENOUS | Status: DC | PRN
  Administered 2018-10-21: 10:00:00 25 ug/min via INTRAVENOUS

## 2018-10-21 MED ORDER — ROCURONIUM BROMIDE 10 MG/ML (PF) SYRINGE
PREFILLED_SYRINGE | INTRAVENOUS | Status: AC
  Filled 2018-10-21: qty 10

## 2018-10-21 MED ORDER — ROCURONIUM BROMIDE 10 MG/ML (PF) SYRINGE
PREFILLED_SYRINGE | INTRAVENOUS | Status: AC
  Filled 2018-10-21: qty 20

## 2018-10-21 MED ORDER — BUPIVACAINE-EPINEPHRINE (PF) 0.5% -1:200000 IJ SOLN
INTRAMUSCULAR | Status: AC
  Filled 2018-10-21: qty 30

## 2018-10-21 MED ORDER — SODIUM CHLORIDE 0.9 % IR SOLN
Status: DC | PRN
  Administered 2018-10-21: 1000 mL

## 2018-10-21 SURGICAL SUPPLY — 82 items
APPLIER CLIP 5 13 M/L LIGAMAX5 (MISCELLANEOUS)
APPLIER CLIP ROT 10 11.4 M/L (STAPLE)
BLADE 10 SAFETY STRL DISP (BLADE) ×2 IMPLANT
BLADE CLIPPER SURG (BLADE) IMPLANT
CANISTER SUCT 3000ML PPV (MISCELLANEOUS) ×3 IMPLANT
CANISTER WOUND CARE 500ML ATS (WOUND CARE) ×2 IMPLANT
CATH ROBINSON RED A/P 16FR (CATHETERS) ×2 IMPLANT
CELLS DAT CNTRL 66122 CELL SVR (MISCELLANEOUS) IMPLANT
CHLORAPREP W/TINT 26 (MISCELLANEOUS) ×3 IMPLANT
CLIP APPLIE 5 13 M/L LIGAMAX5 (MISCELLANEOUS) IMPLANT
CLIP APPLIE ROT 10 11.4 M/L (STAPLE) IMPLANT
COVER MAYO STAND STRL (DRAPES) ×6 IMPLANT
COVER SURGICAL LIGHT HANDLE (MISCELLANEOUS) ×6 IMPLANT
COVER WAND RF STERILE (DRAPES) ×3 IMPLANT
DERMABOND ADVANCED (GAUZE/BANDAGES/DRESSINGS) ×2
DERMABOND ADVANCED .7 DNX12 (GAUZE/BANDAGES/DRESSINGS) IMPLANT
DRAPE HALF SHEET 40X57 (DRAPES) ×3 IMPLANT
DRAPE UTILITY XL STRL (DRAPES) ×3 IMPLANT
DRAPE WARM FLUID 44X44 (DRAPES) ×3 IMPLANT
DRSG OPSITE POSTOP 4X10 (GAUZE/BANDAGES/DRESSINGS) IMPLANT
DRSG OPSITE POSTOP 4X8 (GAUZE/BANDAGES/DRESSINGS) IMPLANT
ELECT BLADE 6.5 EXT (BLADE) ×3 IMPLANT
ELECT CAUTERY BLADE 6.4 (BLADE) ×6 IMPLANT
ELECT REM PT RETURN 9FT ADLT (ELECTROSURGICAL) ×3
ELECTRODE REM PT RTRN 9FT ADLT (ELECTROSURGICAL) ×1 IMPLANT
GAUZE SPONGE 2X2 8PLY STRL LF (GAUZE/BANDAGES/DRESSINGS) ×1 IMPLANT
GEL ULTRASOUND 20GR AQUASONIC (MISCELLANEOUS) IMPLANT
GLOVE BIOGEL M STRL SZ7.5 (GLOVE) ×6 IMPLANT
GLOVE INDICATOR 8.0 STRL GRN (GLOVE) ×9 IMPLANT
GOWN STRL REUS W/ TWL LRG LVL3 (GOWN DISPOSABLE) ×6 IMPLANT
GOWN STRL REUS W/TWL 2XL LVL3 (GOWN DISPOSABLE) ×6 IMPLANT
GOWN STRL REUS W/TWL LRG LVL3 (GOWN DISPOSABLE) ×12
GRASPER SUT TROCAR 14GX15 (MISCELLANEOUS) ×2 IMPLANT
KIT BASIN OR (CUSTOM PROCEDURE TRAY) ×3 IMPLANT
KIT OSTOMY DRAINABLE 2.75 STR (WOUND CARE) ×2 IMPLANT
KIT TURNOVER KIT B (KITS) ×3 IMPLANT
LEGGING LITHOTOMY PAIR STRL (DRAPES) ×3 IMPLANT
NDL SPNL 22GX3.5 QUINCKE BK (NEEDLE) IMPLANT
NEEDLE SPNL 22GX3.5 QUINCKE BK (NEEDLE) ×3 IMPLANT
NS IRRIG 1000ML POUR BTL (IV SOLUTION) ×6 IMPLANT
PAD ARMBOARD 7.5X6 YLW CONV (MISCELLANEOUS) ×6 IMPLANT
PENCIL SMOKE EVACUATOR (MISCELLANEOUS) ×6 IMPLANT
RETRACTOR WND ALEXIS 18 MED (MISCELLANEOUS) IMPLANT
RTRCTR WOUND ALEXIS 18CM MED (MISCELLANEOUS)
SCISSORS LAP 5X35 DISP (ENDOMECHANICALS) ×3 IMPLANT
SET IRRIG TUBING LAPAROSCOPIC (IRRIGATION / IRRIGATOR) IMPLANT
SET TUBE SMOKE EVAC HIGH FLOW (TUBING) ×3 IMPLANT
SHEARS HARMONIC ACE PLUS 36CM (ENDOMECHANICALS) ×3 IMPLANT
SLEEVE ENDOPATH XCEL 5M (ENDOMECHANICALS) ×5 IMPLANT
SPECIMEN JAR LARGE (MISCELLANEOUS) ×3 IMPLANT
SPONGE GAUZE 2X2 STER 10/PKG (GAUZE/BANDAGES/DRESSINGS) ×2
SPONGE LAP 18X18 RF (DISPOSABLE) ×2 IMPLANT
STAPLER VISISTAT 35W (STAPLE) ×3 IMPLANT
SUCTION POOLE TIP (SUCTIONS) ×2 IMPLANT
SURGILUBE 2OZ TUBE FLIPTOP (MISCELLANEOUS) ×3 IMPLANT
SUT ETHILON 2 0 FS 18 (SUTURE) ×2 IMPLANT
SUT MNCRL AB 4-0 PS2 18 (SUTURE) ×2 IMPLANT
SUT PROLENE 2 0 CT2 30 (SUTURE) IMPLANT
SUT PROLENE 2 0 KS (SUTURE) IMPLANT
SUT SILK 2 0 (SUTURE) ×2
SUT SILK 2 0 SH CR/8 (SUTURE) ×3 IMPLANT
SUT SILK 2-0 18XBRD TIE 12 (SUTURE) ×1 IMPLANT
SUT SILK 3 0 (SUTURE) ×2
SUT SILK 3 0 SH CR/8 (SUTURE) ×3 IMPLANT
SUT SILK 3-0 18XBRD TIE 12 (SUTURE) ×1 IMPLANT
SUT VIC AB 3-0 SH 8-18 (SUTURE) ×2 IMPLANT
SUT VICRYL 0 TIES 12 18 (SUTURE) ×2 IMPLANT
SYR BULB IRRIGATION 50ML (SYRINGE) ×3 IMPLANT
SYS LAPSCP GELPORT 120MM (MISCELLANEOUS)
SYSTEM LAPSCP GELPORT 120MM (MISCELLANEOUS) IMPLANT
TOWEL GREEN STERILE (TOWEL DISPOSABLE) ×6 IMPLANT
TRAY FOLEY MTR SLVR 16FR STAT (SET/KITS/TRAYS/PACK) ×3 IMPLANT
TRAY LAPAROSCOPIC MC (CUSTOM PROCEDURE TRAY) ×3 IMPLANT
TRAY PROCTOSCOPIC FIBER OPTIC (SET/KITS/TRAYS/PACK) ×3 IMPLANT
TROCAR XCEL 12X100 BLDLESS (ENDOMECHANICALS) ×2 IMPLANT
TROCAR XCEL BLUNT TIP 100MML (ENDOMECHANICALS) IMPLANT
TROCAR XCEL NON-BLD 11X100MML (ENDOMECHANICALS) IMPLANT
TROCAR XCEL NON-BLD 5MMX100MML (ENDOMECHANICALS) ×3 IMPLANT
TUBE CONNECTING 12'X1/4 (SUCTIONS) ×2
TUBE CONNECTING 12X1/4 (SUCTIONS) ×4 IMPLANT
WATER STERILE IRR 1000ML POUR (IV SOLUTION) ×3 IMPLANT
YANKAUER SUCT BULB TIP NO VENT (SUCTIONS) ×6 IMPLANT

## 2018-10-21 NOTE — Op Note (Signed)
NAMETREVINO, Benjamin MEDICAL RECORD ZO:[id number] ACCOUNT [id number] DATE OF BIRTH:08-Mar-1971 FACILITY: [facility] LOCATION: [facility] PHYSICIAN:Kaylinn Dedic Ronnie Derby, MD  OPERATIVE REPORT  DATE OF PROCEDURE:  10/21/2018  PREOPERATIVE DIAGNOSES:   1.  Large sacral decubitus ulcer. 2.  Transverse myelitis. 3.  Need for fecal diversion.  POSTOPERATIVE DIAGNOSES:   1.  Large sacral decubitus ulcer. 2.  Transverse myelitis. 3.  Need for fecal diversion. 4.  Incarcerated umbilical hernia.  PROCEDURE:   1.  Laparoscopic distal transverse loop colostomy. 2.  Laparoscopic primary repair of incarcerated umbilical hernia.  SURGEON:  [name], MD  ASSISTANT:  None.  ANESTHESIA:  General.  ESTIMATED BLOOD LOSS:  Less than 50 units.    Local with Marcaine.  SPECIMEN:  None.  INDICATIONS:  The patient is a 48 year old gentleman who has been in [facility] long-term care facility since 06/05 after a prolonged hospitalization at [facility] for transverse myelitis.  He developed pulmonary emboli and respiratory failure while  there and required ECMO and then ventilator-associated pneumonia and ultimately ended up getting a tracheostomy and PEG placement prior to transfer to [facility].  He had a large sacral decubitus wound on admission to [facility] and has undergone multiple  debridements of the wound by Dr.  Noberto Retort.  It was felt that the wound around the sacrum would not heal without fecal diversion.  The patient is bedbound at this point and neither the general surgeon who had been following him nor the neurology team  felt that he would ever gain rectal tone and felt the patient would benefit from colostomy.  I met with the patient and the mother in the emergency room the other day and had extensive discussion regarding diverting colostomy.  We discussed the pros and  cons of a loop versus end colostomy.  We discussed colostomy issues which are separately documented.  DESCRIPTION OF PROCEDURE:   The patient received a gentle bowel prep yesterday and his IV heparin was stopped a few hours prior to surgery.  He was brought to the preop area where I talked with his mother on the phone to see if she had any additional  questions.  The patient was given 5000 units of subcutaneous heparin for DVT prophylaxis.  He was then taken to OR #1 at [facility], placed supine on the operating table.  His trach was connected to the anesthesia circuit and full general  endotracheal anesthesia was established.  Sequential compression devices were placed.  His right arm was tucked at the side with the appropriate padding.  He had some skin abrasions in the right lower pannus and some skin abrasions underneath his G-tube  flange.  His abdomen was prepped and draped in the usual standard surgical fashion with ChloraPrep and Betadine around the G-tube.  We draped the G-tube outside of the field.  A wound care nurse had marked the patient for a colostomy preoperatively.  He  received IV antibiotic prior to skin incision.  A surgical timeout was performed.  I elected to gain access to the abdomen with Optiview technique.  A small incision was made in the right subcostal margin in the midclavicular line.  Then, using a 0 degree 5 mm laparoscope I advanced it through all layers of abdominal wall and entered  the abdominal cavity carefully.  Pneumoperitoneum was smoothly established up to a patient pressure of 15 mmHg.  The laparoscope was advanced and the abdominal cavity was surveilled.  There was no evidence of injury to surrounding  structures.  The  patient had some omentum incarcerated in a small umbilical hernia.  Two additional 5 mm trocars were placed in the right lateral abdominal wall all under direct visualization after local had been infiltrated.  I inspected the descending colon and the  sigmoid colon.  He really did not have a redundant sigmoid or descending colon.  His mesentery was shortened.  Based  on this, I felt that an end colostomy would be difficult to achieve given the patient's severe obesity, abdominal wall thickness and  shortened mesentery, so I felt the best option at this point was a loop colostomy.  At this point, because I decided to proceed with a loop colostomy, I needed to reduce the incarcerated omentum from the umbilical defect.  This was done with blunt  dissection as well as with Harmonic scalpel.  This left essentially about a 1.5 cm fascial defect at the umbilicus.  I grabbed the transverse colon and lifted the omentum upward.  Then, using Harmonic scalpel, I mobilized the omentum off the transverse  colon starting in the proximal transverse colon extending all the way to the distal transverse colon.  I reduced pneumoperitoneum some and then using local with a needle, I placed local through the previously placed skin mark in the left mid abdomen  where the nurse had marked the patient for colostomy.  It was a little bit lower than where the transverse colon lay so I ended up deciding to make the colostomy skin defect slightly above the mark and slightly medial.  I felt that this location was  still safe and would still have enough skin in order to pouch with an ostomy device away from the G-tube.  Using a Kocher, I grabbed the skin in the predetermined area and made a circular skin incision with a 10 blade.  The subcutaneous tissue was spread  with [employer]'s and then subsequently appendiceal retractors.  The anterior fascia was incised longitudinally and the rectus muscle was then spread with a Claiborne Billings.  At this point, we returned laparoscopic and I placed a bowel grasper on the transverse  colon directly underneath where I was getting ready to make my peritoneal incision.  I then incised the peritoneal cavity with cautery, ensuring that the bowel was well away.  I enlarged the peritoneal opening slightly where it would accommodate two  fingers.  I then placed a Babcock through  the defect and delivered the distal transverse colon and took the laparoscopic bowel grasper off.  I then ended up mobilizing and taking down some epiploic appendages in order to facilitate the colon coming up  through the ostomy defect.  This was done with Harmonic scalpel.  I was able to achieve adequate mobilization of the transverse colon up through the abdominal wall.  It did not appear to be under too much tension.  I was able to get my finger around the  width of the transverse colon and make a small defect in the mesentery.  I then passed a red rubber catheter underneath the colon to serve as an ostomy bridge temporarily.  At this point, I returned laparoscopically for one final check just to make sure  that the bowel was not twisted.  It should be noted that prior to delivering the colostomy and making the skin defect I did primarily repair the umbilical hernia fascial defect with three interrupted 0 Vicryl sutures using a PMI suture passer and this  was done laparoscopically.  At this point, there was  no fascial defect in the umbilicus.  The fascia was well approximated.  At this point, I released pneumoperitoneum, removed the remaining trocars, closed the three trocar sites with a 4-0 Monocryl in a subcuticular fashion.  We then draped that area away since we were going to mature the colostomy this point.  A longitudinal  incision was made along the tenia coli with the Bovie electrocautery.  The distal transverse colon lumen was entered.  I then enlarged the opening some more.  I was able to digitize both the proximal and distal loops of the ends of the colon easily.  I  then placed multiple 3-0 Vicryl sutures and matured the loop colostomy in a Brooke fashion.  It was a little bit edematous at the end but again it appeared viable and had adequate blood flow.  I then trimmed the red rubber catheter and secured the two  ends together with a 2-0 nylon to serve as a temporary ostomy bridge.  We then  placed an ostomy device and placed Dermabond on the other trocar sites.  All needle, instrument and sponge counts were correct x2.  The patient was taken back to [facility] for resuscitation.  I updated the mother at the end of the case.  TN/NUANCE  D:10/21/2018 T:10/21/2018 JOB:[id number]

## 2018-10-21 NOTE — Progress Notes (Signed)
Patient to be discharged back to [facility] from PACU G-Tube can be used for meds tonight. Tube feeds can be resumed tomorrow at a low dose and gently titrated back to goal. Please hold systemic anticoagulation till tomorrow. IV Heparin can be resumed tomorrow as drip, no bolus.  Patient will need Ostomy care. Please have the [facility] [place] nurse see him.  Please leave Ostomy bridge for 5 days.  Please call back with any questions or concerns. I discussed this plan of care with Dr. Owens Shark of [facility]

## 2018-10-21 NOTE — Anesthesia Postprocedure Evaluation (Signed)
Anesthesia Post Note  Patient: Mung Rinker  Procedure(s) Performed: LAPAROSCOPIC TRANSVERSE COLON LOOP COLOSTOMY (N/A Abdomen) Laparoscopic primary repair of incarcerated Umbilical Hernia (N/A Abdomen)     Patient location during evaluation: SICU [facility]) Anesthesia Type: General Post-procedure mental status: Trach in place. Pain management: pain level controlled Vital Signs Assessment: post-procedure vital signs reviewed and stable Respiratory status: patient remains intubated per anesthesia plan Cardiovascular status: stable Postop Assessment: no apparent nausea or vomiting Anesthetic complications: no Comments: Transported to [facility] on ambu-bag connected to tracheostomy.  Connected to ventilator on arrival to [facility]. VSS.    Last Vitals: There were no vitals filed for this visit.  Last Pain: There were no vitals filed for this visit.               Catalina Gravel

## 2018-10-21 NOTE — Anesthesia Preprocedure Evaluation (Signed)
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  Reproductive/Obstetrics                             Anesthesia Physical Anesthesia Plan  ASA: IV  Anesthesia Plan: General   Post-op Pain Management:    Induction: Intravenous and Inhalational  PONV Risk Score and Plan: 2  Airway Management Planned: Tracheostomy  Additional Equipment:   Intra-op Plan:   Post-operative Plan: Post-operative intubation/ventilation  Informed Consent: I have reviewed the patients History and Physical, chart, labs and discussed the procedure including the risks, benefits and alternatives for the proposed anesthesia with the patient or authorized representative who has indicated his/her understanding and acceptance.     Dental advisory given  Plan Discussed with: CRNA  Anesthesia Plan Comments:         Anesthesia Quick Evaluation

## 2018-10-21 NOTE — Interval H&P Note (Signed)
History and Physical Interval Note:  10/21/2018 9:06 AM  Drue Novel  has presented today for surgery, with the diagnosis of sacral decubitus ulcer, transverse myelitis.  The various methods of treatment have been discussed with the patient and family. After consideration of risks, benefits and other options for treatment, the patient has consented to  Procedure(s): LAPAROSCOPIC END COLOSTOMY (N/A) as a surgical intervention.  The patient's history has been reviewed, patient examined, no change in status, stable for surgery.  I have reviewed the patient's chart and labs.  Questions were answered to the patient's satisfaction.    D/w with mom on phone again.  Plan will be lap end colostomy vs loop - whichever is technically easier given his severe obesity; mesentery mobility.  rediscussed risk/benefits/ostomy issues.  All questions asked and answered IV abx Hep gtt off Given obesity/recent blood clots, will give subcu heparin on call to [place]. Redmond Pulling, MD, FACS General, Bariatric, & Minimally Invasive Surgery [facility] [place]  Greer Pickerel

## 2018-10-21 NOTE — Brief Op Note (Signed)
10/21/2018  12:14 PM  PATIENT:  Benjamin Montoya  48 y.o. male  PRE-OPERATIVE DIAGNOSIS:  Sacral Decubitus Ulcer, Transverse Myelitis, need for fecal diversion  POST-OPERATIVE DIAGNOSIS:  Sacral decubitus ulcer, transverse myelitis, need for fecal diversion, incarcerated umbilical hernia  PROCEDURE:  Procedure(s): LAPAROSCOPIC DISTAL TRANSVERSE COLON LOOP COLOSTOMY (N/A) Laparoscopic primary repair of incarcerated Umbilical Hernia (N/A)  SURGEON:  Surgeon(s) and Role:    Greer Pickerel, MD - Primary  PHYSICIAN ASSISTANT:   ASSISTANTS: none   ANESTHESIA:   general  EBL:  50 mL   BLOOD ADMINISTERED:none  DRAINS: Gastrostomy Tube   LOCAL MEDICATIONS USED:  MARCAINE     SPECIMEN:  No Specimen  DISPOSITION OF SPECIMEN:  N/A  COUNTS:  YES  TOURNIQUET:  * No tourniquets in log *  DICTATION: .Other Dictation: Dictation Number [id number]  PLAN OF CARE: [facility]  PATIENT DISPOSITION:  stable [facility]   Delay start of Pharmacological VTE agent (>24hrs) due to surgical blood loss or risk of bleeding: no - can start Friday AM  Updated mom on phone  Benjamin Montoya. Redmond Pulling, MD, FACS General, Bariatric, & Minimally Invasive Surgery [facility], [place]

## 2018-10-21 NOTE — Consult Note (Signed)
[place] Nurse requested for preoperative stoma site marking  Discussed surgical procedure and stoma creation with patient.   Examined patient lying, sitting position in the bed in order to place the marking in the patient's visual field, away from any creases or abdominal contour issues and within the rectus muscle. Patient is vented with trach but can answer yes and no questions. Patient does not walk and does not sit in the chair.   Marked for colostomy in the LLQ  _4___ cm to the left of the umbilicus and __1__ID above the umbilicus. Site was covered with thin film, he is being transported directly to the OR.    Plan for patient to return to [facility] from the OR today.   [place] MSN, [place], [place], [place]

## 2018-10-21 NOTE — Anesthesia Procedure Notes (Signed)
Date/Time: 10/21/2018 9:43 AM Performed by: Lowella Dell, CRNA Pre-anesthesia Checklist: Patient identified, Emergency Drugs available, Suction available, Patient being monitored and Timeout performed Patient Re-evaluated:Patient Re-evaluated prior to induction Oxygen Delivery Method: Circle system utilized Preoxygenation: Pre-oxygenation with 100% oxygen Induction Type: Combination inhalational/ intravenous induction and Tracheostomy Tube size: 6.0 (Shiley) mm Placement Confirmation: ETT inserted through vocal cords under direct vision,  positive ETCO2 and breath sounds checked- equal and bilateral Dental Injury: Teeth and Oropharynx as per pre-operative assessment

## 2018-10-21 NOTE — Transfer of Care (Signed)
Immediate Anesthesia Transfer of Care Note  Patient: Keegan Ducey  Procedure(s) Performed: LAPAROSCOPIC TRANSVERSE COLON LOOP COLOSTOMY (N/A Abdomen) Laparoscopic primary repair of incarcerated Umbilical Hernia (N/A Abdomen)  Patient Location: PACU and [facility]. Report to [facility] RN.  Anesthesia Type:General  Level of Consciousness: awake and patient cooperative  Airway & Oxygen Therapy: Patient placed on Ventilator (see vital sign flow sheet for setting)  Post-op Assessment: Report given to RN and Post -op Vital signs reviewed and stable  Post vital signs: Reviewed and stable  Last Vitals:  Vitals Value Taken Time  BP 140/81   Temp    Pulse 78   Resp    SpO2 96     Last Pain: There were no vitals filed for this visit.       Complications: No apparent anesthesia complications

## 2018-10-21 NOTE — Anesthesia Preprocedure Evaluation (Signed)
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  Reproductive/Obstetrics                            Anesthesia Physical Anesthesia Plan  ASA: IV  Anesthesia Plan: General   Post-op Pain Management:    Induction: Intravenous  PONV Risk Score and Plan: 2 and Dexamethasone and Ondansetron  Airway Management Planned: Tracheostomy  Additional Equipment:   Intra-op Plan:   Post-operative Plan: Post-operative intubation/ventilation  Informed Consent: I have reviewed the patients History and Physical, chart, labs and discussed the procedure including the risks, benefits and alternatives for the proposed anesthesia with the patient or authorized representative who has indicated his/her understanding and acceptance.     Dental advisory given  Plan Discussed with: CRNA  Anesthesia Plan Comments:         Anesthesia Quick Evaluation

## 2018-10-22 ENCOUNTER — Encounter (HOSPITAL_COMMUNITY): Payer: Self-pay | Admitting: General Surgery

## 2018-10-22 DIAGNOSIS — J9621 Acute and chronic respiratory failure with hypoxia: Secondary | ICD-10-CM

## 2018-10-22 DIAGNOSIS — J189 Pneumonia, unspecified organism: Secondary | ICD-10-CM

## 2018-10-22 DIAGNOSIS — I2699 Other pulmonary embolism without acute cor pulmonale: Secondary | ICD-10-CM

## 2018-10-22 DIAGNOSIS — G373 Acute transverse myelitis in demyelinating disease of central nervous system: Secondary | ICD-10-CM

## 2018-10-22 LAB — CBC
HCT: 32.1 % — ABNORMAL LOW (ref 39.0–52.0)
Hemoglobin: 9.5 g/dL — ABNORMAL LOW (ref 13.0–17.0)
MCH: 25.1 pg — ABNORMAL LOW (ref 26.0–34.0)
MCHC: 29.6 g/dL — ABNORMAL LOW (ref 30.0–36.0)
MCV: 84.9 fL (ref 80.0–100.0)
Platelets: 348 10*3/uL (ref 150–400)
RBC: 3.78 MIL/uL — ABNORMAL LOW (ref 4.22–5.81)
RDW: 17.1 % — ABNORMAL HIGH (ref 11.5–15.5)
WBC: 12 10*3/uL — ABNORMAL HIGH (ref 4.0–10.5)
nRBC: 0 % (ref 0.0–0.2)

## 2018-10-22 LAB — PROTIME-INR
INR: 1.4 — ABNORMAL HIGH (ref 0.8–1.2)
Prothrombin Time: 16.7 seconds — ABNORMAL HIGH (ref 11.4–15.2)

## 2018-10-22 LAB — APTT: aPTT: 38 seconds — ABNORMAL HIGH (ref 24–36)

## 2018-10-22 LAB — HEPARIN LEVEL (UNFRACTIONATED): Heparin Unfractionated: <0.1 IU/mL — ABNORMAL LOW (ref 0.30–0.70)

## 2018-10-22 NOTE — Progress Notes (Addendum)
Pulmonary Critical Care Medicine [place]   PULMONARY CRITICAL CARE SERVICE  PROGRESS NOTE  Date of Service: 10/22/2018  Benjamin Montoya  FIE:[id number]  DOB: 1970/12/02   DOA: 10/21/2018  Referring Physician: Merton Border, MD  HPI: Benjamin Montoya is a 48 y.o. male seen for follow up of Acute on Chronic Respiratory Failure.  Patient remains on pressure support 12/5 FiO2 4% in the evening and resting on aerosol trach collar during the day.  Medications: Reviewed on Rounds  Physical Exam:  Vitals: Pulse 77 which is 18 BP 129/58 O2 sat 97% temp 98.5  Ventilator Settings ATC 40%  . General: Comfortable at this time . Eyes: Grossly normal lids, irises & conjunctiva . ENT: grossly tongue is normal . Neck: no obvious mass . Cardiovascular: S1 S2 normal no gallop . Respiratory: No rales or rhonchi noted . Abdomen: soft . Skin: no rash seen on limited exam . Musculoskeletal: not rigid . Psychiatric:unable to assess . Neurologic: no seizure no involuntary movements         Lab Data:   Basic Metabolic Panel: Recent Labs  Lab 10/17/18 0813 10/20/18 1246 10/21/18 0521  NA 137 137 135  K 4.5 3.7 3.7  CL 96* 98 96*  CO2 27 31 27   GLUCOSE 110* 106* 82  BUN 22* 22* 17  CREATININE 0.36* 0.33* 0.33*  CALCIUM 9.4 9.6 9.7    ABG: No results for input(s): PHART, PCO2ART, PO2ART, HCO3, O2SAT in the last 168 hours.  Liver Function Tests: No results for input(s): AST, ALT, ALKPHOS, BILITOT, PROT, ALBUMIN in the last 168 hours. No results for input(s): LIPASE, AMYLASE in the last 168 hours. No results for input(s): AMMONIA in the last 168 hours.  CBC: Recent Labs  Lab 10/17/18 0851 10/19/18 1945 10/20/18 1246 10/21/18 0521 10/22/18 1146  WBC 9.5 15.4* 13.6* 12.7* 12.0*  HGB 10.2* 10.8* 9.9* 9.7* 9.5*  HCT 33.9* 36.3* 33.6* 32.3* 32.1*  MCV 84.8 85.2 85.5 83.9 84.9  PLT 389 398 372 358 348    Cardiac Enzymes: No results for input(s): CKTOTAL, CKMB,  CKMBINDEX, TROPONINI in the last 168 hours.  BNP (last 3 results) No results for input(s): BNP in the last 8760 hours.  ProBNP (last 3 results) No results for input(s): PROBNP in the last 8760 hours.  Radiological Exams: No results found.  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 Acute on chronic respiratory failure with hypoxia we will continue with T collar trials titrate oxygen continue pulmonary toilet.  Continue using the PMV 2. Acute transverse myelitis at baseline continue supportive care 3. Healthcare associated pneumonia treated 4. Pulmonary embolism at baseline we will continue to monitor   I have personally seen and evaluated the patient, evaluated laboratory and imaging results, formulated the assessment and plan and placed orders. The Patient requires high complexity decision making for assessment and support.  Case was discussed on Rounds with the Respiratory Therapy Staff  Allyne Gee, MD [facility] Pulmonary Critical Care Medicine Sleep Medicine

## 2018-10-23 LAB — BLOOD GAS, ARTERIAL
Acid-Base Excess: 4.9 mmol/L — ABNORMAL HIGH (ref 0.0–2.0)
Bicarbonate: 28 mmol/L (ref 20.0–28.0)
FIO2: 40
MECHVT: 550 mL
O2 Saturation: 95.2 %
PEEP: 5 cmH2O
Patient temperature: 98.6
RATE: 18 resp/min
pCO2 arterial: 34.7 mmHg (ref 32.0–48.0)
pH, Arterial: 7.518 — ABNORMAL HIGH (ref 7.350–7.450)
pO2, Arterial: 69.8 mmHg — ABNORMAL LOW (ref 83.0–108.0)

## 2018-10-23 LAB — HEPARIN LEVEL (UNFRACTIONATED)
Heparin Unfractionated: 0.66 IU/mL (ref 0.30–0.70)
Heparin Unfractionated: 0.31 IU/mL (ref 0.30–0.70)
Heparin Unfractionated: 0.1 IU/mL — ABNORMAL LOW (ref 0.30–0.70)

## 2018-10-24 DIAGNOSIS — G373 Acute transverse myelitis in demyelinating disease of central nervous system: Secondary | ICD-10-CM | POA: Diagnosis not present

## 2018-10-24 DIAGNOSIS — J189 Pneumonia, unspecified organism: Secondary | ICD-10-CM | POA: Diagnosis not present

## 2018-10-24 DIAGNOSIS — J9621 Acute and chronic respiratory failure with hypoxia: Secondary | ICD-10-CM | POA: Diagnosis not present

## 2018-10-24 DIAGNOSIS — I2699 Other pulmonary embolism without acute cor pulmonale: Secondary | ICD-10-CM | POA: Diagnosis not present

## 2018-10-24 LAB — COMPREHENSIVE METABOLIC PANEL
ALT: 65 U/L — ABNORMAL HIGH (ref 0–44)
AST: 32 U/L (ref 15–41)
Albumin: 3 g/dL — ABNORMAL LOW (ref 3.5–5.0)
Alkaline Phosphatase: 55 U/L (ref 38–126)
Anion gap: 10 (ref 5–15)
BUN: 12 mg/dL (ref 6–20)
CO2: 25 mmol/L (ref 22–32)
Calcium: 9.7 mg/dL (ref 8.9–10.3)
Chloride: 105 mmol/L (ref 98–111)
Creatinine, Ser: <0.3 mg/dL — ABNORMAL LOW (ref 0.61–1.24)
Glucose, Bld: 98 mg/dL (ref 70–99)
Potassium: 3.3 mmol/L — ABNORMAL LOW (ref 3.5–5.1)
Sodium: 140 mmol/L (ref 135–145)
Total Bilirubin: 0.5 mg/dL (ref 0.3–1.2)
Total Protein: 6.5 g/dL (ref 6.5–8.1)

## 2018-10-24 LAB — CBC
HCT: 32.6 % — ABNORMAL LOW (ref 39.0–52.0)
Hemoglobin: 9.8 g/dL — ABNORMAL LOW (ref 13.0–17.0)
MCH: 25.4 pg — ABNORMAL LOW (ref 26.0–34.0)
MCHC: 30.1 g/dL (ref 30.0–36.0)
MCV: 84.5 fL (ref 80.0–100.0)
Platelets: 322 10*3/uL (ref 150–400)
RBC: 3.86 MIL/uL — ABNORMAL LOW (ref 4.22–5.81)
RDW: 17 % — ABNORMAL HIGH (ref 11.5–15.5)
WBC: 13.7 10*3/uL — ABNORMAL HIGH (ref 4.0–10.5)
nRBC: 0 % (ref 0.0–0.2)

## 2018-10-24 LAB — HEPARIN LEVEL (UNFRACTIONATED)
Heparin Unfractionated: 0.58 IU/mL (ref 0.30–0.70)
Heparin Unfractionated: 0.21 IU/mL — ABNORMAL LOW (ref 0.30–0.70)
Heparin Unfractionated: 0.52 IU/mL (ref 0.30–0.70)

## 2018-10-24 NOTE — Progress Notes (Signed)
Pulmonary Critical Care Medicine [place]   PULMONARY CRITICAL CARE SERVICE  PROGRESS NOTE  Date of Service: 10/24/2018  Benjamin Montoya  FWY:[id number]  DOB: 1970/05/14   DOA: 10/21/2018  Referring Physician: Merton Border, MD  HPI: Benjamin Montoya is a 48 y.o. male seen for follow up of Acute on Chronic Respiratory Failure.  Patient is resting comfortably on T collar awake now has been using 40% FiO2 and resting on the ventilator at nighttime  Medications: Reviewed on Rounds  Physical Exam:  Vitals: Temperature 97.4 pulse 66 respiratory 18 blood pressure 117/62 saturations 97%  Ventilator Settings off ventilator on T collar at this time  . General: Comfortable at this time . Eyes: Grossly normal lids, irises & conjunctiva . ENT: grossly tongue is normal . Neck: no obvious mass . Cardiovascular: S1 S2 normal no gallop . Respiratory: No rhonchi no rales are noted . Abdomen: soft . Skin: no rash seen on limited exam . Musculoskeletal: not rigid . Psychiatric:unable to assess . Neurologic: no seizure no involuntary movements         Lab Data:   Basic Metabolic Panel: Recent Labs  Lab 10/20/18 1246 10/21/18 0521 10/24/18 0155  NA 137 135 140  K 3.7 3.7 3.3*  CL 98 96* 105  CO2 31 27 25   GLUCOSE 106* 82 98  BUN 22* 17 12  CREATININE 0.33* 0.33* <0.30*  CALCIUM 9.6 9.7 9.7    ABG: Recent Labs  Lab 10/23/18 0100  PHART 7.518*  PCO2ART 34.7  PO2ART 69.8*  HCO3 28.0  O2SAT 95.2    Liver Function Tests: Recent Labs  Lab 10/24/18 0155  AST 32  ALT 65*  ALKPHOS 55  BILITOT 0.5  PROT 6.5  ALBUMIN 3.0*   No results for input(s): LIPASE, AMYLASE in the last 168 hours. No results for input(s): AMMONIA in the last 168 hours.  CBC: Recent Labs  Lab 10/19/18 1945 10/20/18 1246 10/21/18 0521 10/22/18 1146 10/24/18 0155  WBC 15.4* 13.6* 12.7* 12.0* 13.7*  HGB 10.8* 9.9* 9.7* 9.5* 9.8*  HCT 36.3* 33.6* 32.3* 32.1* 32.6*  MCV 85.2  85.5 83.9 84.9 84.5  PLT 398 372 358 348 322    Cardiac Enzymes: No results for input(s): CKTOTAL, CKMB, CKMBINDEX, TROPONINI in the last 168 hours.  BNP (last 3 results) No results for input(s): BNP in the last 8760 hours.  ProBNP (last 3 results) No results for input(s): PROBNP in the last 8760 hours.  Radiological Exams: No results found.  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 Acute on chronic respiratory failure with hypoxia we will continue with the T collar during the daytime rest on the ventilator at nighttime. 2. Acute transverse myelitis grossly unchanged we will continue present management 3. Healthcare associated pneumonia treated resolved 4. Pulmonary embolism is stable we will continue to follow   I have personally seen and evaluated the patient, evaluated laboratory and imaging results, formulated the assessment and plan and placed orders. The Patient requires high complexity decision making for assessment and support.  Case was discussed on Rounds with the Respiratory Therapy Staff  Allyne Gee, MD [facility] Pulmonary Critical Care Medicine Sleep Medicine

## 2018-10-25 DIAGNOSIS — J9621 Acute and chronic respiratory failure with hypoxia: Secondary | ICD-10-CM | POA: Diagnosis not present

## 2018-10-25 DIAGNOSIS — J189 Pneumonia, unspecified organism: Secondary | ICD-10-CM | POA: Diagnosis not present

## 2018-10-25 DIAGNOSIS — G373 Acute transverse myelitis in demyelinating disease of central nervous system: Secondary | ICD-10-CM | POA: Diagnosis not present

## 2018-10-25 DIAGNOSIS — I2699 Other pulmonary embolism without acute cor pulmonale: Secondary | ICD-10-CM | POA: Diagnosis not present

## 2018-10-25 LAB — BASIC METABOLIC PANEL
Anion gap: 11 (ref 5–15)
BUN: 12 mg/dL (ref 6–20)
CO2: 26 mmol/L (ref 22–32)
Calcium: 10 mg/dL (ref 8.9–10.3)
Chloride: 105 mmol/L (ref 98–111)
Creatinine, Ser: 0.37 mg/dL — ABNORMAL LOW (ref 0.61–1.24)
GFR calc Af Amer: >60 mL/min (ref >60)
GFR calc non Af Amer: >60 mL/min (ref >60)
Glucose, Bld: 89 mg/dL (ref 70–99)
Potassium: 3.2 mmol/L — ABNORMAL LOW (ref 3.5–5.1)
Sodium: 142 mmol/L (ref 135–145)

## 2018-10-25 LAB — HEPARIN LEVEL (UNFRACTIONATED)
Heparin Unfractionated: 0.25 IU/mL — ABNORMAL LOW (ref 0.30–0.70)
Heparin Unfractionated: 0.29 IU/mL — ABNORMAL LOW (ref 0.30–0.70)

## 2018-10-25 NOTE — Progress Notes (Signed)
Pulmonary Critical Care Medicine [place]   PULMONARY CRITICAL CARE SERVICE  PROGRESS NOTE  Date of Service: 10/25/2018  Benjamin Montoya  NOM:[id number]  DOB: 1970-11-16   DOA: 10/21/2018  Referring Physician: Merton Border, MD  HPI: Benjamin Montoya is a 47 y.o. male seen for follow up of Acute on Chronic Respiratory Failure.  Currently patient is on full support on the ventilator on assist control mode had some issues with bradycardia so weaning is being held  Medications: Reviewed on Rounds  Physical Exam:  Vitals: Temperature is 98.5 pulse 50 respiratory rate 14 blood pressure 103/55 saturations 96%  Ventilator Settings mode ventilation assist control FiO2 40% tidal volume 567 PEEP 5  . General: Comfortable at this time . Eyes: Grossly normal lids, irises & conjunctiva . ENT: grossly tongue is normal . Neck: no obvious mass . Cardiovascular: S1 S2 normal no gallop . Respiratory: No rhonchi no rales are noted at this time . Abdomen: soft . Skin: no rash seen on limited exam . Musculoskeletal: not rigid . Psychiatric:unable to assess . Neurologic: no seizure no involuntary movements         Lab Data:   Basic Metabolic Panel: Recent Labs  Lab 10/20/18 1246 10/21/18 0521 10/24/18 0155 10/25/18 0604  NA 137 135 140 142  K 3.7 3.7 3.3* 3.2*  CL 98 96* 105 105  CO2 31 27 25 26   GLUCOSE 106* 82 98 89  BUN 22* 17 12 12   CREATININE 0.33* 0.33* <0.30* 0.37*  CALCIUM 9.6 9.7 9.7 10.0    ABG: Recent Labs  Lab 10/23/18 0100  PHART 7.518*  PCO2ART 34.7  PO2ART 69.8*  HCO3 28.0  O2SAT 95.2    Liver Function Tests: Recent Labs  Lab 10/24/18 0155  AST 32  ALT 65*  ALKPHOS 55  BILITOT 0.5  PROT 6.5  ALBUMIN 3.0*   No results for input(s): LIPASE, AMYLASE in the last 168 hours. No results for input(s): AMMONIA in the last 168 hours.  CBC: Recent Labs  Lab 10/19/18 1945 10/20/18 1246 10/21/18 0521 10/22/18 1146 10/24/18 0155  WBC  15.4* 13.6* 12.7* 12.0* 13.7*  HGB 10.8* 9.9* 9.7* 9.5* 9.8*  HCT 36.3* 33.6* 32.3* 32.1* 32.6*  MCV 85.2 85.5 83.9 84.9 84.5  PLT 398 372 358 348 322    Cardiac Enzymes: No results for input(s): CKTOTAL, CKMB, CKMBINDEX, TROPONINI in the last 168 hours.  BNP (last 3 results) No results for input(s): BNP in the last 8760 hours.  ProBNP (last 3 results) No results for input(s): PROBNP in the last 8760 hours.  Radiological Exams: No results found.  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 Acute on chronic respiratory failure with hypoxia we will continue with full support on the ventilator and assist control mode currently on 40% FiO2 hold off on doing weaning for now. 2. Transverse myelitis supportive care at this time 3. Healthcare associated pneumonia treated clinically improved 4. Pulmonary embolism treated we will continue with supportive care and follow   I have personally seen and evaluated the patient, evaluated laboratory and imaging results, formulated the assessment and plan and placed orders. The Patient requires high complexity decision making for assessment and support.  Case was discussed on Rounds with the Respiratory Therapy Staff  Allyne Gee, MD [facility] Pulmonary Critical Care  Medicine Sleep Medicine

## 2018-10-26 DIAGNOSIS — J189 Pneumonia, unspecified organism: Secondary | ICD-10-CM | POA: Diagnosis not present

## 2018-10-26 DIAGNOSIS — J9621 Acute and chronic respiratory failure with hypoxia: Secondary | ICD-10-CM | POA: Diagnosis not present

## 2018-10-26 DIAGNOSIS — I2699 Other pulmonary embolism without acute cor pulmonale: Secondary | ICD-10-CM | POA: Diagnosis not present

## 2018-10-26 DIAGNOSIS — G373 Acute transverse myelitis in demyelinating disease of central nervous system: Secondary | ICD-10-CM | POA: Diagnosis not present

## 2018-10-26 LAB — PROTIME-INR
INR: 1.1 (ref 0.8–1.2)
Prothrombin Time: 14.1 seconds (ref 11.4–15.2)

## 2018-10-26 LAB — HEPARIN LEVEL (UNFRACTIONATED)
Heparin Unfractionated: 0.13 IU/mL — ABNORMAL LOW (ref 0.30–0.70)
Heparin Unfractionated: <0.1 IU/mL — ABNORMAL LOW (ref 0.30–0.70)

## 2018-10-26 NOTE — Progress Notes (Signed)
Pulmonary Critical Care Medicine [place]   PULMONARY CRITICAL CARE SERVICE  PROGRESS NOTE  Date of Service: 10/26/2018  Auther Lyerly  FTD:[id number]  DOB: [birth year]   DOA: 10/21/2018  Referring Physician: Merton Border, MD  HPI: Benjamin Montoya is a 48 y.o. male seen for follow up of Acute on Chronic Respiratory Failure.  Right now on T collar off the ventilator patient is been on 40% FiO2  Medications: Reviewed on Rounds  Physical Exam:  Vitals: Temperature 99.4 pulse 60 respiratory 18 blood pressure 102/70 saturations 90%  Ventilator Settings currently off the ventilator on T collar FiO2 40%  . General: Comfortable at this time . Eyes: Grossly normal lids, irises & conjunctiva . ENT: grossly tongue is normal . Neck: no obvious mass . Cardiovascular: S1 S2 normal no gallop . Respiratory: No rhonchi no rales are noted at this time . Abdomen: soft . Skin: no rash seen on limited exam . Musculoskeletal: not rigid . Psychiatric:unable to assess . Neurologic: no seizure no involuntary movements         Lab Data:   Basic Metabolic Panel: Recent Labs  Lab 10/20/18 1246 10/21/18 0521 10/24/18 0155 10/25/18 0604  NA 137 135 140 142  K 3.7 3.7 3.3* 3.2*  CL 98 96* 105 105  CO2 31 27 25 26   GLUCOSE 106* 82 98 89  BUN 22* 17 12 12   CREATININE 0.33* 0.33* <0.30* 0.37*  CALCIUM 9.6 9.7 9.7 10.0    ABG: Recent Labs  Lab 10/23/18 0100  PHART 7.518*  PCO2ART 34.7  PO2ART 69.8*  HCO3 28.0  O2SAT 95.2    Liver Function Tests: Recent Labs  Lab 10/24/18 0155  AST 32  ALT 65*  ALKPHOS 55  BILITOT 0.5  PROT 6.5  ALBUMIN 3.0*   No results for input(s): LIPASE, AMYLASE in the last 168 hours. No results for input(s): AMMONIA in the last 168 hours.  CBC: Recent Labs  Lab 10/19/18 1945 10/20/18 1246 10/21/18 0521 10/22/18 1146 10/24/18 0155  WBC 15.4* 13.6* 12.7* 12.0* 13.7*  HGB 10.8* 9.9* 9.7* 9.5* 9.8*  HCT 36.3* 33.6* 32.3* 32.1*  32.6*  MCV 85.2 85.5 83.9 84.9 84.5  PLT 398 372 358 348 322    Cardiac Enzymes: No results for input(s): CKTOTAL, CKMB, CKMBINDEX, TROPONINI in the last 168 hours.  BNP (last 3 results) No results for input(s): BNP in the last 8760 hours.  ProBNP (last 3 results) No results for input(s): PROBNP in the last 8760 hours.  Radiological Exams: No results found.  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 Acute on chronic respiratory failure hypoxia continue with T collar during daytime rest on the ventilator at nighttime 2. Transverse myelitis grossly unchanged continue with supportive care. 3. Healthcare associated pneumonia treated clinically improving 4. Pulmonary embolism at baseline we will continue with supportive care   I have personally seen and evaluated the patient, evaluated laboratory and imaging results, formulated the assessment and plan and placed orders. The Patient requires high complexity decision making for assessment and support.  Case was discussed on Rounds with the Respiratory Therapy Staff  Allyne Gee, MD [facility] Pulmonary Critical Care Medicine Sleep Medicine

## 2018-10-27 DIAGNOSIS — J9621 Acute and chronic respiratory failure with hypoxia: Secondary | ICD-10-CM | POA: Diagnosis not present

## 2018-10-27 DIAGNOSIS — I2699 Other pulmonary embolism without acute cor pulmonale: Secondary | ICD-10-CM | POA: Diagnosis not present

## 2018-10-27 DIAGNOSIS — G373 Acute transverse myelitis in demyelinating disease of central nervous system: Secondary | ICD-10-CM | POA: Diagnosis not present

## 2018-10-27 DIAGNOSIS — J189 Pneumonia, unspecified organism: Secondary | ICD-10-CM | POA: Diagnosis not present

## 2018-10-27 LAB — MAGNESIUM: Magnesium: 1.9 mg/dL (ref 1.7–2.4)

## 2018-10-27 LAB — BASIC METABOLIC PANEL
Anion gap: 14 (ref 5–15)
BUN: 12 mg/dL (ref 6–20)
CO2: 23 mmol/L (ref 22–32)
Calcium: 9.8 mg/dL (ref 8.9–10.3)
Chloride: 104 mmol/L (ref 98–111)
Creatinine, Ser: <0.3 mg/dL — ABNORMAL LOW (ref 0.61–1.24)
Glucose, Bld: 89 mg/dL (ref 70–99)
Potassium: 3.3 mmol/L — ABNORMAL LOW (ref 3.5–5.1)
Sodium: 141 mmol/L (ref 135–145)

## 2018-10-27 LAB — PROTIME-INR
INR: 1.2 (ref 0.8–1.2)
Prothrombin Time: 15 seconds (ref 11.4–15.2)

## 2018-10-27 LAB — CBC
HCT: 35.4 % — ABNORMAL LOW (ref 39.0–52.0)
Hemoglobin: 10.4 g/dL — ABNORMAL LOW (ref 13.0–17.0)
MCH: 25.6 pg — ABNORMAL LOW (ref 26.0–34.0)
MCHC: 29.4 g/dL — ABNORMAL LOW (ref 30.0–36.0)
MCV: 87.2 fL (ref 80.0–100.0)
Platelets: 271 10*3/uL (ref 150–400)
RBC: 4.06 MIL/uL — ABNORMAL LOW (ref 4.22–5.81)
RDW: 17.1 % — ABNORMAL HIGH (ref 11.5–15.5)
WBC: 10.2 10*3/uL (ref 4.0–10.5)
nRBC: 0 % (ref 0.0–0.2)

## 2018-10-27 LAB — HEPARIN LEVEL (UNFRACTIONATED)
Heparin Unfractionated: 0.63 IU/mL (ref 0.30–0.70)
Heparin Unfractionated: 0.5 IU/mL (ref 0.30–0.70)
Heparin Unfractionated: 0.46 IU/mL (ref 0.30–0.70)

## 2018-10-27 NOTE — H&P (Signed)
[facility]  Benjamin MilletDavid Goldberger Aug 30, 1970  [id number].    Requesting MD: Dr. Sharyon MedicusHijazi Chief Complaint/Reason for Consult: Diverting colostomy for sacral wound   Patient is able to communicate through mouthing of words. He can shake his head yes and no and has some right arm movement. History is obtained by patient and chart review.   HPI: Patient is a 48 year old male who [facility] ICU. He was admitted there on 07/20/18 with transverse myelitis and treated with plasmapheresis, IVIG and steroids. Neurology workup for infectious, neoplastic and autoimmune causes of transverse myelitis were negative. Hospitalization was complicated by DVT/PE and respiratory failure. He is currently on Coumadin for DVT/PE's with most recent INR 1.5. He eventually required ECMO. His course was also complicated by Ventilatory associated Acinetobacter pneumonia which he was treated with antibiotics for. Per notes he also developed A. Fib with RVR. Patient underwent trach and PEG prior to placement at [facility]. He had a large sacral decubitus wound on admission to [facility]. Has had multiple debridements of wound by Dr. Georgiana ShoreLininger. He stool is currently being diverted by a rectal tube. Per Dr. Elyn AquasHijazi's note, "he continued to have rectal tube since admission due to the extent of his sacral decubitus ulcer and after discussions with our surgeon Dr. Georgiana ShoreLininger we decided to send him for consult for colostomy". He presents in the ED today for consultation. Neurology also to see per their note for a second opinion of his disease prognosis. Patient's mother present at bedside. Neuro seen while I was in the room. From my understanding, he does not believe the patient will regain rectal tone and could benefit from a colostomy.   ROS: Review of Systems  Unable to perform ROS: Patient nonverbal  Constitutional: Negative for chills and fever.   Gastrointestinal: Negative for abdominal pain.   All systems reviewed and otherwise negative except for as above  No family history on file.  Past Medical History:  Diagnosis Date  . Acute on chronic respiratory failure with hypoxia (HCC)   . 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 Healthcare-associated pneumonia   . Pulmonary embolism [facility](HCC)     Past Surgical History:  Procedure Laterality Date  . IR GASTROSTOMY TUBE MOD SED  09/13/2018  . LAPAROSCOPIC SIGMOID COLECTOMY N/A 10/21/2018   Procedure: LAPAROSCOPIC TRANSVERSE COLON LOOP COLOSTOMY;  Surgeon: Gaynelle AduWilson, Jamecia Lerman, MD;  [facility];  Service: General;  Laterality: N/A;  . TRACHEOSTOMY    . UMBILICAL HERNIA REPAIR N/A 10/21/2018   Procedure: Laparoscopic primary repair of incarcerated Umbilical Hernia;  Surgeon: Gaynelle AduWilson, Sencere Symonette, MD;  [facility];  Service: General;  Laterality: N/A;    Social History:  has no history on file for tobacco, alcohol, and drug.  Allergies: No Known Allergies  Medications Prior to Admission  Medication Sig Dispense Refill  . Amino Acids-Protein Hydrolys (PRO-STAT PO) Take 1 Package by mouth 3 (three) times daily. 30 ml    . ceFEPIme (MAXIPIME) 2 g injection Inject 2 g into the vein every 8 (eight) hours.    . clonazePAM (KLONOPIN) 0.5 MG tablet Take 0.5 mg by mouth 2 (two) times daily.    Marland Kitchen. FLUoxetine (PROZAC) 40 MG capsule Take 40 mg by mouth daily.    Marland Kitchen. gabapentin (NEURONTIN) 400 MG capsule Take 1,200 mg by mouth 3 (three) times daily.    . Guar Gum (NUTRISOURCE FIBER) PACK  Take 1 Package by mouth 4 (four) times daily.    . magnesium oxide (MAG-OX) 400 MG tablet Take 400 mg by mouth 2 (two) times daily.    . metoCLOPramide (REGLAN) 5 MG/5ML solution Take 2.5 mg by mouth See admin instructions. Given at 0600 and 2200    . metoCLOPramide (REGLAN) 5 MG/5ML solution Take 10 mg by mouth See admin instructions. 1230    . midodrine (PROAMATINE) 5 MG  tablet Take 15 mg by mouth 3 (three) times daily with meals.    . mirtazapine (REMERON) 15 MG tablet Take 15 mg by mouth at bedtime.    . Multiple Vitamins-Minerals (MULTIVITAMINS THER. W/MINERALS) TABS tablet Take 1 tablet by mouth daily.    . Oxycodone HCl 10 MG TABS Take 10 mg by mouth every 6 (six) hours.    . pantoprazole (PROTONIX) 40 MG tablet Take 40 mg by mouth daily.    . predniSONE (DELTASONE) 20 MG tablet Take 40 mg by mouth daily with breakfast.    . Probiotic Product (DIGESTIVE ADVANTAGE) CAPS Take 1 capsule by mouth daily.    . vitamin B-12 (CYANOCOBALAMIN) 1000 MCG tablet Take 1,000 mcg by mouth daily.    Marland Kitchen warfarin (COUMADIN) 7.5 MG tablet Take 7.5 mg by mouth daily at 6 PM.      Prior to Admission medications   Not on File      There were no vitals taken for this visit. Physical Exam: General: ill appearing, obese male lying in bed  HEENT: head is normocephalic, atraumatic.  Sclera are noninjected.  Pupils equal and round.  Ears and nose without any masses or lesions.  Mouth is pink and moist. Dentition fair.  Neck: Trach collar  Heart: Tachycardic.  Lungs: Distant breath sounds throughout. Rales at bases. Tachypneic  Abd: Protuberant, soft, NT/ND, +BS, no masses, hernias, or organomegaly MS: Some movement of the right arm. No movement of the lower extremities. B/l LE edema  GU: Chaperone present - Foley present. Rectal tube present. Sacral wound with wound vac in place.  Skin: Warm and dry. No rash.  Psych: Alert to name, place, situation and time.  Neuro: I was present during Dr. Yvetta Coder exam. Some movement of right arm. No movement of lower extremities. Flacid reflexes LE's. Hyper-reflexive UE's.   Results for orders placed or performed during the hospital encounter of 10/21/18 (from the past 48 hour(s))  Protime-INR     Status: None   Collection Time: 10/26/18  6:58 AM  Result Value Ref Range   Prothrombin Time 14.1 11.4 - 15.2 seconds   INR 1.1 0.8 - 1.2     Comment: (NOTE) INR goal varies based on device and disease states. Performed at [facility], [place] [address], [place], [place] [address]   Heparin level (unfractionated)     Status: Abnormal   Collection Time: 10/26/18  6:58 AM  Result Value Ref Range   Heparin Unfractionated 0.13 (L) 0.30 - 0.70 IU/mL    Comment: (NOTE) If heparin results are below expected values, and patient dosage has  been confirmed, suggest follow up testing of antithrombin III levels. Performed at [facility], [place] [address] [place], [place] [address]   Heparin level (unfractionated)     Status: Abnormal   Collection Time: 10/26/18  6:38 PM  Result Value Ref Range   Heparin Unfractionated <0.10 (L) 0.30 - 0.70 IU/mL    Comment: REPEATED TO VERIFY (NOTE) If heparin results are below expected values, and patient  dosage has  been confirmed, suggest follow up testing of antithrombin III levels. Performed at [facility], [place] [address] [place], [place] [address]   Protime-INR     Status: None   Collection Time: 10/27/18  3:18 AM  Result Value Ref Range   Prothrombin Time 15.0 11.4 - 15.2 seconds   INR 1.2 0.8 - 1.2    Comment: (NOTE) INR goal varies based on device and disease states. Performed at [facility], [place] [address] [place], [place] [address]   Basic metabolic panel     Status: Abnormal   Collection Time: 10/27/18  3:18 AM  Result Value Ref Range   Sodium 141 135 - 145 mmol/L   Potassium 3.3 (L) 3.5 - 5.1 mmol/L   Chloride 104 98 - 111 mmol/L   CO2 23 22 - 32 mmol/L   Glucose, Bld 89 70 - 99 mg/dL   BUN 12 6 - 20 mg/dL   Creatinine, Ser <0.30 (L) 0.61 - 1.24 mg/dL   Calcium 9.8 8.9 - 10.3 mg/dL   GFR calc non Af Amer NOT CALCULATED >60 mL/min   GFR calc Af Amer NOT CALCULATED >60 mL/min   Anion gap 14 5 - 15    Comment: Performed at [facility], [place] [address] [place], [place] [address]  CBC     Status: Abnormal   Collection Time: 10/27/18  3:18 AM   Result Value Ref Range   WBC 10.2 4.0 - 10.5 K/uL   RBC 4.06 (L) 4.22 - 5.81 MIL/uL   Hemoglobin 10.4 (L) 13.0 - 17.0 g/dL   HCT 35.4 (L) 39.0 - 52.0 %   MCV 87.2 80.0 - 100.0 fL   MCH 25.6 (L) 26.0 - 34.0 pg   MCHC 29.4 (L) 30.0 - 36.0 g/dL   RDW 17.1 (H) 11.5 - 15.5 %   Platelets 271 150 - 400 K/uL   nRBC 0.0 0.0 - 0.2 %    Comment: Performed at [facility], [place] [address], [place], [place] [address]  Magnesium     Status: None   Collection Time: 10/27/18  3:18 AM  Result Value Ref Range   Magnesium 1.9 1.7 - 2.4 mg/dL    Comment: Performed at [place] [address], [place], [place] [address]  Heparin level (unfractionated)     Status: None   Collection Time: 10/27/18  3:18 AM  Result Value Ref Range   Heparin Unfractionated 0.63 0.30 - 0.70 IU/mL    Comment: (NOTE) If heparin results are below expected values, and patient dosage has  been confirmed, suggest follow up testing of antithrombin III levels. Performed at [facility], [place] [address] [place], [place] [address]   Heparin level (unfractionated)     Status: None   Collection Time: 10/27/18 10:22 AM  Result Value Ref Range   Heparin Unfractionated 0.50 0.30 - 0.70 IU/mL    Comment: (NOTE) If heparin results are below expected values, and patient dosage has  been confirmed, suggest follow up testing of antithrombin III levels. Performed at [facility], [place] [address], [place], [place] [address]    No results found.    Assessment/Plan This is a 47 year who we were consulted for possible colostomy creation.  He has had a complicated course as above.  Per neurology note chances of meaningful recovery of sphincter tone is poor.  Recommend end colostomy. Risks and benefits discussed with patient and  mother are in agreement with this. Will tentatively plan to do this on Thursday, 10/21/2018. Will check pre-albumin. Dr. Andrey CampanileWilson discussed with Dr. Sharyon MedicusHijazi who will convert the patient from  Coumadin to IV Heparin. [facility].   Laverta BaltimoreEric Bless Lisenby,MD [facility] 10/27/2018, 2:03 PM Pager: [phone]

## 2018-10-27 NOTE — Progress Notes (Addendum)
Pulmonary Critical Care Medicine [place]   PULMONARY CRITICAL CARE SERVICE  PROGRESS NOTE  Date of Service: 10/27/2018  Benjamin Montoya  HGD:[id number]  DOB: 13-Aug-1970   DOA: 10/21/2018  Referring Physician: Merton Border, MD  HPI: Benjamin Montoya is a 48 y.o. male seen for follow up of Acute on Chronic Respiratory Failure.  Patient remains on aerosol trach collar 35% using PMV during the day and resting on full support on the vent at night.  Medications: Reviewed on Rounds  Physical Exam:  Vitals: Pulse 64 respirations 18 BP 127/81 O2 sat 97% temp 98.4  Ventilator Settings ATC 35%  . General: Comfortable at this time . Eyes: Grossly normal lids, irises & conjunctiva . ENT: grossly tongue is normal . Neck: no obvious mass . Cardiovascular: S1 S2 normal no gallop . Respiratory: No rales or rhonchi noted . Abdomen: soft . Skin: no rash seen on limited exam . Musculoskeletal: not rigid . Psychiatric:unable to assess . Neurologic: no seizure no involuntary movements         Lab Data:   Basic Metabolic Panel: Recent Labs  Lab 10/21/18 0521 10/24/18 0155 10/25/18 0604 10/27/18 0318  NA 135 140 142 141  K 3.7 3.3* 3.2* 3.3*  CL 96* 105 105 104  CO2 27 25 26 23   GLUCOSE 82 98 89 89  BUN 17 12 12 12   CREATININE 0.33* <0.30* 0.37* <0.30*  CALCIUM 9.7 9.7 10.0 9.8  MG  --   --   --  1.9    ABG: Recent Labs  Lab 10/23/18 0100  PHART 7.518*  PCO2ART 34.7  PO2ART 69.8*  HCO3 28.0  O2SAT 95.2    Liver Function Tests: Recent Labs  Lab 10/24/18 0155  AST 32  ALT 65*  ALKPHOS 55  BILITOT 0.5  PROT 6.5  ALBUMIN 3.0*   No results for input(s): LIPASE, AMYLASE in the last 168 hours. No results for input(s): AMMONIA in the last 168 hours.  CBC: Recent Labs  Lab 10/21/18 0521 10/22/18 1146 10/24/18 0155 10/27/18 0318  WBC 12.7* 12.0* 13.7* 10.2  HGB 9.7* 9.5* 9.8* 10.4*  HCT 32.3* 32.1* 32.6* 35.4*  MCV 83.9 84.9 84.5 87.2  PLT 358  348 322 271    Cardiac Enzymes: No results for input(s): CKTOTAL, CKMB, CKMBINDEX, TROPONINI in the last 168 hours.  BNP (last 3 results) No results for input(s): BNP in the last 8760 hours.  ProBNP (last 3 results) No results for input(s): PROBNP in the last 8760 hours.  Radiological Exams: No results found.  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 Acute on chronic respiratory failure hypoxia continue with T collar during daytime rest on the ventilator at nighttime 2. Transverse myelitis grossly unchanged continue with supportive care. 3. Healthcare associated pneumonia treated clinically improving 4. Pulmonary embolism at baseline we will continue with supportive care   I have personally seen and evaluated the patient, evaluated laboratory and imaging results, formulated the assessment and plan and placed orders. The Patient requires high complexity decision making for assessment and support.  Case was discussed on Rounds with the Respiratory Therapy Staff  Allyne Gee, MD [facility] Pulmonary Critical Care Medicine Sleep Medicine

## 2018-10-27 NOTE — Consult Note (Signed)
[facility]  Benjamin MilletDavid Ishler 1970-08-26  [id number].    Requesting MD: Dr. Sharyon MedicusHijazi Chief Complaint/Reason for Consult: Diverting colostomy for sacral wound   Patient is able to communicate through mouthing of words. He can shake his head yes and no and has some right arm movement. History is obtained by patient and chart review.   HPI: Patient is a 48 year old male who was [facility]. He was admitted there on 07/20/18 with transverse myelitis and treated with plasmapheresis, IVIG and steroids. Neurology workup for infectious, neoplastic and autoimmune causes of transverse myelitis were negative. Hospitalization was complicated by DVT/PE and respiratory failure. He is currently on Coumadin for DVT/PE's with most recent INR 1.5. He eventually required ECMO. His course was also complicated by Ventilatory associated Acinetobacter pneumonia which he was treated with antibiotics for. Per notes he also developed A. Fib with RVR. Patient underwent trach and PEG prior to placement at [facility]. He had a large sacral decubitus wound on admission to [facility]. Has had multiple debridements of wound by Dr. Georgiana ShoreLininger. He stool is currently being diverted by a rectal tube. Per Dr. Elyn AquasHijazi's note, "he continued to have rectal tube since admission due to the extent of his sacral decubitus ulcer and after discussions with our surgeon Dr. Georgiana ShoreLininger we decided to send him for consult for colostomy". He presents in the ED today for consultation. Neurology also to see per their note for a second opinion of his disease prognosis. Patient's mother present at bedside. Neuro seen while I was in the room. From my understanding, he does not believe the patient will regain rectal tone and could benefit from a colostomy.   ROS: Review of Systems  Unable to perform ROS: Patient nonverbal  Constitutional: Negative for chills and fever.   Gastrointestinal: Negative for abdominal pain.   All systems reviewed and otherwise negative except for as above  No family history on file.  Past Medical History:  Diagnosis Date  . Acute on chronic respiratory failure with hypoxia (HCC)   . 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 Healthcare-associated pneumonia   . Pulmonary embolism [facility](HCC)     Past Surgical History:  Procedure Laterality Date  . IR GASTROSTOMY TUBE MOD SED  09/13/2018  . LAPAROSCOPIC SIGMOID COLECTOMY N/A 10/21/2018   Procedure: LAPAROSCOPIC TRANSVERSE COLON LOOP COLOSTOMY;  Surgeon: Gaynelle AduWilson, Nayara Taplin, MD;  [facility];  Service: General;  Laterality: N/A;  . TRACHEOSTOMY    . UMBILICAL HERNIA REPAIR N/A 10/21/2018   Procedure: Laparoscopic primary repair of incarcerated Umbilical Hernia;  Surgeon: Gaynelle AduWilson, Ellison Rieth, MD;  [facility];  Service: General;  Laterality: N/A;    Social History:  has no history on file for tobacco, alcohol, and drug.  Allergies: No Known Allergies  Medications Prior to Admission  Medication Sig Dispense Refill  . Amino Acids-Protein Hydrolys (PRO-STAT PO) Take 1 Package by mouth 3 (three) times daily. 30 ml    . ceFEPIme (MAXIPIME) 2 g injection Inject 2 g into the vein every 8 (eight) hours.    . clonazePAM (KLONOPIN) 0.5 MG tablet Take 0.5 mg by mouth 2 (two) times daily.    Marland Kitchen. FLUoxetine (PROZAC) 40 MG capsule Take 40 mg by mouth daily.    Marland Kitchen. gabapentin (NEURONTIN) 400 MG capsule Take 1,200 mg by mouth 3 (three) times daily.    . Guar Gum (NUTRISOURCE FIBER) PACK  Take 1 Package by mouth 4 (four) times daily.    . magnesium oxide (MAG-OX) 400 MG tablet Take 400 mg by mouth 2 (two) times daily.    . metoCLOPramide (REGLAN) 5 MG/5ML solution Take 2.5 mg by mouth See admin instructions. Given at 0600 and 2200    . metoCLOPramide (REGLAN) 5 MG/5ML solution Take 10 mg by mouth See admin instructions. 1230    . midodrine (PROAMATINE) 5 MG  tablet Take 15 mg by mouth 3 (three) times daily with meals.    . mirtazapine (REMERON) 15 MG tablet Take 15 mg by mouth at bedtime.    . Multiple Vitamins-Minerals (MULTIVITAMINS THER. W/MINERALS) TABS tablet Take 1 tablet by mouth daily.    . Oxycodone HCl 10 MG TABS Take 10 mg by mouth every 6 (six) hours.    . pantoprazole (PROTONIX) 40 MG tablet Take 40 mg by mouth daily.    . predniSONE (DELTASONE) 20 MG tablet Take 40 mg by mouth daily with breakfast.    . Probiotic Product (DIGESTIVE ADVANTAGE) CAPS Take 1 capsule by mouth daily.    . vitamin B-12 (CYANOCOBALAMIN) 1000 MCG tablet Take 1,000 mcg by mouth daily.    Marland Kitchen warfarin (COUMADIN) 7.5 MG tablet Take 7.5 mg by mouth daily at 6 PM.      Prior to Admission medications   Not on File      There were no vitals taken for this visit. Physical Exam: General: ill appearing, obese male lying in bed  HEENT: head is normocephalic, atraumatic.  Sclera are noninjected.  Pupils equal and round.  Ears and nose without any masses or lesions.  Mouth is pink and moist. Dentition fair.  Neck: Trach collar  Heart: Tachycardic.  Lungs: Distant breath sounds throughout. Rales at bases. Tachypneic  Abd: Protuberant, soft, NT/ND, +BS, no masses, hernias, or organomegaly MS: Some movement of the right arm. No movement of the lower extremities. B/l LE edema  GU: Chaperone present - Foley present. Rectal tube present. Sacral wound with wound vac in place.  Skin: Warm and dry. No rash.  Psych: Alert to name, place, situation and time.  Neuro: I was present during Dr. Yvetta Coder exam. Some movement of right arm. No movement of lower extremities. Flacid reflexes LE's. Hyper-reflexive UE's.   Results for orders placed or performed during the hospital encounter of 10/21/18 (from the past 48 hour(s))  Protime-INR     Status: None   Collection Time: 10/26/18  6:58 AM  Result Value Ref Range   Prothrombin Time 14.1 11.4 - 15.2 seconds   INR 1.1 0.8 - 1.2     Comment: (NOTE) INR goal varies based on device and disease states. Performed at [facility], [place] [address], [place], [place] [address]   Heparin level (unfractionated)     Status: Abnormal   Collection Time: 10/26/18  6:58 AM  Result Value Ref Range   Heparin Unfractionated 0.13 (L) 0.30 - 0.70 IU/mL    Comment: (NOTE) If heparin results are below expected values, and patient dosage has  been confirmed, suggest follow up testing of antithrombin III levels. Performed at [facility], [place] [address] [place], [place] [address]   Heparin level (unfractionated)     Status: Abnormal   Collection Time: 10/26/18  6:38 PM  Result Value Ref Range   Heparin Unfractionated <0.10 (L) 0.30 - 0.70 IU/mL    Comment: REPEATED TO VERIFY (NOTE) If heparin results are below expected values, and patient  dosage has  been confirmed, suggest follow up testing of antithrombin III levels. Performed at [facility], [place] [address] [place], [place] [address]   Protime-INR     Status: None   Collection Time: 10/27/18  3:18 AM  Result Value Ref Range   Prothrombin Time 15.0 11.4 - 15.2 seconds   INR 1.2 0.8 - 1.2    Comment: (NOTE) INR goal varies based on device and disease states. Performed at [facility], [place] [address] [place], [place] [address]   Basic metabolic panel     Status: Abnormal   Collection Time: 10/27/18  3:18 AM  Result Value Ref Range   Sodium 141 135 - 145 mmol/L   Potassium 3.3 (L) 3.5 - 5.1 mmol/L   Chloride 104 98 - 111 mmol/L   CO2 23 22 - 32 mmol/L   Glucose, Bld 89 70 - 99 mg/dL   BUN 12 6 - 20 mg/dL   Creatinine, Ser <0.30 (L) 0.61 - 1.24 mg/dL   Calcium 9.8 8.9 - 10.3 mg/dL   GFR calc non Af Amer NOT CALCULATED >60 mL/min   GFR calc Af Amer NOT CALCULATED >60 mL/min   Anion gap 14 5 - 15    Comment: Performed at [facility], [place] [address] [place], [place] [address]  CBC     Status: Abnormal   Collection Time: 10/27/18  3:18 AM   Result Value Ref Range   WBC 10.2 4.0 - 10.5 K/uL   RBC 4.06 (L) 4.22 - 5.81 MIL/uL   Hemoglobin 10.4 (L) 13.0 - 17.0 g/dL   HCT 35.4 (L) 39.0 - 52.0 %   MCV 87.2 80.0 - 100.0 fL   MCH 25.6 (L) 26.0 - 34.0 pg   MCHC 29.4 (L) 30.0 - 36.0 g/dL   RDW 17.1 (H) 11.5 - 15.5 %   Platelets 271 150 - 400 K/uL   nRBC 0.0 0.0 - 0.2 %    Comment: Performed at [facility], [place] [address], [place], [place] [address]  Magnesium     Status: None   Collection Time: 10/27/18  3:18 AM  Result Value Ref Range   Magnesium 1.9 1.7 - 2.4 mg/dL    Comment: Performed at [place] [address], [place], [place] [address]  Heparin level (unfractionated)     Status: None   Collection Time: 10/27/18  3:18 AM  Result Value Ref Range   Heparin Unfractionated 0.63 0.30 - 0.70 IU/mL    Comment: (NOTE) If heparin results are below expected values, and patient dosage has  been confirmed, suggest follow up testing of antithrombin III levels. Performed at [facility], [place] [address] [place], [place] [address]   Heparin level (unfractionated)     Status: None   Collection Time: 10/27/18 10:22 AM  Result Value Ref Range   Heparin Unfractionated 0.50 0.30 - 0.70 IU/mL    Comment: (NOTE) If heparin results are below expected values, and patient dosage has  been confirmed, suggest follow up testing of antithrombin III levels. Performed at [facility], [place] [address], [place], [place] [address]    No results found.    Assessment/Plan This is a 47 year who we were consulted for possible colostomy creation.  He has had a complicated course as above.  Per neurology note chances of meaningful recovery of sphincter tone is poor.  Recommend end colostomy. Risks and benefits discussed with patient and  mother are in agreement with this. Will tentatively plan to do this on Thursday, 10/21/2018. Will check pre-albumin. Dr. Andrey CampanileWilson discussed with Dr. Sharyon MedicusHijazi who will convert the patient from  Coumadin to IV Heparin. [facility]   Gaynelle AduEric Sherrise Liberto, [facility] 10/27/2018, 2:03 PM Pager: [phone]

## 2018-10-28 DIAGNOSIS — G373 Acute transverse myelitis in demyelinating disease of central nervous system: Secondary | ICD-10-CM | POA: Diagnosis not present

## 2018-10-28 DIAGNOSIS — J189 Pneumonia, unspecified organism: Secondary | ICD-10-CM | POA: Diagnosis not present

## 2018-10-28 DIAGNOSIS — I2699 Other pulmonary embolism without acute cor pulmonale: Secondary | ICD-10-CM | POA: Diagnosis not present

## 2018-10-28 DIAGNOSIS — J9621 Acute and chronic respiratory failure with hypoxia: Secondary | ICD-10-CM | POA: Diagnosis not present

## 2018-10-28 LAB — HEPARIN LEVEL (UNFRACTIONATED)
Heparin Unfractionated: 0.15 IU/mL — ABNORMAL LOW (ref 0.30–0.70)
Heparin Unfractionated: 0.32 IU/mL (ref 0.30–0.70)
Heparin Unfractionated: <0.1 IU/mL — ABNORMAL LOW (ref 0.30–0.70)

## 2018-10-28 LAB — PROTIME-INR
INR: 1.2 (ref 0.8–1.2)
Prothrombin Time: 15.1 seconds (ref 11.4–15.2)

## 2018-10-28 NOTE — Progress Notes (Addendum)
Pulmonary Critical Care Medicine [name]   PULMONARY CRITICAL CARE SERVICE  PROGRESS NOTE  Date of Service: 10/28/2018  Benjamin Montoya  [id number]  DOB: 1971-02-01   DOA: 10/21/2018  Referring Physician: Merton Border, MD  HPI: Benjamin Montoya is a 48 y.o. male seen for follow up of Acute on Chronic Respiratory Failure.  Patient continues on aerosol trach collar 35% FiO2 during the day and resting on the vent at night satting well with no fever or distress at this time.  Medications: Reviewed on Rounds  Physical Exam:  Vitals: 73 respirations 18 BP 10 over 72 O2 sat 97% temp 98.2  Ventilator Settings ATC 35%  . General: Comfortable at this time . Eyes: Grossly normal lids, irises & conjunctiva . ENT: grossly tongue is normal . Neck: no obvious mass . Cardiovascular: S1 S2 normal no gallop . Respiratory: No rales or rhonchi noted . Abdomen: soft . Skin: no rash seen on limited exam . Musculoskeletal: not rigid . Psychiatric:unable to assess . Neurologic: no seizure no involuntary movements         Lab Data:   Basic Metabolic Panel: Recent Labs  Lab 10/24/18 0155 10/25/18 0604 10/27/18 0318  NA 140 142 141  K 3.3* 3.2* 3.3*  CL 105 105 104  CO2 25 26 23   GLUCOSE 98 89 89  BUN 12 12 12   CREATININE <0.30* 0.37* <0.30*  CALCIUM 9.7 10.0 9.8  MG  --   --  1.9    ABG: Recent Labs  Lab 10/23/18 0100  PHART 7.518*  PCO2ART 34.7  PO2ART 69.8*  HCO3 28.0  O2SAT 95.2    Liver Function Tests: Recent Labs  Lab 10/24/18 0155  AST 32  ALT 65*  ALKPHOS 55  BILITOT 0.5  PROT 6.5  ALBUMIN 3.0*   No results for input(s): LIPASE, AMYLASE in the last 168 hours. No results for input(s): AMMONIA in the last 168 hours.  CBC: Recent Labs  Lab 10/22/18 1146 10/24/18 0155 10/27/18 0318  WBC 12.0* 13.7* 10.2  HGB 9.5* 9.8* 10.4*  HCT 32.1* 32.6* 35.4*  MCV 84.9 84.5 87.2  PLT 348 322 271    Cardiac Enzymes: No results for input(s):  CKTOTAL, CKMB, CKMBINDEX, TROPONINI in the last 168 hours.  BNP (last 3 results) No results for input(s): BNP in the last 8760 hours.  ProBNP (last 3 results) No results for input(s): PROBNP in the last 8760 hours.  Radiological Exams: No results found.  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 Acute on chronic respiratory failure hypoxia continue with T collar during daytime rest on the ventilator at nighttime 2. Transverse myelitis grossly unchanged continue with supportive care. 3. Healthcare associated pneumonia treated clinically improving 4. Pulmonary embolism at baseline we will continue with supportive care   I have personally seen and evaluated the patient, evaluated laboratory and imaging results, formulated the assessment and plan and placed orders. The Patient requires high complexity decision making for assessment and support.  Case was discussed on Rounds with the Respiratory Therapy Staff  Allyne Gee, MD [facility] Pulmonary Critical Care Medicine Sleep Medicine

## 2018-10-29 DIAGNOSIS — J9621 Acute and chronic respiratory failure with hypoxia: Secondary | ICD-10-CM | POA: Diagnosis not present

## 2018-10-29 DIAGNOSIS — J189 Pneumonia, unspecified organism: Secondary | ICD-10-CM | POA: Diagnosis not present

## 2018-10-29 DIAGNOSIS — I2699 Other pulmonary embolism without acute cor pulmonale: Secondary | ICD-10-CM | POA: Diagnosis not present

## 2018-10-29 DIAGNOSIS — G373 Acute transverse myelitis in demyelinating disease of central nervous system: Secondary | ICD-10-CM | POA: Diagnosis not present

## 2018-10-29 LAB — HEPARIN LEVEL (UNFRACTIONATED)
Heparin Unfractionated: 0.17 IU/mL — ABNORMAL LOW (ref 0.30–0.70)
Heparin Unfractionated: <0.1 IU/mL — ABNORMAL LOW (ref 0.30–0.70)

## 2018-10-29 LAB — PROTIME-INR
INR: 1.1 (ref 0.8–1.2)
Prothrombin Time: 14.3 seconds (ref 11.4–15.2)

## 2018-10-29 NOTE — Progress Notes (Addendum)
Pulmonary Critical Care Medicine [place]   PULMONARY CRITICAL CARE SERVICE  PROGRESS NOTE  Date of Service: 10/29/2018  Benjamin Montoya  [id number]  DOB: [birth year]   DOA: 10/21/2018  Referring Physician: Merton Border, MD  HPI: Benjamin Montoya is a 48 y.o. male seen for follow up of Acute on Chronic Respiratory Failure.  Patient remains on aerosol trach 35% FiO2 satting well with no distress.  Patient rest on ventilator at night.  Using PMV today with no issues.  Med continues toications: Reviewed on Rounds  Physical Exam:  Vitals: Pulse 88 respirations 18 BP 134/65 O2 95% temp 99.4  Ventilator Settings ATC 35%  . General: Comfortable at this time . Eyes: Grossly normal lids, irises & conjunctiva . ENT: grossly tongue is normal . Neck: no obvious mass . Cardiovascular: S1 S2 normal no gallop . Respiratory: No rales or rhonchi noted . Abdomen: soft . Skin: no rash seen on limited exam . Musculoskeletal: not rigid . Psychiatric:unable to assess . Neurologic: no seizure no involuntary movements         Lab Data:   Basic Metabolic Panel: Recent Labs  Lab 10/24/18 0155 10/25/18 0604 10/27/18 0318  NA 140 142 141  K 3.3* 3.2* 3.3*  CL 105 105 104  CO2 25 26 23   GLUCOSE 98 89 89  BUN 12 12 12   CREATININE <0.30* 0.37* <0.30*  CALCIUM 9.7 10.0 9.8  MG  --   --  1.9    ABG: Recent Labs  Lab 10/23/18 0100  PHART 7.518*  PCO2ART 34.7  PO2ART 69.8*  HCO3 28.0  O2SAT 95.2    Liver Function Tests: Recent Labs  Lab 10/24/18 0155  AST 32  ALT 65*  ALKPHOS 55  BILITOT 0.5  PROT 6.5  ALBUMIN 3.0*   No results for input(s): LIPASE, AMYLASE in the last 168 hours. No results for input(s): AMMONIA in the last 168 hours.  CBC: Recent Labs  Lab 10/24/18 0155 10/27/18 0318  WBC 13.7* 10.2  HGB 9.8* 10.4*  HCT 32.6* 35.4*  MCV 84.5 87.2  PLT 322 271    Cardiac Enzymes: No results for input(s): CKTOTAL, CKMB, CKMBINDEX, TROPONINI  in the last 168 hours.  BNP (last 3 results) No results for input(s): BNP in the last 8760 hours.  ProBNP (last 3 results) No results for input(s): PROBNP in the last 8760 hours.  Radiological Exams: No results found.  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 Acute on chronic respiratory failure hypoxia continue with T collar during daytime rest on the ventilator at nighttime 2. Transverse myelitis grossly unchanged continue with supportive care. 3. Healthcare associated pneumonia treated clinically improving 4. Pulmonary embolism at baseline we will continue with supportive care.   I have personally seen and evaluated the patient, evaluated laboratory and imaging results, formulated the assessment and plan and placed orders. The Patient requires high complexity decision making for assessment and support.  Case was discussed on Rounds with the Respiratory Therapy Staff  Allyne Gee, MD [facility] Pulmonary Critical Care Medicine Sleep Medicine

## 2018-10-30 DIAGNOSIS — J189 Pneumonia, unspecified organism: Secondary | ICD-10-CM | POA: Diagnosis not present

## 2018-10-30 DIAGNOSIS — J9621 Acute and chronic respiratory failure with hypoxia: Secondary | ICD-10-CM | POA: Diagnosis not present

## 2018-10-30 DIAGNOSIS — I2699 Other pulmonary embolism without acute cor pulmonale: Secondary | ICD-10-CM | POA: Diagnosis not present

## 2018-10-30 DIAGNOSIS — G373 Acute transverse myelitis in demyelinating disease of central nervous system: Secondary | ICD-10-CM | POA: Diagnosis not present

## 2018-10-30 LAB — HEPARIN LEVEL (UNFRACTIONATED)
Heparin Unfractionated: 1.01 IU/mL — ABNORMAL HIGH (ref 0.30–0.70)
Heparin Unfractionated: 0.64 IU/mL (ref 0.30–0.70)
Heparin Unfractionated: 0.26 IU/mL — ABNORMAL LOW (ref 0.30–0.70)
Heparin Unfractionated: 0.26 IU/mL — ABNORMAL LOW (ref 0.30–0.70)

## 2018-10-30 LAB — PROTIME-INR
INR: 1.4 — ABNORMAL HIGH (ref 0.8–1.2)
Prothrombin Time: 17.1 seconds — ABNORMAL HIGH (ref 11.4–15.2)

## 2018-10-30 NOTE — Progress Notes (Addendum)
Pulmonary Critical Care Medicine [place]   PULMONARY CRITICAL CARE SERVICE  PROGRESS NOTE  Date of Service: 10/30/2018  Benjamin Montoya  [id number]  DOB: 24-Apr-1970   DOA: 10/21/2018  Referring Physician: Merton Border, MD  HPI: Benjamin Montoya is a 48 y.o. male seen for follow up of Acute on Chronic Respiratory Failure.  Patient remains on a trach collar 35% FiO2 during the day and rest on the vent at night.  Currently doing well with no distress.  Medications: Reviewed on Rounds  Physical Exam:  Vitals: Pulse 68 respirations 18 BP 127/61 O2 sat 98% temp 97.0  Ventilator Settings ATC 35%  . General: Comfortable at this time . Eyes: Grossly normal lids, irises & conjunctiva . ENT: grossly tongue is normal . Neck: no obvious mass . Cardiovascular: S1 S2 normal no gallop . Respiratory: No rales or rhonchi noted . Abdomen: soft . Skin: no rash seen on limited exam . Musculoskeletal: not rigid . Psychiatric:unable to assess . Neurologic: no seizure no involuntary movements         Lab Data:   Basic Metabolic Panel: Recent Labs  Lab 10/24/18 0155 10/25/18 0604 10/27/18 0318  NA 140 142 141  K 3.3* 3.2* 3.3*  CL 105 105 104  CO2 25 26 23   GLUCOSE 98 89 89  BUN 12 12 12   CREATININE <0.30* 0.37* <0.30*  CALCIUM 9.7 10.0 9.8  MG  --   --  1.9    ABG: No results for input(s): PHART, PCO2ART, PO2ART, HCO3, O2SAT in the last 168 hours.  Liver Function Tests: Recent Labs  Lab 10/24/18 0155  AST 32  ALT 65*  ALKPHOS 55  BILITOT 0.5  PROT 6.5  ALBUMIN 3.0*   No results for input(s): LIPASE, AMYLASE in the last 168 hours. No results for input(s): AMMONIA in the last 168 hours.  CBC: Recent Labs  Lab 10/24/18 0155 10/27/18 0318  WBC 13.7* 10.2  HGB 9.8* 10.4*  HCT 32.6* 35.4*  MCV 84.5 87.2  PLT 322 271    Cardiac Enzymes: No results for input(s): CKTOTAL, CKMB, CKMBINDEX, TROPONINI in the last 168 hours.  BNP (last 3  results) No results for input(s): BNP in the last 8760 hours.  ProBNP (last 3 results) No results for input(s): PROBNP in the last 8760 hours.  Radiological Exams: No results found.  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 Acute on chronic respiratory failure hypoxia continue with T collar during daytime rest on the ventilator at nighttime 2. Transverse myelitis grossly unchanged continue with supportive care. 3. Healthcare associated pneumonia treated clinically improving 4. Pulmonary embolism at baseline we will continue with supportive care.   I have personally seen and evaluated the patient, evaluated laboratory and imaging results, formulated the assessment and plan and placed orders. The Patient requires high complexity decision making for assessment and support.  Case was discussed on Rounds with the Respiratory Therapy Staff  Allyne Gee, MD [facility] Pulmonary Critical Care Medicine Sleep Medicine

## 2018-10-31 DIAGNOSIS — I2699 Other pulmonary embolism without acute cor pulmonale: Secondary | ICD-10-CM | POA: Diagnosis not present

## 2018-10-31 DIAGNOSIS — J189 Pneumonia, unspecified organism: Secondary | ICD-10-CM | POA: Diagnosis not present

## 2018-10-31 DIAGNOSIS — G373 Acute transverse myelitis in demyelinating disease of central nervous system: Secondary | ICD-10-CM | POA: Diagnosis not present

## 2018-10-31 DIAGNOSIS — J9621 Acute and chronic respiratory failure with hypoxia: Secondary | ICD-10-CM | POA: Diagnosis not present

## 2018-10-31 LAB — PROTIME-INR
INR: 1.7 — ABNORMAL HIGH (ref 0.8–1.2)
Prothrombin Time: 20 seconds — ABNORMAL HIGH (ref 11.4–15.2)

## 2018-10-31 NOTE — Progress Notes (Addendum)
Pulmonary Critical Care Medicine [place]   PULMONARY CRITICAL CARE SERVICE  PROGRESS NOTE  Date of Service: 10/31/2018  Benjamin Montoya  [id number]  DOB: Jun 26, 1970   DOA: 10/21/2018  Referring Physician: Merton Border, MD  HPI: Benjamin Montoya is a 48 y.o. male seen for follow up of Acute on Chronic Respiratory Failure.  Patient continue aerosol trach collar 35% FiO2 during the day using PMV with no difficulty.  Still on the ventilator at night.  Medications: Reviewed on Rounds  Physical Exam:  Vitals: Pulse 58 respirations 18 BP 107/61 O2 sat 95% temp 95.1  Ventilator Settings ATC 35%  . General: Comfortable at this time . Eyes: Grossly normal lids, irises & conjunctiva . ENT: grossly tongue is normal . Neck: no obvious mass . Cardiovascular: S1 S2 normal no gallop . Respiratory: No rales or rhonchi noted . Abdomen: soft . Skin: no rash seen on limited exam . Musculoskeletal: not rigid . Psychiatric:unable to assess . Neurologic: no seizure no involuntary movements         Lab Data:   Basic Metabolic Panel: Recent Labs  Lab 10/25/18 0604 10/27/18 0318  NA 142 141  K 3.2* 3.3*  CL 105 104  CO2 26 23  GLUCOSE 89 89  BUN 12 12  CREATININE 0.37* <0.30*  CALCIUM 10.0 9.8  MG  --  1.9    ABG: No results for input(s): PHART, PCO2ART, PO2ART, HCO3, O2SAT in the last 168 hours.  Liver Function Tests: No results for input(s): AST, ALT, ALKPHOS, BILITOT, PROT, ALBUMIN in the last 168 hours. No results for input(s): LIPASE, AMYLASE in the last 168 hours. No results for input(s): AMMONIA in the last 168 hours.  CBC: Recent Labs  Lab 10/27/18 0318  WBC 10.2  HGB 10.4*  HCT 35.4*  MCV 87.2  PLT 271    Cardiac Enzymes: No results for input(s): CKTOTAL, CKMB, CKMBINDEX, TROPONINI in the last 168 hours.  BNP (last 3 results) No results for input(s): BNP in the last 8760 hours.  ProBNP (last 3 results) No results for input(s): PROBNP  in the last 8760 hours.  Radiological Exams: No results found.  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 Acute on chronic respiratory failure hypoxia continue with T collar during daytime rest on the ventilator at nighttime 2. Transverse myelitis grossly unchanged continue with supportive care. 3. Healthcare associated pneumonia treated clinically improving 4. Pulmonary embolism at baseline we will continue with supportive care.   I have personally seen and evaluated the patient, evaluated laboratory and imaging results, formulated the assessment and plan and placed orders. The Patient requires high complexity decision making for assessment and support.  Case was discussed on Rounds with the Respiratory Therapy Staff  Allyne Gee, MD [facility] Pulmonary Critical Care Medicine Sleep Medicine

## 2018-11-01 DIAGNOSIS — J9621 Acute and chronic respiratory failure with hypoxia: Secondary | ICD-10-CM | POA: Diagnosis not present

## 2018-11-01 DIAGNOSIS — G373 Acute transverse myelitis in demyelinating disease of central nervous system: Secondary | ICD-10-CM | POA: Diagnosis not present

## 2018-11-01 DIAGNOSIS — J189 Pneumonia, unspecified organism: Secondary | ICD-10-CM | POA: Diagnosis not present

## 2018-11-01 DIAGNOSIS — I2699 Other pulmonary embolism without acute cor pulmonale: Secondary | ICD-10-CM | POA: Diagnosis not present

## 2018-11-01 LAB — PROTIME-INR
INR: 1.9 — ABNORMAL HIGH (ref 0.8–1.2)
Prothrombin Time: 21.8 seconds — ABNORMAL HIGH (ref 11.4–15.2)

## 2018-11-01 NOTE — Progress Notes (Addendum)
Pulmonary Critical Care Medicine [place]   PULMONARY CRITICAL CARE SERVICE  PROGRESS NOTE  Date of Service: 11/01/2018  Benjamin Montoya  [id number]  DOB: [birth year]   DOA: 10/21/2018  Referring Physician: Merton Border, MD  HPI: Benjamin Montoya is a 48 y.o. male seen for follow up of Acute on Chronic Respiratory Failure.  Patient remains on aerosol trach collar using PMV with no difficulty FiO2 35% satting well.  Medications: Reviewed on Rounds  Physical Exam:  Vitals: Pulse 89 respirations 18 BP 115/69 O2 sat 96% temp 99.5  Ventilator Settings ATC 35%  . General: Comfortable at this time . Eyes: Grossly normal lids, irises & conjunctiva . ENT: grossly tongue is normal . Neck: no obvious mass . Cardiovascular: S1 S2 normal no gallop . Respiratory: No rales or rhonchi noted . Abdomen: soft . Skin: no rash seen on limited exam . Musculoskeletal: not rigid . Psychiatric:unable to assess . Neurologic: no seizure no involuntary movements         Lab Data:   Basic Metabolic Panel: Recent Labs  Lab 10/27/18 0318  NA 141  K 3.3*  CL 104  CO2 23  GLUCOSE 89  BUN 12  CREATININE <0.30*  CALCIUM 9.8  MG 1.9    ABG: No results for input(s): PHART, PCO2ART, PO2ART, HCO3, O2SAT in the last 168 hours.  Liver Function Tests: No results for input(s): AST, ALT, ALKPHOS, BILITOT, PROT, ALBUMIN in the last 168 hours. No results for input(s): LIPASE, AMYLASE in the last 168 hours. No results for input(s): AMMONIA in the last 168 hours.  CBC: Recent Labs  Lab 10/27/18 0318  WBC 10.2  HGB 10.4*  HCT 35.4*  MCV 87.2  PLT 271    Cardiac Enzymes: No results for input(s): CKTOTAL, CKMB, CKMBINDEX, TROPONINI in the last 168 hours.  BNP (last 3 results) No results for input(s): BNP in the last 8760 hours.  ProBNP (last 3 results) No results for input(s): PROBNP in the last 8760 hours.  Radiological Exams: No results  found.  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 Acute on chronic respiratory failure hypoxia continue with T collar during daytime rest on the ventilator at nighttime 2. Transverse myelitis grossly unchanged continue with supportive care. 3. Healthcare associated pneumonia treated clinically improving 4. Pulmonary embolism at baseline we will continue with supportive care.   I have personally seen and evaluated the patient, evaluated laboratory and imaging results, formulated the assessment and plan and placed orders. The Patient requires high complexity decision making for assessment and support.  Case was discussed on Rounds with the Respiratory Therapy Staff  Allyne Gee, MD [facility] Pulmonary Critical Care Medicine Sleep Medicine

## 2018-11-02 DIAGNOSIS — J189 Pneumonia, unspecified organism: Secondary | ICD-10-CM | POA: Diagnosis not present

## 2018-11-02 DIAGNOSIS — I2699 Other pulmonary embolism without acute cor pulmonale: Secondary | ICD-10-CM | POA: Diagnosis not present

## 2018-11-02 DIAGNOSIS — J9621 Acute and chronic respiratory failure with hypoxia: Secondary | ICD-10-CM | POA: Diagnosis not present

## 2018-11-02 DIAGNOSIS — G373 Acute transverse myelitis in demyelinating disease of central nervous system: Secondary | ICD-10-CM | POA: Diagnosis not present

## 2018-11-02 LAB — PROTIME-INR
INR: 2 — ABNORMAL HIGH (ref 0.8–1.2)
Prothrombin Time: 22.5 seconds — ABNORMAL HIGH (ref 11.4–15.2)

## 2018-11-02 NOTE — Progress Notes (Signed)
Pulmonary Critical Care Medicine [place]   PULMONARY CRITICAL CARE SERVICE  PROGRESS NOTE  Date of Service: 11/02/2018  Benjamin Montoya  [id number]  DOB: 02/28/1971   DOA: 10/21/2018  Referring Physician: Merton Border, MD  HPI: Benjamin Montoya is a 48 y.o. male seen for follow up of Acute on Chronic Respiratory Failure.  Currently patient is on T collar is on 35% FiO2 resting on the ventilator at nighttime.  Seems to be tolerating fairly well  Medications: Reviewed on Rounds  Physical Exam:  Vitals: Temperature 97.6 pulse 69 respiratory rate 16 blood pressure 123/65 saturations are 98%  Ventilator Settings off the ventilator currently on T collar FiO2 is 35%  . General: Comfortable at this time . Eyes: Grossly normal lids, irises & conjunctiva . ENT: grossly tongue is normal . Neck: no obvious mass . Cardiovascular: S1 S2 normal no gallop . Respiratory: No rhonchi no rales are noted at this time . Abdomen: soft . Skin: no rash seen on limited exam . Musculoskeletal: not rigid . Psychiatric:unable to assess . Neurologic: no seizure no involuntary movements         Lab Data:   Basic Metabolic Panel: Recent Labs  Lab 10/27/18 0318  NA 141  K 3.3*  CL 104  CO2 23  GLUCOSE 89  BUN 12  CREATININE <0.30*  CALCIUM 9.8  MG 1.9    ABG: No results for input(s): PHART, PCO2ART, PO2ART, HCO3, O2SAT in the last 168 hours.  Liver Function Tests: No results for input(s): AST, ALT, ALKPHOS, BILITOT, PROT, ALBUMIN in the last 168 hours. No results for input(s): LIPASE, AMYLASE in the last 168 hours. No results for input(s): AMMONIA in the last 168 hours.  CBC: Recent Labs  Lab 10/27/18 0318  WBC 10.2  HGB 10.4*  HCT 35.4*  MCV 87.2  PLT 271    Cardiac Enzymes: No results for input(s): CKTOTAL, CKMB, CKMBINDEX, TROPONINI in the last 168 hours.  BNP (last 3 results) No results for input(s): BNP in the last 8760 hours.  ProBNP (last 3  results) No results for input(s): PROBNP in the last 8760 hours.  Radiological Exams: No results found.  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 Acute on chronic respiratory failure with hypoxia we will continue with T collar trials titrate oxygen continue pulmonary toilet. 2. Transverse myelitis we will continue with supportive care 3. Healthcare associated pneumonia treated improving 4. Pulmonary embolism at baseline   I have personally seen and evaluated the patient, evaluated laboratory and imaging results, formulated the assessment and plan and placed orders. The Patient requires high complexity decision making for assessment and support.  Case was discussed on Rounds with the Respiratory Therapy Staff  Allyne Gee, MD [facility] Pulmonary Critical Care Medicine Sleep Medicine

## 2018-11-03 DIAGNOSIS — I2699 Other pulmonary embolism without acute cor pulmonale: Secondary | ICD-10-CM | POA: Diagnosis not present

## 2018-11-03 DIAGNOSIS — J9621 Acute and chronic respiratory failure with hypoxia: Secondary | ICD-10-CM | POA: Diagnosis not present

## 2018-11-03 DIAGNOSIS — J189 Pneumonia, unspecified organism: Secondary | ICD-10-CM | POA: Diagnosis not present

## 2018-11-03 DIAGNOSIS — G373 Acute transverse myelitis in demyelinating disease of central nervous system: Secondary | ICD-10-CM | POA: Diagnosis not present

## 2018-11-03 LAB — PROTIME-INR
INR: 2.1 — ABNORMAL HIGH (ref 0.8–1.2)
Prothrombin Time: 23.2 seconds — ABNORMAL HIGH (ref 11.4–15.2)

## 2018-11-03 NOTE — Progress Notes (Signed)
Pulmonary Critical Care Medicine [place]   PULMONARY CRITICAL CARE SERVICE  PROGRESS NOTE  Date of Service: 11/03/2018  Dub Maclellan  [id number]  DOB: [birth year]   DOA: 10/21/2018  Referring Physician: Merton Border, MD  HPI: Darol Cush is a 48 y.o. male seen for follow up of Acute on Chronic Respiratory Failure.  Patient is on T collar currently on 35% FiO2  Medications: Reviewed on Rounds  Physical Exam:  Vitals: Temperature 96.7 pulse 64 respiratory 18 blood pressure 156/88 saturations 93%  Ventilator Settings off ventilator on T collar resting on pressure support at night  . General: Comfortable at this time . Eyes: Grossly normal lids, irises & conjunctiva . ENT: grossly tongue is normal . Neck: no obvious mass . Cardiovascular: S1 S2 normal no gallop . Respiratory: No rhonchi no rales are noted . Abdomen: soft . Skin: no rash seen on limited exam . Musculoskeletal: not rigid . Psychiatric:unable to assess . Neurologic: no seizure no involuntary movements         Lab Data:   Basic Metabolic Panel: No results for input(s): NA, K, CL, CO2, GLUCOSE, BUN, CREATININE, CALCIUM, MG, PHOS in the last 168 hours.  ABG: No results for input(s): PHART, PCO2ART, PO2ART, HCO3, O2SAT in the last 168 hours.  Liver Function Tests: No results for input(s): AST, ALT, ALKPHOS, BILITOT, PROT, ALBUMIN in the last 168 hours. No results for input(s): LIPASE, AMYLASE in the last 168 hours. No results for input(s): AMMONIA in the last 168 hours.  CBC: No results for input(s): WBC, NEUTROABS, HGB, HCT, MCV, PLT in the last 168 hours.  Cardiac Enzymes: No results for input(s): CKTOTAL, CKMB, CKMBINDEX, TROPONINI in the last 168 hours.  BNP (last 3 results) No results for input(s): BNP in the last 8760 hours.  ProBNP (last 3 results) No results for input(s): PROBNP in the last 8760 hours.  Radiological Exams: No results  found.  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 Acute on chronic respiratory failure with hypoxia patient is on T collar trials which will be continued.  Continue secretion management pulmonary toilet 2. Acute transverse myelitis we will continue with present management 3. Healthcare associated pneumonia treated clinically improved 4. Pulmonary embolism at baseline continue to monitor   I have personally seen and evaluated the patient, evaluated laboratory and imaging results, formulated the assessment and plan and placed orders. The Patient requires high complexity decision making for assessment and support.  Case was discussed on Rounds with the Respiratory Therapy Staff  Allyne Gee, MD [facility] Pulmonary Critical Care Medicine Sleep Medicine

## 2018-11-04 DIAGNOSIS — G373 Acute transverse myelitis in demyelinating disease of central nervous system: Secondary | ICD-10-CM | POA: Diagnosis not present

## 2018-11-04 DIAGNOSIS — J9621 Acute and chronic respiratory failure with hypoxia: Secondary | ICD-10-CM | POA: Diagnosis not present

## 2018-11-04 DIAGNOSIS — J189 Pneumonia, unspecified organism: Secondary | ICD-10-CM | POA: Diagnosis not present

## 2018-11-04 DIAGNOSIS — I2699 Other pulmonary embolism without acute cor pulmonale: Secondary | ICD-10-CM | POA: Diagnosis not present

## 2018-11-04 LAB — BASIC METABOLIC PANEL
Anion gap: 13 (ref 5–15)
BUN: 14 mg/dL (ref 6–20)
CO2: 26 mmol/L (ref 22–32)
Calcium: 9.7 mg/dL (ref 8.9–10.3)
Chloride: 99 mmol/L (ref 98–111)
Creatinine, Ser: 0.37 mg/dL — ABNORMAL LOW (ref 0.61–1.24)
GFR calc Af Amer: >60 mL/min (ref >60)
GFR calc non Af Amer: >60 mL/min (ref >60)
Glucose, Bld: 101 mg/dL — ABNORMAL HIGH (ref 70–99)
Potassium: 3.5 mmol/L (ref 3.5–5.1)
Sodium: 138 mmol/L (ref 135–145)

## 2018-11-04 LAB — CBC
HCT: 38.1 % — ABNORMAL LOW (ref 39.0–52.0)
Hemoglobin: 11.4 g/dL — ABNORMAL LOW (ref 13.0–17.0)
MCH: 25.4 pg — ABNORMAL LOW (ref 26.0–34.0)
MCHC: 29.9 g/dL — ABNORMAL LOW (ref 30.0–36.0)
MCV: 84.9 fL (ref 80.0–100.0)
Platelets: 247 10*3/uL (ref 150–400)
RBC: 4.49 MIL/uL (ref 4.22–5.81)
RDW: 17.6 % — ABNORMAL HIGH (ref 11.5–15.5)
WBC: 9.8 10*3/uL (ref 4.0–10.5)
nRBC: 0 % (ref 0.0–0.2)

## 2018-11-04 LAB — PROTIME-INR
INR: 2.6 — ABNORMAL HIGH (ref 0.8–1.2)
Prothrombin Time: 27.2 seconds — ABNORMAL HIGH (ref 11.4–15.2)

## 2018-11-04 NOTE — Progress Notes (Signed)
Pulmonary Critical Care Medicine [place]   PULMONARY CRITICAL CARE SERVICE  PROGRESS NOTE  Date of Service: 11/04/2018  Benjamin Montoya  [id number]  DOB: December 31, 1970   DOA: 10/21/2018  Referring Physician: Merton Border, MD  HPI: Benjamin Montoya is a 48 y.o. male seen for follow up of Acute on Chronic Respiratory Failure.  Currently patient is on T collar is on 35% FiO2  Medications: Reviewed on Rounds  Physical Exam:  Vitals: Temperature 96.7 pulse 77 respiratory 18 blood pressure 148/74 saturations 97%  Ventilator Settings on T collar with 35% FiO2  . General: Comfortable at this time . Eyes: Grossly normal lids, irises & conjunctiva . ENT: grossly tongue is normal . Neck: no obvious mass . Cardiovascular: S1 S2 normal no gallop . Respiratory: No rhonchi no rales . Abdomen: soft . Skin: no rash seen on limited exam . Musculoskeletal: not rigid . Psychiatric:unable to assess . Neurologic: no seizure no involuntary movements         Lab Data:   Basic Metabolic Panel: Recent Labs  Lab 11/04/18 0557  NA 138  K 3.5  CL 99  CO2 26  GLUCOSE 101*  BUN 14  CREATININE 0.37*  CALCIUM 9.7    ABG: No results for input(s): PHART, PCO2ART, PO2ART, HCO3, O2SAT in the last 168 hours.  Liver Function Tests: No results for input(s): AST, ALT, ALKPHOS, BILITOT, PROT, ALBUMIN in the last 168 hours. No results for input(s): LIPASE, AMYLASE in the last 168 hours. No results for input(s): AMMONIA in the last 168 hours.  CBC: Recent Labs  Lab 11/04/18 0557  WBC 9.8  HGB 11.4*  HCT 38.1*  MCV 84.9  PLT 247    Cardiac Enzymes: No results for input(s): CKTOTAL, CKMB, CKMBINDEX, TROPONINI in the last 168 hours.  BNP (last 3 results) No results for input(s): BNP in the last 8760 hours.  ProBNP (last 3 results) No results for input(s): PROBNP in the last 8760 hours.  Radiological Exams: No results found.  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 Acute on chronic respiratory failure with hypoxia patient is at baseline doing better off the ventilator has been in better spirits also 2. Acute transverse myelitis unchanged continue with supportive care 3. Healthcare associated pneumonia treated we will follow 4. Pulmonary embolism at baseline continue with supportive care   I have personally seen and evaluated the patient, evaluated laboratory and imaging results, formulated the assessment and plan and placed orders. The Patient requires high complexity decision making for assessment and support.  Case was discussed on Rounds with the Respiratory Therapy Staff  Allyne Gee, MD [facility] Pulmonary Critical Care Medicine Sleep Medicine

## 2018-11-05 DIAGNOSIS — J189 Pneumonia, unspecified organism: Secondary | ICD-10-CM | POA: Diagnosis not present

## 2018-11-05 DIAGNOSIS — I2699 Other pulmonary embolism without acute cor pulmonale: Secondary | ICD-10-CM | POA: Diagnosis not present

## 2018-11-05 DIAGNOSIS — J9621 Acute and chronic respiratory failure with hypoxia: Secondary | ICD-10-CM | POA: Diagnosis not present

## 2018-11-05 DIAGNOSIS — G373 Acute transverse myelitis in demyelinating disease of central nervous system: Secondary | ICD-10-CM | POA: Diagnosis not present

## 2018-11-05 LAB — PROTIME-INR
INR: 2.5 — ABNORMAL HIGH (ref 0.8–1.2)
Prothrombin Time: 26.8 seconds — ABNORMAL HIGH (ref 11.4–15.2)

## 2018-11-05 NOTE — Progress Notes (Addendum)
Pulmonary Critical Care Medicine [place]   PULMONARY CRITICAL CARE SERVICE  PROGRESS NOTE  Date of Service: 11/05/2018  Benjamin Montoya  HKV:[id number]  DOB: [birth year]   DOA: 10/21/2018  Referring Physician: Merton Border, MD  HPI: Benjamin Montoya is a 48 y.o. male seen for follow up of Acute on Chronic Respiratory Failure.  Patient remains on aerosol trach collar 35% during the day and resting on the ventilator at night.  Satting well with no fever or distress.  Medications: Reviewed on Rounds  Physical Exam:  Vitals: Pulse 102 respirations 14 BP 128/76 O2 sat 99% temp 98.7  Ventilator Settings ATC 35%  . General: Comfortable at this time . Eyes: Grossly normal lids, irises & conjunctiva . ENT: grossly tongue is normal . Neck: no obvious mass . Cardiovascular: S1 S2 normal no gallop . Respiratory: No rales or rhonchi noted . Abdomen: soft . Skin: no rash seen on limited exam . Musculoskeletal: not rigid . Psychiatric:unable to assess . Neurologic: no seizure no involuntary movements         Lab Data:   Basic Metabolic Panel: Recent Labs  Lab 11/04/18 0557  NA 138  K 3.5  CL 99  CO2 26  GLUCOSE 101*  BUN 14  CREATININE 0.37*  CALCIUM 9.7    ABG: No results for input(s): PHART, PCO2ART, PO2ART, HCO3, O2SAT in the last 168 hours.  Liver Function Tests: No results for input(s): AST, ALT, ALKPHOS, BILITOT, PROT, ALBUMIN in the last 168 hours. No results for input(s): LIPASE, AMYLASE in the last 168 hours. No results for input(s): AMMONIA in the last 168 hours.  CBC: Recent Labs  Lab 11/04/18 0557  WBC 9.8  HGB 11.4*  HCT 38.1*  MCV 84.9  PLT 247    Cardiac Enzymes: No results for input(s): CKTOTAL, CKMB, CKMBINDEX, TROPONINI in the last 168 hours.  BNP (last 3 results) No results for input(s): BNP in the last 8760 hours.  ProBNP (last 3 results) No results for input(s): PROBNP in the last 8760 hours.  Radiological  Exams: No results found.  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 Acute on chronic respiratory failure with hypoxia patient continues to do well off the ventilator during the day.  We will continue to attempt weaning aerosol trach collar.  Continue supportive measures 2. Acute transverse myelitis unchanged continue with supportive care 3. Healthcare associated pneumonia treated we will follow 4. Pulmonary embolism at baseline continue with supportive care   I have personally seen and evaluated the patient, evaluated laboratory and imaging results, formulated the assessment and plan and placed orders. The Patient requires high complexity decision making for assessment and support.  Case was discussed on Rounds with the Respiratory Therapy Staff  Allyne Gee, MD [facility] Pulmonary Critical Care Medicine Sleep Medicine

## 2018-11-06 DIAGNOSIS — J189 Pneumonia, unspecified organism: Secondary | ICD-10-CM | POA: Diagnosis not present

## 2018-11-06 DIAGNOSIS — J9621 Acute and chronic respiratory failure with hypoxia: Secondary | ICD-10-CM | POA: Diagnosis not present

## 2018-11-06 DIAGNOSIS — G373 Acute transverse myelitis in demyelinating disease of central nervous system: Secondary | ICD-10-CM | POA: Diagnosis not present

## 2018-11-06 DIAGNOSIS — I2699 Other pulmonary embolism without acute cor pulmonale: Secondary | ICD-10-CM | POA: Diagnosis not present

## 2018-11-06 LAB — PROTIME-INR
INR: 2.6 — ABNORMAL HIGH (ref 0.8–1.2)
INR: 2.6 — ABNORMAL HIGH (ref 0.8–1.2)
Prothrombin Time: 27.3 seconds — ABNORMAL HIGH (ref 11.4–15.2)
Prothrombin Time: 27.1 seconds — ABNORMAL HIGH (ref 11.4–15.2)

## 2018-11-06 LAB — URINALYSIS, ROUTINE W REFLEX MICROSCOPIC
Bilirubin Urine: NEGATIVE
Glucose, UA: NEGATIVE mg/dL
Ketones, ur: NEGATIVE mg/dL
Nitrite: NEGATIVE
Protein, ur: 100 mg/dL — AB
RBC / HPF: >50 RBC/hpf — ABNORMAL HIGH (ref 0–5)
Specific Gravity, Urine: 1.023 (ref 1.005–1.030)
pH: 5 (ref 5.0–8.0)

## 2018-11-06 LAB — CBC
HCT: 37.5 % — ABNORMAL LOW (ref 39.0–52.0)
Hemoglobin: 11.5 g/dL — ABNORMAL LOW (ref 13.0–17.0)
MCH: 25.8 pg — ABNORMAL LOW (ref 26.0–34.0)
MCHC: 30.7 g/dL (ref 30.0–36.0)
MCV: 84.1 fL (ref 80.0–100.0)
Platelets: 383 10*3/uL (ref 150–400)
RBC: 4.46 MIL/uL (ref 4.22–5.81)
RDW: 17.1 % — ABNORMAL HIGH (ref 11.5–15.5)
WBC: 13.8 10*3/uL — ABNORMAL HIGH (ref 4.0–10.5)
nRBC: 0 % (ref 0.0–0.2)

## 2018-11-06 NOTE — Progress Notes (Addendum)
Pulmonary Critical Care Medicine [place]   PULMONARY CRITICAL CARE SERVICE  PROGRESS NOTE  Date of Service: 11/06/2018  Benjamin Montoya  FOY:[id number]  DOB: June 24, 1970   DOA: 10/21/2018  Referring Physician: Merton Border, MD  HPI: Benjamin Montoya is a 48 y.o. male seen for follow up of Acute on Chronic Respiratory Failure.  Patient remains on aerosol trach collar 35% FiO2 using PMV.  Continues to rest ventilator at night.  Medications: Reviewed on Rounds  Physical Exam:  Vitals: Pulse 90 respirations 14 BP 143/81 O2 sat 100% temp 97.4  Ventilator Settings 35% ATC  . General: Comfortable at this time . Eyes: Grossly normal lids, irises & conjunctiva . ENT: grossly tongue is normal . Neck: no obvious mass . Cardiovascular: S1 S2 normal no gallop . Respiratory: No rales or rhonchi noted . Abdomen: soft . Skin: no rash seen on limited exam . Musculoskeletal: not rigid . Psychiatric:unable to assess . Neurologic: no seizure no involuntary movements         Lab Data:   Basic Metabolic Panel: Recent Labs  Lab 11/04/18 0557  NA 138  K 3.5  CL 99  CO2 26  GLUCOSE 101*  BUN 14  CREATININE 0.37*  CALCIUM 9.7    ABG: No results for input(s): PHART, PCO2ART, PO2ART, HCO3, O2SAT in the last 168 hours.  Liver Function Tests: No results for input(s): AST, ALT, ALKPHOS, BILITOT, PROT, ALBUMIN in the last 168 hours. No results for input(s): LIPASE, AMYLASE in the last 168 hours. No results for input(s): AMMONIA in the last 168 hours.  CBC: Recent Labs  Lab 11/04/18 0557  WBC 9.8  HGB 11.4*  HCT 38.1*  MCV 84.9  PLT 247    Cardiac Enzymes: No results for input(s): CKTOTAL, CKMB, CKMBINDEX, TROPONINI in the last 168 hours.  BNP (last 3 results) No results for input(s): BNP in the last 8760 hours.  ProBNP (last 3 results) No results for input(s): PROBNP in the last 8760 hours.  Radiological Exams: No results  found.  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 Acute on chronic respiratory failure with hypoxia patient continues to do well off the ventilator during the day.  We will continue to attempt weaning aerosol trach collar.  Continue supportive measures 2. Acute transverse myelitis unchanged continue with supportive care 3. Healthcare associated pneumonia treated we will follow 4. Pulmonary embolism at baseline continue with supportive care   I have personally seen and evaluated the patient, evaluated laboratory and imaging results, formulated the assessment and plan and placed orders. The Patient requires high complexity decision making for assessment and support.  Case was discussed on Rounds with the Respiratory Therapy Staff  Allyne Gee, MD [facility] Pulmonary Critical Care Medicine Sleep Medicine

## 2018-11-07 ENCOUNTER — Other Ambulatory Visit (HOSPITAL_COMMUNITY): Payer: 59

## 2018-11-07 DIAGNOSIS — J9621 Acute and chronic respiratory failure with hypoxia: Secondary | ICD-10-CM | POA: Diagnosis not present

## 2018-11-07 DIAGNOSIS — I2699 Other pulmonary embolism without acute cor pulmonale: Secondary | ICD-10-CM | POA: Diagnosis not present

## 2018-11-07 DIAGNOSIS — G373 Acute transverse myelitis in demyelinating disease of central nervous system: Secondary | ICD-10-CM | POA: Diagnosis not present

## 2018-11-07 DIAGNOSIS — J189 Pneumonia, unspecified organism: Secondary | ICD-10-CM | POA: Diagnosis not present

## 2018-11-07 LAB — URINE CULTURE: Culture: NO GROWTH

## 2018-11-07 LAB — CBC
HCT: 36.3 % — ABNORMAL LOW (ref 39.0–52.0)
Hemoglobin: 10.8 g/dL — ABNORMAL LOW (ref 13.0–17.0)
MCH: 25.3 pg — ABNORMAL LOW (ref 26.0–34.0)
MCHC: 29.8 g/dL — ABNORMAL LOW (ref 30.0–36.0)
MCV: 85 fL (ref 80.0–100.0)
Platelets: 304 10*3/uL (ref 150–400)
RBC: 4.27 MIL/uL (ref 4.22–5.81)
RDW: 17.1 % — ABNORMAL HIGH (ref 11.5–15.5)
WBC: 10.4 10*3/uL (ref 4.0–10.5)
nRBC: 0 % (ref 0.0–0.2)

## 2018-11-07 LAB — BASIC METABOLIC PANEL
Anion gap: 12 (ref 5–15)
BUN: 20 mg/dL (ref 6–20)
CO2: 24 mmol/L (ref 22–32)
Calcium: 9.3 mg/dL (ref 8.9–10.3)
Chloride: 97 mmol/L — ABNORMAL LOW (ref 98–111)
Creatinine, Ser: 0.36 mg/dL — ABNORMAL LOW (ref 0.61–1.24)
GFR calc Af Amer: >60 mL/min (ref >60)
GFR calc non Af Amer: >60 mL/min (ref >60)
Glucose, Bld: 112 mg/dL — ABNORMAL HIGH (ref 70–99)
Potassium: 4 mmol/L (ref 3.5–5.1)
Sodium: 133 mmol/L — ABNORMAL LOW (ref 135–145)

## 2018-11-07 LAB — PROTIME-INR
INR: 2.4 — ABNORMAL HIGH (ref 0.8–1.2)
Prothrombin Time: 25.4 seconds — ABNORMAL HIGH (ref 11.4–15.2)

## 2018-11-07 LAB — VANCOMYCIN, TROUGH: Vancomycin Tr: 27 ug/mL (ref 15–20)

## 2018-11-07 NOTE — Progress Notes (Addendum)
Pulmonary Critical Care Medicine [place]   PULMONARY CRITICAL CARE SERVICE  PROGRESS NOTE  Date of Service: 11/07/2018  Jerrin Recore  HER:[id number]  DOB: 1971/03/02   DOA: 10/21/2018  Referring Physician: Merton Border, MD  HPI: Noboru Bidinger is a 48 y.o. male seen for follow up of Acute on Chronic Respiratory Failure.  Patient continues on aerosol trach collar 35% during the day and resting on the vent at night currently satting well with no distress.  Using PMV.  Medications: Reviewed on Rounds  Physical Exam:  Vitals: Pulse 103 respirations 17 BP 109/64 O2 sat 98% temp 97.8  Ventilator Settings ATC 35%  . General: Comfortable at this time . Eyes: Grossly normal lids, irises & conjunctiva . ENT: grossly tongue is normal . Neck: no obvious mass . Cardiovascular: S1 S2 normal no gallop . Respiratory: No rales or rhonchi noted . Abdomen: soft . Skin: no rash seen on limited exam . Musculoskeletal: not rigid . Psychiatric:unable to assess . Neurologic: no seizure no involuntary movements         Lab Data:   Basic Metabolic Panel: Recent Labs  Lab 11/04/18 0557 11/07/18 0539  NA 138 133*  K 3.5 4.0  CL 99 97*  CO2 26 24  GLUCOSE 101* 112*  BUN 14 20  CREATININE 0.37* 0.36*  CALCIUM 9.7 9.3    ABG: No results for input(s): PHART, PCO2ART, PO2ART, HCO3, O2SAT in the last 168 hours.  Liver Function Tests: No results for input(s): AST, ALT, ALKPHOS, BILITOT, PROT, ALBUMIN in the last 168 hours. No results for input(s): LIPASE, AMYLASE in the last 168 hours. No results for input(s): AMMONIA in the last 168 hours.  CBC: Recent Labs  Lab 11/04/18 0557 11/06/18 1552 11/07/18 0539  WBC 9.8 13.8* 10.4  HGB 11.4* 11.5* 10.8*  HCT 38.1* 37.5* 36.3*  MCV 84.9 84.1 85.0  PLT 247 383 304    Cardiac Enzymes: No results for input(s): CKTOTAL, CKMB, CKMBINDEX, TROPONINI in the last 168 hours.  BNP (last 3 results) No results for  input(s): BNP in the last 8760 hours.  ProBNP (last 3 results) No results for input(s): PROBNP in the last 8760 hours.  Radiological Exams: Dg Chest Port 1 View  Result Date: 11/07/2018 CLINICAL DATA:  Pneumonia EXAM: PORTABLE CHEST 1 VIEW COMPARISON:  Tracheostomy tube in place FINDINGS: Streaky and angular densities at the bases. No edema, effusion, or pneumothorax. Normal heart size and mediastinal contours. IMPRESSION: Atelectatic type opacities at the bases. History of pneumonia-there may be underlying infection. Lung volumes are mildly improved. Electronically Signed   By: Monte Fantasia M.D.   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)   Healthcare-associated pneumonia   Pulmonary embolism ([place])   1. Acute on chronic respiratory failure with hypoxiapatient continues to do well off the ventilator during the day. We will continue to attempt weaning aerosol trach collar. Continue supportive measures 2. Acute transverse myelitis unchanged continue with supportive care 3. Healthcare associated pneumonia treated we will follow 4. Pulmonary embolism at baseline continue with supportive care   I have personally seen and evaluated the patient, evaluated laboratory and imaging results, formulated the assessment and plan and placed orders. The Patient requires high complexity decision making for assessment and support.  Case was discussed on Rounds with the Respiratory Therapy Staff  Saadat A  Humphrey Rolls, MD [facility] Pulmonary Critical Care Medicine Sleep Medicine

## 2018-11-08 DIAGNOSIS — I2699 Other pulmonary embolism without acute cor pulmonale: Secondary | ICD-10-CM | POA: Diagnosis not present

## 2018-11-08 DIAGNOSIS — J189 Pneumonia, unspecified organism: Secondary | ICD-10-CM | POA: Diagnosis not present

## 2018-11-08 DIAGNOSIS — G373 Acute transverse myelitis in demyelinating disease of central nervous system: Secondary | ICD-10-CM | POA: Diagnosis not present

## 2018-11-08 DIAGNOSIS — J9621 Acute and chronic respiratory failure with hypoxia: Secondary | ICD-10-CM | POA: Diagnosis not present

## 2018-11-08 LAB — CBC
HCT: 33.1 % — ABNORMAL LOW (ref 39.0–52.0)
Hemoglobin: 9.9 g/dL — ABNORMAL LOW (ref 13.0–17.0)
MCH: 25.6 pg — ABNORMAL LOW (ref 26.0–34.0)
MCHC: 29.9 g/dL — ABNORMAL LOW (ref 30.0–36.0)
MCV: 85.5 fL (ref 80.0–100.0)
Platelets: 334 10*3/uL (ref 150–400)
RBC: 3.87 MIL/uL — ABNORMAL LOW (ref 4.22–5.81)
RDW: 16.8 % — ABNORMAL HIGH (ref 11.5–15.5)
WBC: 10.3 10*3/uL (ref 4.0–10.5)
nRBC: 0 % (ref 0.0–0.2)

## 2018-11-08 LAB — BASIC METABOLIC PANEL
Anion gap: 11 (ref 5–15)
BUN: 14 mg/dL (ref 6–20)
CO2: 26 mmol/L (ref 22–32)
Calcium: 9.7 mg/dL (ref 8.9–10.3)
Chloride: 99 mmol/L (ref 98–111)
Creatinine, Ser: 0.41 mg/dL — ABNORMAL LOW (ref 0.61–1.24)
GFR calc Af Amer: >60 mL/min (ref >60)
GFR calc non Af Amer: >60 mL/min (ref >60)
Glucose, Bld: 107 mg/dL — ABNORMAL HIGH (ref 70–99)
Potassium: 3.7 mmol/L (ref 3.5–5.1)
Sodium: 136 mmol/L (ref 135–145)

## 2018-11-08 LAB — VANCOMYCIN, TROUGH: Vancomycin Tr: 10 ug/mL — ABNORMAL LOW (ref 15–20)

## 2018-11-08 LAB — MAGNESIUM: Magnesium: 2 mg/dL (ref 1.7–2.4)

## 2018-11-08 NOTE — Progress Notes (Addendum)
Pulmonary Critical Care Medicine [place]   PULMONARY CRITICAL CARE SERVICE  PROGRESS NOTE  Date of Service: 11/08/2018  Benjamin Montoya  UXL:[id number]  DOB: 1971/03/22   DOA: 10/21/2018  Referring Physician: Merton Border, MD  HPI: Benjamin Montoya is a 48 y.o. male seen for follow up of Acute on Chronic Respiratory Failure. Pt continues to do well, currently on ATC 35% with PMV.  He rests on vent at night.   Medications: Reviewed on Rounds  Physical Exam:  Vitals: pulse 80, resp 12, bp 118/58, sat 96, temp 97.0  Ventilator Settings 35% ATC  . General: Comfortable at this time . Eyes: Grossly normal lids, irises & conjunctiva . ENT: grossly tongue is normal . Neck: no obvious mass . Cardiovascular: S1 S2 normal no gallop . Respiratory: No rales or ronchi noted. . Abdomen: soft . Skin: no rash seen on limited exam . Musculoskeletal: not rigid . Psychiatric:unable to assess . Neurologic: no seizure no involuntary movements         Lab Data:   Basic Metabolic Panel: Recent Labs  Lab 11/04/18 0557 11/07/18 0539 11/08/18 1106  NA 138 133* 136  K 3.5 4.0 3.7  CL 99 97* 99  CO2 26 24 26   GLUCOSE 101* 112* 107*  BUN 14 20 14   CREATININE 0.37* 0.36* 0.41*  CALCIUM 9.7 9.3 9.7  MG  --   --  2.0    ABG: No results for input(s): PHART, PCO2ART, PO2ART, HCO3, O2SAT in the last 168 hours.  Liver Function Tests: No results for input(s): AST, ALT, ALKPHOS, BILITOT, PROT, ALBUMIN in the last 168 hours. No results for input(s): LIPASE, AMYLASE in the last 168 hours. No results for input(s): AMMONIA in the last 168 hours.  CBC: Recent Labs  Lab 11/04/18 0557 11/06/18 1552 11/07/18 0539 11/08/18 1106  WBC 9.8 13.8* 10.4 10.3  HGB 11.4* 11.5* 10.8* 9.9*  HCT 38.1* 37.5* 36.3* 33.1*  MCV 84.9 84.1 85.0 85.5  PLT 247 383 304 334    Cardiac Enzymes: No results for input(s): CKTOTAL, CKMB, CKMBINDEX, TROPONINI in the last 168 hours.  BNP (last 3  results) No results for input(s): BNP in the last 8760 hours.  ProBNP (last 3 results) No results for input(s): PROBNP in the last 8760 hours.  Radiological Exams: Dg Chest Port 1 View  Result Date: 11/07/2018 CLINICAL DATA:  Pneumonia EXAM: PORTABLE CHEST 1 VIEW COMPARISON:  Tracheostomy tube in place FINDINGS: Streaky and angular densities at the bases. No edema, effusion, or pneumothorax. Normal heart size and mediastinal contours. IMPRESSION: Atelectatic type opacities at the bases. History of pneumonia-there may be underlying infection. Lung volumes are mildly improved. Electronically Signed   By: Monte Fantasia M.D.   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 Acute on chronic respiratory failure with hypoxiapatient continues to do well off the ventilator during the day. We will continue to attempt weaning aerosol trach collar. Continue supportive measures 2. Acute transverse myelitis unchanged continue with supportive care 3. Healthcare associated pneumonia treated we will follow 4. Pulmonary embolism at baseline continue with supportive care   I have personally seen and evaluated the patient, evaluated laboratory and imaging results, formulated the assessment and plan and placed orders. The Patient requires high complexity decision making for  assessment and support.  Case was discussed on Rounds with the Respiratory Therapy Staff  Allyne Gee, MD [facility] Pulmonary Critical Care Medicine Sleep Medicine

## 2018-11-09 DIAGNOSIS — J189 Pneumonia, unspecified organism: Secondary | ICD-10-CM | POA: Diagnosis not present

## 2018-11-09 DIAGNOSIS — G373 Acute transverse myelitis in demyelinating disease of central nervous system: Secondary | ICD-10-CM | POA: Diagnosis not present

## 2018-11-09 DIAGNOSIS — I2699 Other pulmonary embolism without acute cor pulmonale: Secondary | ICD-10-CM | POA: Diagnosis not present

## 2018-11-09 DIAGNOSIS — J9621 Acute and chronic respiratory failure with hypoxia: Secondary | ICD-10-CM | POA: Diagnosis not present

## 2018-11-09 LAB — CBC
HCT: 28.6 % — ABNORMAL LOW (ref 39.0–52.0)
Hemoglobin: 8.6 g/dL — ABNORMAL LOW (ref 13.0–17.0)
MCH: 25.4 pg — ABNORMAL LOW (ref 26.0–34.0)
MCHC: 30.1 g/dL (ref 30.0–36.0)
MCV: 84.4 fL (ref 80.0–100.0)
Platelets: 288 10*3/uL (ref 150–400)
RBC: 3.39 MIL/uL — ABNORMAL LOW (ref 4.22–5.81)
RDW: 16.6 % — ABNORMAL HIGH (ref 11.5–15.5)
WBC: 7.4 10*3/uL (ref 4.0–10.5)
nRBC: 0 % (ref 0.0–0.2)

## 2018-11-09 LAB — VANCOMYCIN, TROUGH: Vancomycin Tr: 13 ug/mL — ABNORMAL LOW (ref 15–20)

## 2018-11-09 LAB — CULTURE, RESPIRATORY W GRAM STAIN: Culture: NORMAL

## 2018-11-09 LAB — PROTIME-INR
INR: 1.4 — ABNORMAL HIGH (ref 0.8–1.2)
Prothrombin Time: 17 seconds — ABNORMAL HIGH (ref 11.4–15.2)

## 2018-11-09 NOTE — Progress Notes (Addendum)
Pulmonary Critical Care Medicine [name]   PULMONARY CRITICAL CARE SERVICE  PROGRESS NOTE  Date of Service: 11/09/2018  Benjamin Montoya  ERD:[id number]  DOB: 1970/12/15   DOA: 10/21/2018  Referring Physician: Merton Border, MD  HPI: Benjamin Montoya is a 48 y.o. male seen for follow up of Acute on Chronic Respiratory Failure.  Patient continues on 35 to aerosol trach collar using PMV with no difficulty resting on the vent at night.  Medications: Reviewed on Rounds  Physical Exam:  Vitals: Pulse 70 rations 10 BP 104 54 O2 sat 99% temp 97.4  Ventilator Settings 30% ATC  . General: Comfortable at this time . Eyes: Grossly normal lids, irises & conjunctiva . ENT: grossly tongue is normal . Neck: no obvious mass . Cardiovascular: S1 S2 normal no gallop . Respiratory: No rales or rhonchi noted . Abdomen: soft . Skin: no rash seen on limited exam . Musculoskeletal: not rigid . Psychiatric:unable to assess . Neurologic: no seizure no involuntary movements         Lab Data:   Basic Metabolic Panel: Recent Labs  Lab 11/04/18 0557 11/07/18 0539 11/08/18 1106  NA 138 133* 136  K 3.5 4.0 3.7  CL 99 97* 99  CO2 26 24 26   GLUCOSE 101* 112* 107*  BUN 14 20 14   CREATININE 0.37* 0.36* 0.41*  CALCIUM 9.7 9.3 9.7  MG  --   --  2.0    ABG: No results for input(s): PHART, PCO2ART, PO2ART, HCO3, O2SAT in the last 168 hours.  Liver Function Tests: No results for input(s): AST, ALT, ALKPHOS, BILITOT, PROT, ALBUMIN in the last 168 hours. No results for input(s): LIPASE, AMYLASE in the last 168 hours. No results for input(s): AMMONIA in the last 168 hours.  CBC: Recent Labs  Lab 11/04/18 0557 11/06/18 1552 11/07/18 0539 11/08/18 1106 11/09/18 0542  WBC 9.8 13.8* 10.4 10.3 7.4  HGB 11.4* 11.5* 10.8* 9.9* 8.6*  HCT 38.1* 37.5* 36.3* 33.1* 28.6*  MCV 84.9 84.1 85.0 85.5 84.4  PLT 247 383 304 334 288    Cardiac Enzymes: No results for input(s): CKTOTAL,  CKMB, CKMBINDEX, TROPONINI in the last 168 hours.  BNP (last 3 results) No results for input(s): BNP in the last 8760 hours.  ProBNP (last 3 results) No results for input(s): PROBNP in the last 8760 hours.  Radiological Exams: No results found.  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 Acute on chronic respiratory failure with hypoxiapatient continues to do well off the ventilator during the day. We will continue to attempt weaning aerosol trach collar. Continue supportive measures 2. Acute transverse myelitis unchanged continue with supportive care 3. Healthcare associated pneumonia treated we will follow 4. Pulmonary embolism at baseline continue with supportive care   I have personally seen and evaluated the patient, evaluated laboratory and imaging results, formulated the assessment and plan and placed orders. The Patient requires high complexity decision making for assessment and support.  Case was discussed on Rounds with the Respiratory Therapy Staff  Allyne Gee, MD [facility] Pulmonary Critical Care Medicine Sleep Medicine

## 2018-11-10 DIAGNOSIS — J9621 Acute and chronic respiratory failure with hypoxia: Secondary | ICD-10-CM | POA: Diagnosis not present

## 2018-11-10 DIAGNOSIS — J189 Pneumonia, unspecified organism: Secondary | ICD-10-CM | POA: Diagnosis not present

## 2018-11-10 DIAGNOSIS — G373 Acute transverse myelitis in demyelinating disease of central nervous system: Secondary | ICD-10-CM | POA: Diagnosis not present

## 2018-11-10 DIAGNOSIS — I2699 Other pulmonary embolism without acute cor pulmonale: Secondary | ICD-10-CM | POA: Diagnosis not present

## 2018-11-10 LAB — PROTIME-INR
INR: 1.4 — ABNORMAL HIGH (ref 0.8–1.2)
Prothrombin Time: 16.8 seconds — ABNORMAL HIGH (ref 11.4–15.2)

## 2018-11-10 LAB — VANCOMYCIN, TROUGH: Vancomycin Tr: 22 ug/mL (ref 15–20)

## 2018-11-10 NOTE — Progress Notes (Addendum)
Pulmonary Critical Care Medicine [place]   PULMONARY CRITICAL CARE SERVICE  PROGRESS NOTE  Date of Service: 11/10/2018  Benjamin Montoya  HDQ:[id number]  DOB: 1970/12/05   DOA: 10/21/2018  Referring Physician: Merton Border, MD  HPI: Benjamin Montoya is a 48 y.o. male seen for follow up of Acute on Chronic Respiratory Failure.  Patient is on aerosol trach collar 28% FiO2 using PMV during the day with no difficulty.  Resting on vent at night.  Medications: Reviewed on Rounds  Physical Exam:  Vitals: Pulse 68 respirations 10 BP 95/48 O2 sat 100% temp 96.6  Ventilator Settings ATC 20%  . General: Comfortable at this time . Eyes: Grossly normal lids, irises & conjunctiva . ENT: grossly tongue is normal . Neck: no obvious mass . Cardiovascular: S1 S2 normal no gallop . Respiratory: No rales or rhonchi noted . Abdomen: soft . Skin: no rash seen on limited exam . Musculoskeletal: not rigid . Psychiatric:unable to assess . Neurologic: no seizure no involuntary movements         Lab Data:   Basic Metabolic Panel: Recent Labs  Lab 11/04/18 0557 11/07/18 0539 11/08/18 1106  NA 138 133* 136  K 3.5 4.0 3.7  CL 99 97* 99  CO2 26 24 26   GLUCOSE 101* 112* 107*  BUN 14 20 14   CREATININE 0.37* 0.36* 0.41*  CALCIUM 9.7 9.3 9.7  MG  --   --  2.0    ABG: No results for input(s): PHART, PCO2ART, PO2ART, HCO3, O2SAT in the last 168 hours.  Liver Function Tests: No results for input(s): AST, ALT, ALKPHOS, BILITOT, PROT, ALBUMIN in the last 168 hours. No results for input(s): LIPASE, AMYLASE in the last 168 hours. No results for input(s): AMMONIA in the last 168 hours.  CBC: Recent Labs  Lab 11/04/18 0557 11/06/18 1552 11/07/18 0539 11/08/18 1106 11/09/18 0542  WBC 9.8 13.8* 10.4 10.3 7.4  HGB 11.4* 11.5* 10.8* 9.9* 8.6*  HCT 38.1* 37.5* 36.3* 33.1* 28.6*  MCV 84.9 84.1 85.0 85.5 84.4  PLT 247 383 304 334 288    Cardiac Enzymes: No results for  input(s): CKTOTAL, CKMB, CKMBINDEX, TROPONINI in the last 168 hours.  BNP (last 3 results) No results for input(s): BNP in the last 8760 hours.  ProBNP (last 3 results) No results for input(s): PROBNP in the last 8760 hours.  Radiological Exams: No results found.  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 Acute on chronic respiratory failure with hypoxia continue T collar during daytime rest on the ventilator at nighttime. 2. Acute transverse myelitis grossly unchanged continue with supportive care 3. Healthcare associated pneumonia treated resolved 4. Pulmonary embolism at baseline treated continue with supportive care   I have personally seen and evaluated the patient, evaluated laboratory and imaging results, formulated the assessment and plan and placed orders. The Patient requires high complexity decision making for assessment and support.  Case was discussed on Rounds with the Respiratory Therapy Staff  Allyne Gee, MD [facility] Pulmonary Critical Care Medicine Sleep Medicine

## 2018-11-11 DIAGNOSIS — I2699 Other pulmonary embolism without acute cor pulmonale: Secondary | ICD-10-CM | POA: Diagnosis not present

## 2018-11-11 DIAGNOSIS — J189 Pneumonia, unspecified organism: Secondary | ICD-10-CM | POA: Diagnosis not present

## 2018-11-11 DIAGNOSIS — G373 Acute transverse myelitis in demyelinating disease of central nervous system: Secondary | ICD-10-CM | POA: Diagnosis not present

## 2018-11-11 DIAGNOSIS — J9621 Acute and chronic respiratory failure with hypoxia: Secondary | ICD-10-CM | POA: Diagnosis not present

## 2018-11-11 LAB — PROTIME-INR
INR: 1.5 — ABNORMAL HIGH (ref 0.8–1.2)
Prothrombin Time: 18.1 seconds — ABNORMAL HIGH (ref 11.4–15.2)

## 2018-11-11 NOTE — Progress Notes (Signed)
Pulmonary Critical Care Medicine [place]   PULMONARY CRITICAL CARE SERVICE  PROGRESS NOTE  Date of Service: 11/11/2018  Benjamin Montoya  DGU:[id number]  DOB: 1970-04-11   DOA: 10/21/2018  Referring Physician: Merton Border, MD  HPI: Benjamin Montoya is a 48 y.o. male seen for follow up of Acute on Chronic Respiratory Failure.  Patient currently is on T collar doing fairly well.  Has been on 28% FiO2 with the PMV in place using on the ventilator at nighttime  Medications: Reviewed on Rounds  Physical Exam:  Vitals: Temperature 97.2 pulse 66 respiratory rate 10 blood pressure 96/49 saturations 100%  Ventilator Settings off ventilator T collar right now  . General: Comfortable at this time . Eyes: Grossly normal lids, irises & conjunctiva . ENT: grossly tongue is normal . Neck: no obvious mass . Cardiovascular: S1 S2 normal no gallop . Respiratory: No rhonchi no rales are noted at this time . Abdomen: soft . Skin: no rash seen on limited exam . Musculoskeletal: not rigid . Psychiatric:unable to assess . Neurologic: no seizure no involuntary movements         Lab Data:   Basic Metabolic Panel: Recent Labs  Lab 11/07/18 0539 11/08/18 1106  NA 133* 136  K 4.0 3.7  CL 97* 99  CO2 24 26  GLUCOSE 112* 107*  BUN 20 14  CREATININE 0.36* 0.41*  CALCIUM 9.3 9.7  MG  --  2.0    ABG: No results for input(s): PHART, PCO2ART, PO2ART, HCO3, O2SAT in the last 168 hours.  Liver Function Tests: No results for input(s): AST, ALT, ALKPHOS, BILITOT, PROT, ALBUMIN in the last 168 hours. No results for input(s): LIPASE, AMYLASE in the last 168 hours. No results for input(s): AMMONIA in the last 168 hours.  CBC: Recent Labs  Lab 11/06/18 1552 11/07/18 0539 11/08/18 1106 11/09/18 0542  WBC 13.8* 10.4 10.3 7.4  HGB 11.5* 10.8* 9.9* 8.6*  HCT 37.5* 36.3* 33.1* 28.6*  MCV 84.1 85.0 85.5 84.4  PLT 383 304 334 288    Cardiac Enzymes: No results for  input(s): CKTOTAL, CKMB, CKMBINDEX, TROPONINI in the last 168 hours.  BNP (last 3 results) No results for input(s): BNP in the last 8760 hours.  ProBNP (last 3 results) No results for input(s): PROBNP in the last 8760 hours.  Radiological Exams: No results found.  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 Acute on chronic respiratory failure with hypoxia continue T collar during daytime rest on the ventilator at nighttime. 2. Acute transverse myelitis grossly unchanged continue with supportive care 3. Healthcare associated pneumonia treated resolved 4. Pulmonary embolism at baseline treated continue with supportive care   I have personally seen and evaluated the patient, evaluated laboratory and imaging results, formulated the assessment and plan and placed orders. The Patient requires high complexity decision making for assessment and support.  Case was discussed on Rounds with the Respiratory Therapy Staff  Allyne Gee, MD [facility] Pulmonary Critical Care Medicine Sleep Medicine

## 2018-11-12 ENCOUNTER — Other Ambulatory Visit (HOSPITAL_COMMUNITY): Payer: 59

## 2018-11-12 DIAGNOSIS — J9621 Acute and chronic respiratory failure with hypoxia: Secondary | ICD-10-CM | POA: Diagnosis not present

## 2018-11-12 DIAGNOSIS — I2699 Other pulmonary embolism without acute cor pulmonale: Secondary | ICD-10-CM | POA: Diagnosis not present

## 2018-11-12 DIAGNOSIS — J189 Pneumonia, unspecified organism: Secondary | ICD-10-CM | POA: Diagnosis not present

## 2018-11-12 DIAGNOSIS — G373 Acute transverse myelitis in demyelinating disease of central nervous system: Secondary | ICD-10-CM | POA: Diagnosis not present

## 2018-11-12 LAB — PROTIME-INR
INR: 1.6 — ABNORMAL HIGH (ref 0.8–1.2)
Prothrombin Time: 18.8 seconds — ABNORMAL HIGH (ref 11.4–15.2)

## 2018-11-12 NOTE — Progress Notes (Addendum)
Pulmonary Critical Care Medicine [place]   PULMONARY CRITICAL CARE SERVICE  PROGRESS NOTE  Date of Service: 11/12/2018  Benjamin Montoya  [id number]:[id number]  DOB: 28-Sep-1970   DOA: 10/21/2018  Referring Physician: Merton Border, MD  HPI: Benjamin Montoya is a 48 y.o. male seen for follow up of Acute on Chronic Respiratory Failure.  Patient mains on aerosol trach collar 28% FiO2's using PMV during the day resting on the vent at night.  Medications: Reviewed on Rounds  Physical Exam:  Vitals: Pulse 60 respirations 18 BP 115/65 O2 sat 100% temp 97.4  Ventilator Settings ATC 20%  . General: Comfortable at this time . Eyes: Grossly normal lids, irises & conjunctiva . ENT: grossly tongue is normal . Neck: no obvious mass . Cardiovascular: S1 S2 normal no gallop . Respiratory: No rales or rhonchi noted . Abdomen: soft . Skin: no rash seen on limited exam . Musculoskeletal: not rigid . Psychiatric:unable to assess . Neurologic: no seizure no involuntary movements         Lab Data:   Basic Metabolic Panel: Recent Labs  Lab 11/07/18 0539 11/08/18 1106  NA 133* 136  K 4.0 3.7  CL 97* 99  CO2 24 26  GLUCOSE 112* 107*  BUN 20 14  CREATININE 0.36* 0.41*  CALCIUM 9.3 9.7  MG  --  2.0    ABG: No results for input(s): PHART, PCO2ART, PO2ART, HCO3, O2SAT in the last 168 hours.  Liver Function Tests: No results for input(s): AST, ALT, ALKPHOS, BILITOT, PROT, ALBUMIN in the last 168 hours. No results for input(s): LIPASE, AMYLASE in the last 168 hours. No results for input(s): AMMONIA in the last 168 hours.  CBC: Recent Labs  Lab 11/06/18 1552 11/07/18 0539 11/08/18 1106 11/09/18 0542  WBC 13.8* 10.4 10.3 7.4  HGB 11.5* 10.8* 9.9* 8.6*  HCT 37.5* 36.3* 33.1* 28.6*  MCV 84.1 85.0 85.5 84.4  PLT 383 304 334 288    Cardiac Enzymes: No results for input(s): CKTOTAL, CKMB, CKMBINDEX, TROPONINI in the last 168 hours.  BNP (last 3 results) No results  for input(s): BNP in the last 8760 hours.  ProBNP (last 3 results) No results for input(s): PROBNP in the last 8760 hours.  Radiological Exams: No results found.  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 Acute on chronic respiratory failure with hypoxia continue T collar during daytime rest on the ventilator at nighttime. 2. Acute transverse myelitis grossly unchanged continue with supportive care 3. Healthcare associated pneumonia treated resolved 4. Pulmonary embolism at baseline treated continue with supportive care   I have personally seen and evaluated the patient, evaluated laboratory and imaging results, formulated the assessment and plan and placed orders. The Patient requires high complexity decision making for assessment and support.  Case was discussed on Rounds with the Respiratory Therapy Staff  Allyne Gee, MD [facility] Pulmonary Critical Care Medicine Sleep Medicine

## 2018-11-13 ENCOUNTER — Other Ambulatory Visit (HOSPITAL_COMMUNITY): Payer: 59

## 2018-11-13 DIAGNOSIS — G373 Acute transverse myelitis in demyelinating disease of central nervous system: Secondary | ICD-10-CM | POA: Diagnosis not present

## 2018-11-13 DIAGNOSIS — I2699 Other pulmonary embolism without acute cor pulmonale: Secondary | ICD-10-CM | POA: Diagnosis not present

## 2018-11-13 DIAGNOSIS — J9621 Acute and chronic respiratory failure with hypoxia: Secondary | ICD-10-CM | POA: Diagnosis not present

## 2018-11-13 DIAGNOSIS — J189 Pneumonia, unspecified organism: Secondary | ICD-10-CM | POA: Diagnosis not present

## 2018-11-13 LAB — BASIC METABOLIC PANEL
Anion gap: 10 (ref 5–15)
BUN: 11 mg/dL (ref 6–20)
CO2: 27 mmol/L (ref 22–32)
Calcium: 9.5 mg/dL (ref 8.9–10.3)
Chloride: 105 mmol/L (ref 98–111)
Creatinine, Ser: 0.34 mg/dL — ABNORMAL LOW (ref 0.61–1.24)
GFR calc Af Amer: >60 mL/min (ref >60)
GFR calc non Af Amer: >60 mL/min (ref >60)
Glucose, Bld: 86 mg/dL (ref 70–99)
Potassium: 3.8 mmol/L (ref 3.5–5.1)
Sodium: 142 mmol/L (ref 135–145)

## 2018-11-13 LAB — CBC
HCT: 27.5 % — ABNORMAL LOW (ref 39.0–52.0)
Hemoglobin: 8.2 g/dL — ABNORMAL LOW (ref 13.0–17.0)
MCH: 25.6 pg — ABNORMAL LOW (ref 26.0–34.0)
MCHC: 29.8 g/dL — ABNORMAL LOW (ref 30.0–36.0)
MCV: 85.9 fL (ref 80.0–100.0)
Platelets: 350 10*3/uL (ref 150–400)
RBC: 3.2 MIL/uL — ABNORMAL LOW (ref 4.22–5.81)
RDW: 17 % — ABNORMAL HIGH (ref 11.5–15.5)
WBC: 8.3 10*3/uL (ref 4.0–10.5)
nRBC: 0 % (ref 0.0–0.2)

## 2018-11-13 LAB — PROTIME-INR
INR: 2 — ABNORMAL HIGH (ref 0.8–1.2)
Prothrombin Time: 22 seconds — ABNORMAL HIGH (ref 11.4–15.2)

## 2018-11-13 NOTE — Progress Notes (Addendum)
Pulmonary Critical Care Medicine [place]   PULMONARY CRITICAL CARE SERVICE  PROGRESS NOTE  Date of Service: 11/13/2018  Benjamin Montoya  [id number]:[id number]  DOB: October 26, 1970   DOA: 10/21/2018  Referring Physician: Merton Border, MD  HPI: Benjamin Montoya is a 48 y.o. male seen for follow up of Acute on Chronic Respiratory Failure.  Patient remains on his trach collar 28% during the day resting on the vent at night.  Medications: Reviewed on Rounds  Physical Exam:  Vitals: Pulse 69 respirations 13 BP 114/65 O2 sat 99% temp 97.0  Ventilator Settings 28% ATC  . General: Comfortable at this time . Eyes: Grossly normal lids, irises & conjunctiva . ENT: grossly tongue is normal . Neck: no obvious mass . Cardiovascular: S1 S2 normal no gallop . Respiratory: No rales or rhonchi noted . Abdomen: soft . Skin: no rash seen on limited exam . Musculoskeletal: not rigid . Psychiatric:unable to assess . Neurologic: no seizure no involuntary movements         Lab Data:   Basic Metabolic Panel: Recent Labs  Lab 11/07/18 0539 11/08/18 1106 11/13/18 0343  NA 133* 136 142  K 4.0 3.7 3.8  CL 97* 99 105  CO2 24 26 27   GLUCOSE 112* 107* 86  BUN 20 14 11   CREATININE 0.36* 0.41* 0.34*  CALCIUM 9.3 9.7 9.5  MG  --  2.0  --     ABG: No results for input(s): PHART, PCO2ART, PO2ART, HCO3, O2SAT in the last 168 hours.  Liver Function Tests: No results for input(s): AST, ALT, ALKPHOS, BILITOT, PROT, ALBUMIN in the last 168 hours. No results for input(s): LIPASE, AMYLASE in the last 168 hours. No results for input(s): AMMONIA in the last 168 hours.  CBC: Recent Labs  Lab 11/07/18 0539 11/08/18 1106 11/09/18 0542 11/13/18 0343  WBC 10.4 10.3 7.4 8.3  HGB 10.8* 9.9* 8.6* 8.2*  HCT 36.3* 33.1* 28.6* 27.5*  MCV 85.0 85.5 84.4 85.9  PLT 304 334 288 350    Cardiac Enzymes: No results for input(s): CKTOTAL, CKMB, CKMBINDEX, TROPONINI in the last 168 hours.  BNP  (last 3 results) No results for input(s): BNP in the last 8760 hours.  ProBNP (last 3 results) No results for input(s): PROBNP in the last 8760 hours.  Radiological Exams: Dg Chest Port 1 View  Result Date: 11/13/2018 CLINICAL DATA:  History of pneumonia EXAM: PORTABLE CHEST 1 VIEW COMPARISON:  Chest radiograph 11/07/2018 FINDINGS: Monitoring leads overlie the patient. Tracheostomy tube mid trachea. Enlarged cardiac and mediastinal contours. Bibasilar heterogeneous opacities. No pleural effusion or pneumothorax. IMPRESSION: Bibasilar opacities favored to represent atelectasis. Electronically Signed   By: Lovey Newcomer M.D.   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 Acute on chronic respiratory failure with hypoxia continue T collar during daytime rest on the ventilator at nighttime. 2. Acute transverse myelitis grossly unchanged continue with supportive care 3. Healthcare associated pneumonia treated resolved 4. Pulmonary embolism at baseline treated continue with supportive care   I have personally seen and evaluated the patient, evaluated laboratory and imaging results, formulated the assessment and plan and placed orders. The Patient requires high complexity decision making for assessment and support.  Case was discussed on Rounds with the Respiratory Therapy Staff  Allyne Gee, MD  [facility] Pulmonary Critical Care Medicine Sleep Medicine

## 2018-11-14 DIAGNOSIS — I2699 Other pulmonary embolism without acute cor pulmonale: Secondary | ICD-10-CM | POA: Diagnosis not present

## 2018-11-14 DIAGNOSIS — G373 Acute transverse myelitis in demyelinating disease of central nervous system: Secondary | ICD-10-CM | POA: Diagnosis not present

## 2018-11-14 DIAGNOSIS — J9621 Acute and chronic respiratory failure with hypoxia: Secondary | ICD-10-CM | POA: Diagnosis not present

## 2018-11-14 DIAGNOSIS — J189 Pneumonia, unspecified organism: Secondary | ICD-10-CM | POA: Diagnosis not present

## 2018-11-14 LAB — PROTIME-INR
INR: 2.3 — ABNORMAL HIGH (ref 0.8–1.2)
Prothrombin Time: 25.1 seconds — ABNORMAL HIGH (ref 11.4–15.2)

## 2018-11-14 NOTE — Progress Notes (Addendum)
Pulmonary Critical Care Medicine [place]   PULMONARY CRITICAL CARE SERVICE  PROGRESS NOTE  Date of Service: 11/14/2018  Benjamin Montoya  EPP:[id number]  DOB: 15-Aug-1970   DOA: 10/21/2018  Referring Physician: Merton Border, MD  HPI: Benjamin Montoya is a 48 y.o. male seen for follow up of Acute on Chronic Respiratory Failure.  Patient remains on aerosol trach collar a 25% of the night.  Medications: Reviewed on Rounds  Physical Exam:  Vitals: Pulse 75 respirations 10 BP 97/55 O2 sat 99% temp 97.4  Ventilator Settings ATC 28%  . General: Comfortable at this time . Eyes: Grossly normal lids, irises & conjunctiva . ENT: grossly tongue is normal . Neck: no obvious mass . Cardiovascular: S1 S2 normal no gallop . Respiratory: No rales or rhonchi noted . Abdomen: soft . Skin: no rash seen on limited exam . Musculoskeletal: not rigid . Psychiatric:unable to assess . Neurologic: no seizure no involuntary movements         Lab Data:   Basic Metabolic Panel: Recent Labs  Lab 11/08/18 1106 11/13/18 0343  NA 136 142  K 3.7 3.8  CL 99 105  CO2 26 27  GLUCOSE 107* 86  BUN 14 11  CREATININE 0.41* 0.34*  CALCIUM 9.7 9.5  MG 2.0  --     ABG: No results for input(s): PHART, PCO2ART, PO2ART, HCO3, O2SAT in the last 168 hours.  Liver Function Tests: No results for input(s): AST, ALT, ALKPHOS, BILITOT, PROT, ALBUMIN in the last 168 hours. No results for input(s): LIPASE, AMYLASE in the last 168 hours. No results for input(s): AMMONIA in the last 168 hours.  CBC: Recent Labs  Lab 11/08/18 1106 11/09/18 0542 11/13/18 0343  WBC 10.3 7.4 8.3  HGB 9.9* 8.6* 8.2*  HCT 33.1* 28.6* 27.5*  MCV 85.5 84.4 85.9  PLT 334 288 350    Cardiac Enzymes: No results for input(s): CKTOTAL, CKMB, CKMBINDEX, TROPONINI in the last 168 hours.  BNP (last 3 results) No results for input(s): BNP in the last 8760 hours.  ProBNP (last 3 results) No results for input(s):  PROBNP in the last 8760 hours.  Radiological Exams: Dg Chest Port 1 View  Result Date: 11/13/2018 CLINICAL DATA:  History of pneumonia EXAM: PORTABLE CHEST 1 VIEW COMPARISON:  Chest radiograph 11/07/2018 FINDINGS: Monitoring leads overlie the patient. Tracheostomy tube mid trachea. Enlarged cardiac and mediastinal contours. Bibasilar heterogeneous opacities. No pleural effusion or pneumothorax. IMPRESSION: Bibasilar opacities favored to represent atelectasis. Electronically Signed   By: Lovey Newcomer M.D.   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 Acute on chronic respiratory failure with hypoxia continue T collar during daytime rest on the ventilator at nighttime. 2. Acute transverse myelitis grossly unchanged continue with supportive care 3. Healthcare associated pneumonia treated resolved 4. Pulmonary embolism at baseline treated continue with supportive care   I have personally seen and evaluated the patient, evaluated laboratory and imaging results, formulated the assessment and plan and placed orders. The Patient requires high complexity decision making for assessment and support.  Case was discussed on Rounds with the Respiratory Therapy Staff  Allyne Gee, MD [facility] Pulmonary Critical Care Medicine Sleep Medicine

## 2018-11-15 DIAGNOSIS — I2699 Other pulmonary embolism without acute cor pulmonale: Secondary | ICD-10-CM | POA: Diagnosis not present

## 2018-11-15 DIAGNOSIS — J189 Pneumonia, unspecified organism: Secondary | ICD-10-CM | POA: Diagnosis not present

## 2018-11-15 DIAGNOSIS — J9621 Acute and chronic respiratory failure with hypoxia: Secondary | ICD-10-CM | POA: Diagnosis not present

## 2018-11-15 DIAGNOSIS — G373 Acute transverse myelitis in demyelinating disease of central nervous system: Secondary | ICD-10-CM | POA: Diagnosis not present

## 2018-11-15 LAB — PROTIME-INR
INR: 2.3 — ABNORMAL HIGH (ref 0.8–1.2)
Prothrombin Time: 25 seconds — ABNORMAL HIGH (ref 11.4–15.2)

## 2018-11-15 NOTE — Progress Notes (Signed)
Pulmonary Critical Care Medicine [place]   PULMONARY CRITICAL CARE SERVICE  PROGRESS NOTE  Date of Service: 11/15/2018  Benjamin Montoya  [id number]:[id number]  DOB: [birth year]   DOA: 10/21/2018  Referring Physician: Merton Border, MD  HPI: Benjamin Montoya is a 48 y.o. male seen for follow up of Acute on Chronic Respiratory Failure.  Patient is on T collar currently without distress.  Right now is on 20% FiO2 using PMV  Medications: Reviewed on Rounds  Physical Exam:  Vitals: Temperature 97.3 pulse 66 respiratory 18 blood pressure 112/66 saturations 100%  Ventilator Settings off ventilator on T collar with PMV  . General: Comfortable at this time . Eyes: Grossly normal lids, irises & conjunctiva . ENT: grossly tongue is normal . Neck: no obvious mass . Cardiovascular: S1 S2 normal no gallop . Respiratory: No rhonchi no rales are noted at this time . Abdomen: soft . Skin: no rash seen on limited exam . Musculoskeletal: not rigid . Psychiatric:unable to assess . Neurologic: no seizure no involuntary movements         Lab Data:   Basic Metabolic Panel: Recent Labs  Lab 11/08/18 1106 11/13/18 0343  NA 136 142  K 3.7 3.8  CL 99 105  CO2 26 27  GLUCOSE 107* 86  BUN 14 11  CREATININE 0.41* 0.34*  CALCIUM 9.7 9.5  MG 2.0  --     ABG: No results for input(s): PHART, PCO2ART, PO2ART, HCO3, O2SAT in the last 168 hours.  Liver Function Tests: No results for input(s): AST, ALT, ALKPHOS, BILITOT, PROT, ALBUMIN in the last 168 hours. No results for input(s): LIPASE, AMYLASE in the last 168 hours. No results for input(s): AMMONIA in the last 168 hours.  CBC: Recent Labs  Lab 11/08/18 1106 11/09/18 0542 11/13/18 0343  WBC 10.3 7.4 8.3  HGB 9.9* 8.6* 8.2*  HCT 33.1* 28.6* 27.5*  MCV 85.5 84.4 85.9  PLT 334 288 350    Cardiac Enzymes: No results for input(s): CKTOTAL, CKMB, CKMBINDEX, TROPONINI in the last 168 hours.  BNP (last 3 results) No  results for input(s): BNP in the last 8760 hours.  ProBNP (last 3 results) No results for input(s): PROBNP in the last 8760 hours.  Radiological Exams: No results found.  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 Acute on chronic respiratory failure with hypoxia we will continue with T collar trials as ordered and now with the PMV in place. 2. Acute transverse myelitis unchanged 3. Healthcare associated pneumonia treated resolved 4. Pulmonary embolism patient is at baseline   I have personally seen and evaluated the patient, evaluated laboratory and imaging results, formulated the assessment and plan and placed orders. The Patient requires high complexity decision making for assessment and support.  Case was discussed on Rounds with the Respiratory Therapy Staff  Allyne Gee, MD [facility] Pulmonary Critical Care Medicine Sleep Medicine

## 2018-11-16 DIAGNOSIS — J189 Pneumonia, unspecified organism: Secondary | ICD-10-CM | POA: Diagnosis not present

## 2018-11-16 DIAGNOSIS — J9621 Acute and chronic respiratory failure with hypoxia: Secondary | ICD-10-CM | POA: Diagnosis not present

## 2018-11-16 DIAGNOSIS — G373 Acute transverse myelitis in demyelinating disease of central nervous system: Secondary | ICD-10-CM | POA: Diagnosis not present

## 2018-11-16 DIAGNOSIS — I2699 Other pulmonary embolism without acute cor pulmonale: Secondary | ICD-10-CM | POA: Diagnosis not present

## 2018-11-16 LAB — PROTIME-INR
INR: 2.9 — ABNORMAL HIGH (ref 0.8–1.2)
Prothrombin Time: 29.8 seconds — ABNORMAL HIGH (ref 11.4–15.2)

## 2018-11-16 NOTE — Progress Notes (Signed)
Pulmonary Critical Care Medicine [place]   PULMONARY CRITICAL CARE SERVICE  PROGRESS NOTE  Date of Service: 11/16/2018  Benjamin Montoya  [id number]:[id number]  DOB: 1970/05/30   DOA: 10/21/2018  Referring Physician: Merton Border, MD  HPI: Benjamin Montoya is a 48 y.o. male seen for follow up of Acute on Chronic Respiratory Failure.  Patient currently is on T collar has been not tolerating it well resting on the ventilator at nighttime.  Medications: Reviewed on Rounds  Physical Exam:  Vitals: Temperature 96.8 pulse 57 respiratory 18 blood pressure 126/65 saturations 100%  Ventilator Settings currently off the ventilator  . General: Comfortable at this time . Eyes: Grossly normal lids, irises & conjunctiva . ENT: grossly tongue is normal . Neck: no obvious mass . Cardiovascular: S1 S2 normal no gallop . Respiratory: No rhonchi no rales are noted . Abdomen: soft . Skin: no rash seen on limited exam . Musculoskeletal: not rigid . Psychiatric:unable to assess . Neurologic: no seizure no involuntary movements         Lab Data:   Basic Metabolic Panel: Recent Labs  Lab 11/13/18 0343  NA 142  K 3.8  CL 105  CO2 27  GLUCOSE 86  BUN 11  CREATININE 0.34*  CALCIUM 9.5    ABG: No results for input(s): PHART, PCO2ART, PO2ART, HCO3, O2SAT in the last 168 hours.  Liver Function Tests: No results for input(s): AST, ALT, ALKPHOS, BILITOT, PROT, ALBUMIN in the last 168 hours. No results for input(s): LIPASE, AMYLASE in the last 168 hours. No results for input(s): AMMONIA in the last 168 hours.  CBC: Recent Labs  Lab 11/13/18 0343  WBC 8.3  HGB 8.2*  HCT 27.5*  MCV 85.9  PLT 350    Cardiac Enzymes: No results for input(s): CKTOTAL, CKMB, CKMBINDEX, TROPONINI in the last 168 hours.  BNP (last 3 results) No results for input(s): BNP in the last 8760 hours.  ProBNP (last 3 results) No results for input(s): PROBNP in the last 8760  hours.  Radiological Exams: No results found.  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 Acute on chronic respiratory failure with hypoxia we will continue with T collar trials titrate oxygen continue pulmonary toilet. 2. Acute transverse myelitis we will continue with present management 3. Healthcare associated pneumonia treated resolved 4. Pulmonary embolism patient is at baseline   I have personally seen and evaluated the patient, evaluated laboratory and imaging results, formulated the assessment and plan and placed orders. The Patient requires high complexity decision making for assessment and support.  Case was discussed on Rounds with the Respiratory Therapy Staff  Allyne Gee, MD [facility] Pulmonary Critical Care Medicine Sleep Medicine

## 2018-11-17 DIAGNOSIS — G373 Acute transverse myelitis in demyelinating disease of central nervous system: Secondary | ICD-10-CM | POA: Diagnosis not present

## 2018-11-17 DIAGNOSIS — I2699 Other pulmonary embolism without acute cor pulmonale: Secondary | ICD-10-CM | POA: Diagnosis not present

## 2018-11-17 DIAGNOSIS — J9621 Acute and chronic respiratory failure with hypoxia: Secondary | ICD-10-CM | POA: Diagnosis not present

## 2018-11-17 DIAGNOSIS — J189 Pneumonia, unspecified organism: Secondary | ICD-10-CM | POA: Diagnosis not present

## 2018-11-17 LAB — PROTIME-INR
INR: 3.1 — ABNORMAL HIGH (ref 0.8–1.2)
Prothrombin Time: 31.2 seconds — ABNORMAL HIGH (ref 11.4–15.2)

## 2018-11-17 NOTE — Progress Notes (Addendum)
Pulmonary Critical Care Medicine [place]   PULMONARY CRITICAL CARE SERVICE  PROGRESS NOTE  Date of Service: 11/17/2018  Benjamin Montoya  WLN:[id number]  DOB: 1970-09-25   DOA: 10/21/2018  Referring Physician: Merton Border, MD  HPI: Benjamin Montoya is a 48 y.o. male seen for follow up of Acute on Chronic Respiratory Failure.  Patient remains on aerosol trach collar 28% during the day satting well at this time.  Medications: Reviewed on Rounds  Physical Exam:  Vitals: Pulse 72 respirations 18 BP 112/67 O2 sat 100% temp 6.8  Ventilator Settings ATC 28%.  . General: Comfortable at this time . Eyes: Grossly normal lids, irises & conjunctiva . ENT: grossly tongue is normal . Neck: no obvious mass . Cardiovascular: S1 S2 normal no gallop . Respiratory: No rales or rhonchi noted . Abdomen: soft . Skin: no rash seen on limited exam . Musculoskeletal: not rigid . Psychiatric:unable to assess . Neurologic: no seizure no involuntary movements         Lab Data:   Basic Metabolic Panel: Recent Labs  Lab 11/13/18 0343  NA 142  K 3.8  CL 105  CO2 27  GLUCOSE 86  BUN 11  CREATININE 0.34*  CALCIUM 9.5    ABG: No results for input(s): PHART, PCO2ART, PO2ART, HCO3, O2SAT in the last 168 hours.  Liver Function Tests: No results for input(s): AST, ALT, ALKPHOS, BILITOT, PROT, ALBUMIN in the last 168 hours. No results for input(s): LIPASE, AMYLASE in the last 168 hours. No results for input(s): AMMONIA in the last 168 hours.  CBC: Recent Labs  Lab 11/13/18 0343  WBC 8.3  HGB 8.2*  HCT 27.5*  MCV 85.9  PLT 350    Cardiac Enzymes: No results for input(s): CKTOTAL, CKMB, CKMBINDEX, TROPONINI in the last 168 hours.  BNP (last 3 results) No results for input(s): BNP in the last 8760 hours.  ProBNP (last 3 results) No results for input(s): PROBNP in the last 8760 hours.  Radiological Exams: No results found.  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 Acute on chronic respiratory failure with hypoxia we will continue with T collar trials titrate oxygen continue pulmonary toilet. 2. Acute transverse myelitis we will continue with present management 3. Healthcare associated pneumonia treated resolved 4. Pulmonary embolism patient is at baseline   I have personally seen and evaluated the patient, evaluated laboratory and imaging results, formulated the assessment and plan and placed orders. The Patient requires high complexity decision making for assessment and support.  Case was discussed on Rounds with the Respiratory Therapy Staff  Allyne Gee, MD [facility] Pulmonary Critical Care Medicine Sleep Medicine

## 2018-11-18 DIAGNOSIS — J9621 Acute and chronic respiratory failure with hypoxia: Secondary | ICD-10-CM | POA: Diagnosis not present

## 2018-11-18 DIAGNOSIS — J189 Pneumonia, unspecified organism: Secondary | ICD-10-CM | POA: Diagnosis not present

## 2018-11-18 DIAGNOSIS — I2699 Other pulmonary embolism without acute cor pulmonale: Secondary | ICD-10-CM | POA: Diagnosis not present

## 2018-11-18 DIAGNOSIS — G373 Acute transverse myelitis in demyelinating disease of central nervous system: Secondary | ICD-10-CM | POA: Diagnosis not present

## 2018-11-18 LAB — PROTIME-INR
INR: 2.5 — ABNORMAL HIGH (ref 0.8–1.2)
Prothrombin Time: 26.4 seconds — ABNORMAL HIGH (ref 11.4–15.2)

## 2018-11-18 NOTE — Progress Notes (Signed)
Pulmonary Critical Care Medicine [place]   PULMONARY CRITICAL CARE SERVICE  PROGRESS NOTE  Date of Service: 11/18/2018  Alta Shober  PPJ:[id number]  DOB: Mar 29, 1970   DOA: 10/21/2018  Referring Physician: Merton Border, MD  HPI: Meldon Hanzlik is a 48 y.o. male seen for follow up of Acute on Chronic Respiratory Failure.  Patient currently is on T collar he is doing well right now is on 28% FiO2  Medications: Reviewed on Rounds  Physical Exam:  Vitals: Temperature 96.6 pulse 66 respiratory 18 blood pressure 130/68 saturations 100%  Ventilator Settings off the ventilator right now on T collar  . General: Comfortable at this time . Eyes: Grossly normal lids, irises & conjunctiva . ENT: grossly tongue is normal . Neck: no obvious mass . Cardiovascular: S1 S2 normal no gallop . Respiratory: No rhonchi no rales are noted at this time . Abdomen: soft . Skin: no rash seen on limited exam . Musculoskeletal: not rigid . Psychiatric:unable to assess . Neurologic: no seizure no involuntary movements         Lab Data:   Basic Metabolic Panel: Recent Labs  Lab 11/13/18 0343  NA 142  K 3.8  CL 105  CO2 27  GLUCOSE 86  BUN 11  CREATININE 0.34*  CALCIUM 9.5    ABG: No results for input(s): PHART, PCO2ART, PO2ART, HCO3, O2SAT in the last 168 hours.  Liver Function Tests: No results for input(s): AST, ALT, ALKPHOS, BILITOT, PROT, ALBUMIN in the last 168 hours. No results for input(s): LIPASE, AMYLASE in the last 168 hours. No results for input(s): AMMONIA in the last 168 hours.  CBC: Recent Labs  Lab 11/13/18 0343  WBC 8.3  HGB 8.2*  HCT 27.5*  MCV 85.9  PLT 350    Cardiac Enzymes: No results for input(s): CKTOTAL, CKMB, CKMBINDEX, TROPONINI in the last 168 hours.  BNP (last 3 results) No results for input(s): BNP in the last 8760 hours.  ProBNP (last 3 results) No results for input(s): PROBNP in the last 8760 hours.  Radiological  Exams: No results found.  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 Acute on chronic respiratory failure with hypoxia we will continue with T collar trials titrate oxygen continue pulmonary toilet. 2. Transverse myelitis continue with supportive care 3. Healthcare associated pneumonia treated 4. Pulmonary embolism at baseline we will continue with present management   I have personally seen and evaluated the patient, evaluated laboratory and imaging results, formulated the assessment and plan and placed orders. The Patient requires high complexity decision making for assessment and support.  Case was discussed on Rounds with the Respiratory Therapy Staff  Allyne Gee, MD [facility] Pulmonary Critical Care Medicine Sleep Medicine

## 2018-11-19 DIAGNOSIS — J9621 Acute and chronic respiratory failure with hypoxia: Secondary | ICD-10-CM | POA: Diagnosis not present

## 2018-11-19 DIAGNOSIS — G373 Acute transverse myelitis in demyelinating disease of central nervous system: Secondary | ICD-10-CM | POA: Diagnosis not present

## 2018-11-19 DIAGNOSIS — J189 Pneumonia, unspecified organism: Secondary | ICD-10-CM | POA: Diagnosis not present

## 2018-11-19 DIAGNOSIS — I2699 Other pulmonary embolism without acute cor pulmonale: Secondary | ICD-10-CM | POA: Diagnosis not present

## 2018-11-19 LAB — BASIC METABOLIC PANEL
Anion gap: 14 (ref 5–15)
BUN: 16 mg/dL (ref 6–20)
CO2: 29 mmol/L (ref 22–32)
Calcium: 9.9 mg/dL (ref 8.9–10.3)
Chloride: 97 mmol/L — ABNORMAL LOW (ref 98–111)
Creatinine, Ser: 0.41 mg/dL — ABNORMAL LOW (ref 0.61–1.24)
GFR calc Af Amer: >60 mL/min (ref >60)
GFR calc non Af Amer: >60 mL/min (ref >60)
Glucose, Bld: 91 mg/dL (ref 70–99)
Potassium: 3.6 mmol/L (ref 3.5–5.1)
Sodium: 140 mmol/L (ref 135–145)

## 2018-11-19 LAB — CBC
HCT: 30.1 % — ABNORMAL LOW (ref 39.0–52.0)
Hemoglobin: 9.1 g/dL — ABNORMAL LOW (ref 13.0–17.0)
MCH: 25.1 pg — ABNORMAL LOW (ref 26.0–34.0)
MCHC: 30.2 g/dL (ref 30.0–36.0)
MCV: 83.1 fL (ref 80.0–100.0)
Platelets: 354 10*3/uL (ref 150–400)
RBC: 3.62 MIL/uL — ABNORMAL LOW (ref 4.22–5.81)
RDW: 16.4 % — ABNORMAL HIGH (ref 11.5–15.5)
WBC: 11.3 10*3/uL — ABNORMAL HIGH (ref 4.0–10.5)
nRBC: 0 % (ref 0.0–0.2)

## 2018-11-19 LAB — PROTIME-INR
INR: 2.2 — ABNORMAL HIGH (ref 0.8–1.2)
Prothrombin Time: 23.7 seconds — ABNORMAL HIGH (ref 11.4–15.2)

## 2018-11-19 NOTE — Progress Notes (Addendum)
Pulmonary Critical Care Medicine [place]   PULMONARY CRITICAL CARE SERVICE  PROGRESS NOTE  Date of Service: 11/19/2018  Benjamin Montoya  ERX:[id number]  DOB: 1970/08/11   DOA: 10/21/2018  Referring Physician: Merton Border, MD  HPI: Benjamin Montoya is a 48 y.o. male seen for follow up of Acute on Chronic Respiratory Failure.  Patient continues on his trach collar 28% FiO2 resting on the ventilator at night using PMV no difficulty.  Medications: Reviewed on Rounds  Physical Exam:  Vitals: Pulse 90 respirations 18 BP 109/56 O2 sat 96% temp 7.7  Ventilator Settings 28% ATC  . General: Comfortable at this time . Eyes: Grossly normal lids, irises & conjunctiva . ENT: grossly tongue is normal . Neck: no obvious mass . Cardiovascular: S1 S2 normal no gallop . Respiratory: No rales or rhonchi noted . Abdomen: soft . Skin: no rash seen on limited exam . Musculoskeletal: not rigid . Psychiatric:unable to assess . Neurologic: no seizure no involuntary movements         Lab Data:   Basic Metabolic Panel: Recent Labs  Lab 11/13/18 0343 11/19/18 0545  NA 142 140  K 3.8 3.6  CL 105 97*  CO2 27 29  GLUCOSE 86 91  BUN 11 16  CREATININE 0.34* 0.41*  CALCIUM 9.5 9.9    ABG: No results for input(s): PHART, PCO2ART, PO2ART, HCO3, O2SAT in the last 168 hours.  Liver Function Tests: No results for input(s): AST, ALT, ALKPHOS, BILITOT, PROT, ALBUMIN in the last 168 hours. No results for input(s): LIPASE, AMYLASE in the last 168 hours. No results for input(s): AMMONIA in the last 168 hours.  CBC: Recent Labs  Lab 11/13/18 0343 11/19/18 0545  WBC 8.3 11.3*  HGB 8.2* 9.1*  HCT 27.5* 30.1*  MCV 85.9 83.1  PLT 350 354    Cardiac Enzymes: No results for input(s): CKTOTAL, CKMB, CKMBINDEX, TROPONINI in the last 168 hours.  BNP (last 3 results) No results for input(s): BNP in the last 8760 hours.  ProBNP (last 3 results) No results for input(s): PROBNP  in the last 8760 hours.  Radiological Exams: No results found.  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 Acute on chronic respiratory failure with hypoxia we will continue with T collar trials titrate oxygen continue pulmonary toilet. 2. Transverse myelitis continue with supportive care 3. Healthcare associated pneumonia treated 4. Pulmonary embolism at baseline we will continue with present management   I have personally seen and evaluated the patient, evaluated laboratory and imaging results, formulated the assessment and plan and placed orders. The Patient requires high complexity decision making for assessment and support.  Case was discussed on Rounds with the Respiratory Therapy Staff  Allyne Gee, MD [facility] Pulmonary Critical Care Medicine Sleep Medicine

## 2018-11-20 DIAGNOSIS — J9621 Acute and chronic respiratory failure with hypoxia: Secondary | ICD-10-CM | POA: Diagnosis not present

## 2018-11-20 DIAGNOSIS — G373 Acute transverse myelitis in demyelinating disease of central nervous system: Secondary | ICD-10-CM | POA: Diagnosis not present

## 2018-11-20 DIAGNOSIS — J189 Pneumonia, unspecified organism: Secondary | ICD-10-CM | POA: Diagnosis not present

## 2018-11-20 DIAGNOSIS — I2699 Other pulmonary embolism without acute cor pulmonale: Secondary | ICD-10-CM | POA: Diagnosis not present

## 2018-11-20 LAB — PROTIME-INR
INR: 2.4 — ABNORMAL HIGH (ref 0.8–1.2)
Prothrombin Time: 25.4 seconds — ABNORMAL HIGH (ref 11.4–15.2)

## 2018-11-20 NOTE — Progress Notes (Addendum)
Pulmonary Critical Care Medicine [name]   PULMONARY CRITICAL CARE SERVICE  PROGRESS NOTE  Date of Service: 11/20/2018  Benjamin Montoya  OAC:[id number]  DOB: [birth year]   DOA: 10/21/2018  Referring Physician: Merton Border, MD  HPI: Benjamin Montoya is a 48 y.o. male seen for follow up of Acute on Chronic Respiratory Failure.  Continue on aerosol trach collar during the day using PMV with no difficulty.  Resting ventilator at night.  Medications: Reviewed on Rounds  Physical Exam:  Vitals: Pulse 62 respirations 18 BP 123/70 O2 sat 96% 7.4  Ventilator Settings ATC 30%  . General: Comfortable at this time . Eyes: Grossly normal lids, irises & conjunctiva . ENT: grossly tongue is normal . Neck: no obvious mass . Cardiovascular: S1 S2 normal no gallop . Respiratory: No rales or rhonchi noted . Abdomen: soft . Skin: no rash seen on limited exam . Musculoskeletal: not rigid . Psychiatric:unable to assess . Neurologic: no seizure no involuntary movements         Lab Data:   Basic Metabolic Panel: Recent Labs  Lab 11/19/18 0545  NA 140  K 3.6  CL 97*  CO2 29  GLUCOSE 91  BUN 16  CREATININE 0.41*  CALCIUM 9.9    ABG: No results for input(s): PHART, PCO2ART, PO2ART, HCO3, O2SAT in the last 168 hours.  Liver Function Tests: No results for input(s): AST, ALT, ALKPHOS, BILITOT, PROT, ALBUMIN in the last 168 hours. No results for input(s): LIPASE, AMYLASE in the last 168 hours. No results for input(s): AMMONIA in the last 168 hours.  CBC: Recent Labs  Lab 11/19/18 0545  WBC 11.3*  HGB 9.1*  HCT 30.1*  MCV 83.1  PLT 354    Cardiac Enzymes: No results for input(s): CKTOTAL, CKMB, CKMBINDEX, TROPONINI in the last 168 hours.  BNP (last 3 results) No results for input(s): BNP in the last 8760 hours.  ProBNP (last 3 results) No results for input(s): PROBNP in the last 8760 hours.  Radiological Exams: No results  found.  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 Acute on chronic respiratory failure with hypoxia we will continue with T collar trials titrate oxygen continue pulmonary toilet. 2. Transverse myelitis continue with supportive care 3. Healthcare associated pneumonia treated 4. Pulmonary embolism at baseline we will continue with present management   I have personally seen and evaluated the patient, evaluated laboratory and imaging results, formulated the assessment and plan and placed orders. The Patient requires high complexity decision making for assessment and support.  Case was discussed on Rounds with the Respiratory Therapy Staff  Allyne Gee, MD [facility] Pulmonary Critical Care Medicine Sleep Medicine

## 2018-11-21 DIAGNOSIS — J9621 Acute and chronic respiratory failure with hypoxia: Secondary | ICD-10-CM | POA: Diagnosis not present

## 2018-11-21 DIAGNOSIS — G373 Acute transverse myelitis in demyelinating disease of central nervous system: Secondary | ICD-10-CM | POA: Diagnosis not present

## 2018-11-21 DIAGNOSIS — I2699 Other pulmonary embolism without acute cor pulmonale: Secondary | ICD-10-CM | POA: Diagnosis not present

## 2018-11-21 DIAGNOSIS — J189 Pneumonia, unspecified organism: Secondary | ICD-10-CM | POA: Diagnosis not present

## 2018-11-21 LAB — PROTIME-INR
INR: 2.6 — ABNORMAL HIGH (ref 0.8–1.2)
Prothrombin Time: 27.6 seconds — ABNORMAL HIGH (ref 11.4–15.2)

## 2018-11-21 NOTE — Progress Notes (Addendum)
Pulmonary Critical Care Medicine [place]   PULMONARY CRITICAL CARE SERVICE  PROGRESS NOTE  Date of Service: 11/21/2018  Benjamin Montoya  [id number]  DOB: 12-25-1970   DOA: 10/21/2018  Referring Physician: Merton Border, MD  HPI: Benjamin Montoya is a 48 y.o. male seen for follow up of Acute on Chronic Respiratory Failure.  Patient continues on aerosol trach collar 28% during the day resting on the vent at night with no fever distress noted.  Medications: Reviewed on Rounds  Physical Exam:  Vitals: Pulse 81 respirations 14 BP 126/79 O2 sat 100% temp 97.7  Ventilator Settings ATC 20%  . General: Comfortable at this time . Eyes: Grossly normal lids, irises & conjunctiva . ENT: grossly tongue is normal . Neck: no obvious mass . Cardiovascular: S1 S2 normal no gallop . Respiratory: No rales or rhonchi noted . Abdomen: soft . Skin: no rash seen on limited exam . Musculoskeletal: not rigid . Psychiatric:unable to assess . Neurologic: no seizure no involuntary movements         Lab Data:   Basic Metabolic Panel: Recent Labs  Lab 11/19/18 0545  NA 140  K 3.6  CL 97*  CO2 29  GLUCOSE 91  BUN 16  CREATININE 0.41*  CALCIUM 9.9    ABG: No results for input(s): PHART, PCO2ART, PO2ART, HCO3, O2SAT in the last 168 hours.  Liver Function Tests: No results for input(s): AST, ALT, ALKPHOS, BILITOT, PROT, ALBUMIN in the last 168 hours. No results for input(s): LIPASE, AMYLASE in the last 168 hours. No results for input(s): AMMONIA in the last 168 hours.  CBC: Recent Labs  Lab 11/19/18 0545  WBC 11.3*  HGB 9.1*  HCT 30.1*  MCV 83.1  PLT 354    Cardiac Enzymes: No results for input(s): CKTOTAL, CKMB, CKMBINDEX, TROPONINI in the last 168 hours.  BNP (last 3 results) No results for input(s): BNP in the last 8760 hours.  ProBNP (last 3 results) No results for input(s): PROBNP in the last 8760 hours.  Radiological Exams: No results  found.  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 Acute on chronic respiratory failure with hypoxia we will continue with T collar trials titrate oxygen continue pulmonary toilet. 2. Transverse myelitis continue with supportive care 3. Healthcare associated pneumonia treated 4. Pulmonary embolism at baseline we will continue with present management   I have personally seen and evaluated the patient, evaluated laboratory and imaging results, formulated the assessment and plan and placed orders. The Patient requires high complexity decision making for assessment and support.  Case was discussed on Rounds with the Respiratory Therapy Staff  Allyne Gee, MD [facility] Pulmonary Critical Care Medicine Sleep Medicine

## 2018-11-22 DIAGNOSIS — J9621 Acute and chronic respiratory failure with hypoxia: Secondary | ICD-10-CM | POA: Diagnosis not present

## 2018-11-22 DIAGNOSIS — I2699 Other pulmonary embolism without acute cor pulmonale: Secondary | ICD-10-CM | POA: Diagnosis not present

## 2018-11-22 DIAGNOSIS — J189 Pneumonia, unspecified organism: Secondary | ICD-10-CM | POA: Diagnosis not present

## 2018-11-22 DIAGNOSIS — G373 Acute transverse myelitis in demyelinating disease of central nervous system: Secondary | ICD-10-CM | POA: Diagnosis not present

## 2018-11-22 LAB — PROTIME-INR
INR: 2.8 — ABNORMAL HIGH (ref 0.8–1.2)
Prothrombin Time: 29.2 seconds — ABNORMAL HIGH (ref 11.4–15.2)

## 2018-11-22 NOTE — Progress Notes (Addendum)
Pulmonary Critical Care Medicine [place]   PULMONARY CRITICAL CARE SERVICE  PROGRESS NOTE  Date of Service: 11/22/2018  Xavion Muscat  GEX:[id number]  DOB: 01/08/1971   DOA: 10/21/2018  Referring Physician: Merton Border, MD  HPI: Lashaun Krapf is a 48 y.o. male seen for follow up of Acute on Chronic Respiratory Failure.  Patient continues aerosol trach collar 28% FiO2 during the day resting on the ventilator at night  Medications: Reviewed on Rounds  Physical Exam:  Vitals: Pulse 80 respiration 29 BP 121/58 O2 sat 99% temp 97.8  Ventilator Settings ATC 28%  . General: Comfortable at this time . Eyes: Grossly normal lids, irises & conjunctiva . ENT: grossly tongue is normal . Neck: no obvious mass . Cardiovascular: S1 S2 normal no gallop . Respiratory: No rales or rhonchi noted . Abdomen: soft . Skin: no rash seen on limited exam . Musculoskeletal: not rigid . Psychiatric:unable to assess . Neurologic: no seizure no involuntary movements         Lab Data:   Basic Metabolic Panel: Recent Labs  Lab 11/19/18 0545  NA 140  K 3.6  CL 97*  CO2 29  GLUCOSE 91  BUN 16  CREATININE 0.41*  CALCIUM 9.9    ABG: No results for input(s): PHART, PCO2ART, PO2ART, HCO3, O2SAT in the last 168 hours.  Liver Function Tests: No results for input(s): AST, ALT, ALKPHOS, BILITOT, PROT, ALBUMIN in the last 168 hours. No results for input(s): LIPASE, AMYLASE in the last 168 hours. No results for input(s): AMMONIA in the last 168 hours.  CBC: Recent Labs  Lab 11/19/18 0545  WBC 11.3*  HGB 9.1*  HCT 30.1*  MCV 83.1  PLT 354    Cardiac Enzymes: No results for input(s): CKTOTAL, CKMB, CKMBINDEX, TROPONINI in the last 168 hours.  BNP (last 3 results) No results for input(s): BNP in the last 8760 hours.  ProBNP (last 3 results) No results for input(s): PROBNP in the last 8760 hours.  Radiological Exams: No results found.  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 Acute on chronic respiratory failure with hypoxia we will continue with T collar trials titrate oxygen continue pulmonary toilet. 2. Transverse myelitis continue with supportive care 3. Healthcare associated pneumonia treated 4. Pulmonary embolism at baseline we will continue with present management   I have personally seen and evaluated the patient, evaluated laboratory and imaging results, formulated the assessment and plan and placed orders. The Patient requires high complexity decision making for assessment and support.  Case was discussed on Rounds with the Respiratory Therapy Staff  Allyne Gee, MD [facility] Pulmonary Critical Care Medicine Sleep Medicine

## 2018-11-23 ENCOUNTER — Other Ambulatory Visit (HOSPITAL_COMMUNITY): Payer: 59

## 2018-11-23 DIAGNOSIS — J9621 Acute and chronic respiratory failure with hypoxia: Secondary | ICD-10-CM | POA: Diagnosis not present

## 2018-11-23 DIAGNOSIS — I2699 Other pulmonary embolism without acute cor pulmonale: Secondary | ICD-10-CM | POA: Diagnosis not present

## 2018-11-23 DIAGNOSIS — J189 Pneumonia, unspecified organism: Secondary | ICD-10-CM | POA: Diagnosis not present

## 2018-11-23 DIAGNOSIS — G373 Acute transverse myelitis in demyelinating disease of central nervous system: Secondary | ICD-10-CM | POA: Diagnosis not present

## 2018-11-23 LAB — PROTIME-INR
INR: 4.6 (ref 0.8–1.2)
Prothrombin Time: 43 seconds — ABNORMAL HIGH (ref 11.4–15.2)

## 2018-11-23 NOTE — Progress Notes (Addendum)
Pulmonary Critical Care Medicine [place]   PULMONARY CRITICAL CARE SERVICE  PROGRESS NOTE  Date of Service: 11/23/2018  Benjamin Montoya  [id number]:[id number]  DOB: 1970-07-12   DOA: 10/21/2018  Referring Physician: Merton Border, MD  HPI: Benjamin Montoya is a 48 y.o. male seen for follow up of Acute on Chronic Respiratory Failure.  Patient remains 28% aerosol trach collar which is his baseline.  He rest on the vent at night.  Medications: Reviewed on Rounds  Physical Exam:  Vitals: Pulse 85 respirations 24 BP 106/57 O2 sat 99% temp 97.0  Ventilator Settings ATC 28%  . General: Comfortable at this time . Eyes: Grossly normal lids, irises & conjunctiva . ENT: grossly tongue is normal . Neck: no obvious mass . Cardiovascular: S1 S2 normal no gallop . Respiratory: No rales or rhonchi noted . Abdomen: soft . Skin: no rash seen on limited exam . Musculoskeletal: not rigid . Psychiatric:unable to assess . Neurologic: no seizure no involuntary movements         Lab Data:   Basic Metabolic Panel: Recent Labs  Lab 11/19/18 0545  NA 140  K 3.6  CL 97*  CO2 29  GLUCOSE 91  BUN 16  CREATININE 0.41*  CALCIUM 9.9    ABG: No results for input(s): PHART, PCO2ART, PO2ART, HCO3, O2SAT in the last 168 hours.  Liver Function Tests: No results for input(s): AST, ALT, ALKPHOS, BILITOT, PROT, ALBUMIN in the last 168 hours. No results for input(s): LIPASE, AMYLASE in the last 168 hours. No results for input(s): AMMONIA in the last 168 hours.  CBC: Recent Labs  Lab 11/19/18 0545  WBC 11.3*  HGB 9.1*  HCT 30.1*  MCV 83.1  PLT 354    Cardiac Enzymes: No results for input(s): CKTOTAL, CKMB, CKMBINDEX, TROPONINI in the last 168 hours.  BNP (last 3 results) No results for input(s): BNP in the last 8760 hours.  ProBNP (last 3 results) No results for input(s): PROBNP in the last 8760 hours.  Radiological Exams: Dg Shoulder Left  Result Date:  11/23/2018 CLINICAL DATA:  Left shoulder pain EXAM: LEFT SHOULDER - 2+ VIEW COMPARISON:  None. FINDINGS: No fracture or dislocation of the left shoulder. Mild acromioclavicular and glenohumeral arthrosis. Tracheostomy appliance. The partially imaged left chest is otherwise unremarkable. IMPRESSION: No fracture or dislocation of the left shoulder. Mild acromioclavicular and glenohumeral arthrosis. Electronically Signed   By: Eddie Candle M.D.   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 Acute on chronic respiratory failure with hypoxia we will continue with T collar trials titrate oxygen continue pulmonary toilet. 2. Transverse myelitis continue with supportive care 3. Healthcare associated pneumonia treated 4. Pulmonary embolism at baseline we will continue with present management.   I have personally seen and evaluated the patient, evaluated laboratory and imaging results, formulated the assessment and plan and placed orders. The Patient requires high complexity decision making for assessment and support.  Case was discussed on Rounds with the Respiratory Therapy Staff  Allyne Gee, MD [facility] Pulmonary Critical Care Medicine Sleep Medicine

## 2018-11-24 DIAGNOSIS — J9621 Acute and chronic respiratory failure with hypoxia: Secondary | ICD-10-CM | POA: Diagnosis not present

## 2018-11-24 DIAGNOSIS — G373 Acute transverse myelitis in demyelinating disease of central nervous system: Secondary | ICD-10-CM | POA: Diagnosis not present

## 2018-11-24 DIAGNOSIS — J189 Pneumonia, unspecified organism: Secondary | ICD-10-CM | POA: Diagnosis not present

## 2018-11-24 DIAGNOSIS — I2699 Other pulmonary embolism without acute cor pulmonale: Secondary | ICD-10-CM | POA: Diagnosis not present

## 2018-11-24 LAB — BASIC METABOLIC PANEL
Anion gap: 14 (ref 5–15)
BUN: 17 mg/dL (ref 6–20)
CO2: 28 mmol/L (ref 22–32)
Calcium: 9.7 mg/dL (ref 8.9–10.3)
Chloride: 99 mmol/L (ref 98–111)
Creatinine, Ser: 0.42 mg/dL — ABNORMAL LOW (ref 0.61–1.24)
GFR calc Af Amer: >60 mL/min (ref >60)
GFR calc non Af Amer: >60 mL/min (ref >60)
Glucose, Bld: 88 mg/dL (ref 70–99)
Potassium: 3.9 mmol/L (ref 3.5–5.1)
Sodium: 141 mmol/L (ref 135–145)

## 2018-11-24 LAB — CBC
HCT: 31.1 % — ABNORMAL LOW (ref 39.0–52.0)
Hemoglobin: 9 g/dL — ABNORMAL LOW (ref 13.0–17.0)
MCH: 24.7 pg — ABNORMAL LOW (ref 26.0–34.0)
MCHC: 28.9 g/dL — ABNORMAL LOW (ref 30.0–36.0)
MCV: 85.2 fL (ref 80.0–100.0)
Platelets: 307 10*3/uL (ref 150–400)
RBC: 3.65 MIL/uL — ABNORMAL LOW (ref 4.22–5.81)
RDW: 16.6 % — ABNORMAL HIGH (ref 11.5–15.5)
WBC: 11.2 10*3/uL — ABNORMAL HIGH (ref 4.0–10.5)
nRBC: 0 % (ref 0.0–0.2)

## 2018-11-24 LAB — PROTIME-INR
INR: 2.8 — ABNORMAL HIGH (ref 0.8–1.2)
Prothrombin Time: 29 seconds — ABNORMAL HIGH (ref 11.4–15.2)

## 2018-11-24 LAB — VITAMIN B12: Vitamin B-12: 592 pg/mL (ref 180–914)

## 2018-11-24 NOTE — Progress Notes (Addendum)
Pulmonary Critical Care Medicine [place]   PULMONARY CRITICAL CARE SERVICE  PROGRESS NOTE  Date of Service: 11/24/2018  Benjamin Montoya  [id number]:[id number]  DOB: [birth year]   DOA: 10/21/2018  Referring Physician: Merton Border, MD  HPI: Benjamin Montoya is a 48 y.o. male seen for follow up of Acute on Chronic Respiratory Failure.  Patient remains on aerosol trach collar 28% today at baseline and resting on the vent at night satting well with no distress.  Medications: Reviewed on Rounds  Physical Exam:  Vitals: Pulse 68 respiration 36 BP 121/80 O2 sat 99% temp 96.9  Ventilator Settings ATC 28%  . General: Comfortable at this time . Eyes: Grossly normal lids, irises & conjunctiva . ENT: grossly tongue is normal . Neck: no obvious mass . Cardiovascular: S1 S2 normal no gallop . Respiratory: No rales or rhonchi noted . Abdomen: soft . Skin: no rash seen on limited exam . Musculoskeletal: not rigid . Psychiatric:unable to assess . Neurologic: no seizure no involuntary movements         Lab Data:   Basic Metabolic Panel: Recent Labs  Lab 11/19/18 0545 11/24/18 0536  NA 140 141  K 3.6 3.9  CL 97* 99  CO2 29 28  GLUCOSE 91 88  BUN 16 17  CREATININE 0.41* 0.42*  CALCIUM 9.9 9.7    ABG: No results for input(s): PHART, PCO2ART, PO2ART, HCO3, O2SAT in the last 168 hours.  Liver Function Tests: No results for input(s): AST, ALT, ALKPHOS, BILITOT, PROT, ALBUMIN in the last 168 hours. No results for input(s): LIPASE, AMYLASE in the last 168 hours. No results for input(s): AMMONIA in the last 168 hours.  CBC: Recent Labs  Lab 11/19/18 0545 11/24/18 0536  WBC 11.3* 11.2*  HGB 9.1* 9.0*  HCT 30.1* 31.1*  MCV 83.1 85.2  PLT 354 307    Cardiac Enzymes: No results for input(s): CKTOTAL, CKMB, CKMBINDEX, TROPONINI in the last 168 hours.  BNP (last 3 results) No results for input(s): BNP in the last 8760 hours.  ProBNP (last 3 results) No results  for input(s): PROBNP in the last 8760 hours.  Radiological Exams: Dg Shoulder Left  Result Date: 11/23/2018 CLINICAL DATA:  Left shoulder pain EXAM: LEFT SHOULDER - 2+ VIEW COMPARISON:  None. FINDINGS: No fracture or dislocation of the left shoulder. Mild acromioclavicular and glenohumeral arthrosis. Tracheostomy appliance. The partially imaged left chest is otherwise unremarkable. IMPRESSION: No fracture or dislocation of the left shoulder. Mild acromioclavicular and glenohumeral arthrosis. Electronically Signed   By: Eddie Candle M.D.   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 Acute on chronic respiratory failure with hypoxia we will continue with T collar trials titrate oxygen continue pulmonary toilet. 2. Transverse myelitis continue with supportive care 3. Healthcare associated pneumonia treated 4. Pulmonary embolism at baseline we will continue with present management.   I have personally seen and evaluated the patient, evaluated laboratory and imaging results, formulated the assessment and plan and placed orders. The Patient requires high complexity decision making for assessment and support.  Case was discussed on Rounds with the Respiratory Therapy Staff  Allyne Gee, MD [facility] Pulmonary Critical Care Medicine Sleep Medicine

## 2018-11-25 DIAGNOSIS — J9621 Acute and chronic respiratory failure with hypoxia: Secondary | ICD-10-CM | POA: Diagnosis not present

## 2018-11-25 DIAGNOSIS — I2699 Other pulmonary embolism without acute cor pulmonale: Secondary | ICD-10-CM | POA: Diagnosis not present

## 2018-11-25 DIAGNOSIS — J189 Pneumonia, unspecified organism: Secondary | ICD-10-CM | POA: Diagnosis not present

## 2018-11-25 DIAGNOSIS — G373 Acute transverse myelitis in demyelinating disease of central nervous system: Secondary | ICD-10-CM | POA: Diagnosis not present

## 2018-11-25 LAB — PROTIME-INR
INR: 2.3 — ABNORMAL HIGH (ref 0.8–1.2)
Prothrombin Time: 24.6 seconds — ABNORMAL HIGH (ref 11.4–15.2)

## 2018-11-25 NOTE — Progress Notes (Addendum)
Pulmonary Critical Care Medicine [place]   PULMONARY CRITICAL CARE SERVICE  PROGRESS NOTE  Date of Service: 11/25/2018  Rojelio Uhrich  WIO:[id number]  DOB: 06-15-1970   DOA: 10/21/2018  Referring Physician: Merton Border, MD  HPI: Benjamin Montoya is a 48 y.o. male seen for follow up of Acute on Chronic Respiratory Failure.  Patient remains on aerosol trach collar 28% during the day using PMV with no distress.  Medications: Reviewed on Rounds  Physical Exam:  Vitals: Pulse 59 respirations 30 BP 165/81 O2 sat 98% temp 98.0  Ventilator Settings ATC 28%  . General: Comfortable at this time . Eyes: Grossly normal lids, irises & conjunctiva . ENT: grossly tongue is normal . Neck: no obvious mass . Cardiovascular: S1 S2 normal no gallop . Respiratory: No rales or rhonchi noted . Abdomen: soft . Skin: no rash seen on limited exam . Musculoskeletal: not rigid . Psychiatric:unable to assess . Neurologic: no seizure no involuntary movements         Lab Data:   Basic Metabolic Panel: Recent Labs  Lab 11/19/18 0545 11/24/18 0536  NA 140 141  K 3.6 3.9  CL 97* 99  CO2 29 28  GLUCOSE 91 88  BUN 16 17  CREATININE 0.41* 0.42*  CALCIUM 9.9 9.7    ABG: No results for input(s): PHART, PCO2ART, PO2ART, HCO3, O2SAT in the last 168 hours.  Liver Function Tests: No results for input(s): AST, ALT, ALKPHOS, BILITOT, PROT, ALBUMIN in the last 168 hours. No results for input(s): LIPASE, AMYLASE in the last 168 hours. No results for input(s): AMMONIA in the last 168 hours.  CBC: Recent Labs  Lab 11/19/18 0545 11/24/18 0536  WBC 11.3* 11.2*  HGB 9.1* 9.0*  HCT 30.1* 31.1*  MCV 83.1 85.2  PLT 354 307    Cardiac Enzymes: No results for input(s): CKTOTAL, CKMB, CKMBINDEX, TROPONINI in the last 168 hours.  BNP (last 3 results) No results for input(s): BNP in the last 8760 hours.  ProBNP (last 3 results) No results for input(s): PROBNP in the last 8760  hours.  Radiological Exams: No results found.  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 Acute on chronic respiratory failure with hypoxia we will continue with T collar trials titrate oxygen continue pulmonary toilet. 2. Transverse myelitis continue with supportive care 3. Healthcare associated pneumonia treated 4. Pulmonary embolism at baseline we will continue with present management.   I have personally seen and evaluated the patient, evaluated laboratory and imaging results, formulated the assessment and plan and placed orders. The Patient requires high complexity decision making for assessment and support.  Case was discussed on Rounds with the Respiratory Therapy Staff  Allyne Gee, MD [facility] Pulmonary Critical Care Medicine Sleep Medicine

## 2018-11-26 DIAGNOSIS — J9621 Acute and chronic respiratory failure with hypoxia: Secondary | ICD-10-CM | POA: Diagnosis not present

## 2018-11-26 DIAGNOSIS — J189 Pneumonia, unspecified organism: Secondary | ICD-10-CM | POA: Diagnosis not present

## 2018-11-26 DIAGNOSIS — G373 Acute transverse myelitis in demyelinating disease of central nervous system: Secondary | ICD-10-CM | POA: Diagnosis not present

## 2018-11-26 DIAGNOSIS — I2699 Other pulmonary embolism without acute cor pulmonale: Secondary | ICD-10-CM | POA: Diagnosis not present

## 2018-11-26 LAB — PROTIME-INR
INR: 1.8 — ABNORMAL HIGH (ref 0.8–1.2)
Prothrombin Time: 20.6 seconds — ABNORMAL HIGH (ref 11.4–15.2)

## 2018-11-26 NOTE — Progress Notes (Addendum)
Pulmonary Critical Care Medicine [place]   PULMONARY CRITICAL CARE SERVICE  PROGRESS NOTE  Date of Service: 11/26/2018  Benjamin Montoya  [id number]  DOB: Aug 02, 1970   DOA: 10/21/2018  Referring Physician: Merton Border, MD  HPI: Benjamin Montoya is a 48 y.o. male seen for follow up of Acute on Chronic Respiratory Failure.  Patient is on aerosol trach collar 28% FiO2 during the day resting on the vent at night.  No distress noted.  Medications: Reviewed on Rounds  Physical Exam:  Vitals: Pulse 75 respiration 26 BP 99/54 O2 sat 98% temp 97.1  Ventilator Settings ATC 28%  . General: Comfortable at this time . Eyes: Grossly normal lids, irises & conjunctiva . ENT: grossly tongue is normal . Neck: no obvious mass . Cardiovascular: S1 S2 normal no gallop . Respiratory: No rales or rhonchi noted . Abdomen: soft . Skin: no rash seen on limited exam . Musculoskeletal: not rigid . Psychiatric:unable to assess . Neurologic: no seizure no involuntary movements         Lab Data:   Basic Metabolic Panel: Recent Labs  Lab 11/24/18 0536  NA 141  K 3.9  CL 99  CO2 28  GLUCOSE 88  BUN 17  CREATININE 0.42*  CALCIUM 9.7    ABG: No results for input(s): PHART, PCO2ART, PO2ART, HCO3, O2SAT in the last 168 hours.  Liver Function Tests: No results for input(s): AST, ALT, ALKPHOS, BILITOT, PROT, ALBUMIN in the last 168 hours. No results for input(s): LIPASE, AMYLASE in the last 168 hours. No results for input(s): AMMONIA in the last 168 hours.  CBC: Recent Labs  Lab 11/24/18 0536  WBC 11.2*  HGB 9.0*  HCT 31.1*  MCV 85.2  PLT 307    Cardiac Enzymes: No results for input(s): CKTOTAL, CKMB, CKMBINDEX, TROPONINI in the last 168 hours.  BNP (last 3 results) No results for input(s): BNP in the last 8760 hours.  ProBNP (last 3 results) No results for input(s): PROBNP in the last 8760 hours.  Radiological Exams: No results  found.  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 Acute on chronic respiratory failure with hypoxia we will continue with T collar trials titrate oxygen continue pulmonary toilet. 2. Transverse myelitis continue with supportive care 3. Healthcare associated pneumonia treated 4. Pulmonary embolism at baseline we will continue with present management.   I have personally seen and evaluated the patient, evaluated laboratory and imaging results, formulated the assessment and plan and placed orders. The Patient requires high complexity decision making for assessment and support.  Case was discussed on Rounds with the Respiratory Therapy Staff  Allyne Gee, MD [facility] Pulmonary Critical Care Medicine Sleep Medicine

## 2018-11-27 DIAGNOSIS — J9621 Acute and chronic respiratory failure with hypoxia: Secondary | ICD-10-CM | POA: Diagnosis not present

## 2018-11-27 DIAGNOSIS — I2699 Other pulmonary embolism without acute cor pulmonale: Secondary | ICD-10-CM | POA: Diagnosis not present

## 2018-11-27 DIAGNOSIS — J189 Pneumonia, unspecified organism: Secondary | ICD-10-CM | POA: Diagnosis not present

## 2018-11-27 DIAGNOSIS — G373 Acute transverse myelitis in demyelinating disease of central nervous system: Secondary | ICD-10-CM | POA: Diagnosis not present

## 2018-11-27 LAB — PROTIME-INR
INR: 1.8 — ABNORMAL HIGH (ref 0.8–1.2)
Prothrombin Time: 20.4 seconds — ABNORMAL HIGH (ref 11.4–15.2)

## 2018-11-27 NOTE — Progress Notes (Addendum)
Pulmonary Critical Care Medicine [place]   PULMONARY CRITICAL CARE SERVICE  PROGRESS NOTE  Date of Service: 11/27/2018  Benjamin Montoya  [id number]  DOB: 1970/10/03   DOA: 10/21/2018  Referring Physician: Merton Border, MD  HPI: Benjamin Montoya is a 48 y.o. male seen for follow up of Acute on Chronic Respiratory Failure.  Patient continues on 28% aerosol trach collar during the day resting on the vent at night satting well at this time with no distress.  Medications: Reviewed on Rounds  Physical Exam:  Vitals: Pulse 81 respirations 14 BP 106/68 O2 sat 99% temp 98.3  Ventilator Settings ATC 28%  . General: Comfortable at this time . Eyes: Grossly normal lids, irises & conjunctiva . ENT: grossly tongue is normal . Neck: no obvious mass . Cardiovascular: S1 S2 normal no gallop . Respiratory: No rales or rhonchi noted . Abdomen: soft . Skin: no rash seen on limited exam . Musculoskeletal: not rigid . Psychiatric:unable to assess . Neurologic: no seizure no involuntary movements         Lab Data:   Basic Metabolic Panel: Recent Labs  Lab 11/24/18 0536  NA 141  K 3.9  CL 99  CO2 28  GLUCOSE 88  BUN 17  CREATININE 0.42*  CALCIUM 9.7    ABG: No results for input(s): PHART, PCO2ART, PO2ART, HCO3, O2SAT in the last 168 hours.  Liver Function Tests: No results for input(s): AST, ALT, ALKPHOS, BILITOT, PROT, ALBUMIN in the last 168 hours. No results for input(s): LIPASE, AMYLASE in the last 168 hours. No results for input(s): AMMONIA in the last 168 hours.  CBC: Recent Labs  Lab 11/24/18 0536  WBC 11.2*  HGB 9.0*  HCT 31.1*  MCV 85.2  PLT 307    Cardiac Enzymes: No results for input(s): CKTOTAL, CKMB, CKMBINDEX, TROPONINI in the last 168 hours.  BNP (last 3 results) No results for input(s): BNP in the last 8760 hours.  ProBNP (last 3 results) No results for input(s): PROBNP in the last 8760 hours.  Radiological Exams: No results  found.  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 Acute on chronic respiratory failure with hypoxia we will continue with T collar trials titrate oxygen continue pulmonary toilet. 2. Transverse myelitis continue with supportive care 3. Healthcare associated pneumonia treated 4. Pulmonary embolism at baseline we will continue with present management.   I have personally seen and evaluated the patient, evaluated laboratory and imaging results, formulated the assessment and plan and placed orders. The Patient requires high complexity decision making for assessment and support.  Case was discussed on Rounds with the Respiratory Therapy Staff  Allyne Gee, MD [facility] Pulmonary Critical Care Medicine Sleep Medicine

## 2018-11-28 DIAGNOSIS — G373 Acute transverse myelitis in demyelinating disease of central nervous system: Secondary | ICD-10-CM | POA: Diagnosis not present

## 2018-11-28 DIAGNOSIS — J9621 Acute and chronic respiratory failure with hypoxia: Secondary | ICD-10-CM | POA: Diagnosis not present

## 2018-11-28 DIAGNOSIS — I2699 Other pulmonary embolism without acute cor pulmonale: Secondary | ICD-10-CM | POA: Diagnosis not present

## 2018-11-28 DIAGNOSIS — J189 Pneumonia, unspecified organism: Secondary | ICD-10-CM | POA: Diagnosis not present

## 2018-11-28 LAB — PROTIME-INR
INR: 1.8 — ABNORMAL HIGH (ref 0.8–1.2)
Prothrombin Time: 21 seconds — ABNORMAL HIGH (ref 11.4–15.2)

## 2018-11-28 NOTE — Progress Notes (Addendum)
Pulmonary Critical Care Medicine [place]   PULMONARY CRITICAL CARE SERVICE  PROGRESS NOTE  Date of Service: 11/28/2018  Nidal Rivet  [id number]  DOB: [birth year]   DOA: 10/21/2018  Referring Physician: Merton Border, MD  HPI: Egan Berkheimer is a 48 y.o. male seen for follow up of Acute on Chronic Respiratory Failure.  Patient mains at baseline aerosol trach collar 28% FiO2 satting well with no fever or distress.  Resting on the ventilator at night.  Medications: Reviewed on Rounds  Physical Exam:  Vitals: Pulse 80 respirations 22 BP 118/74 O2 sat 98% temp 97.8  Ventilator Settings ATC 28%  . General: Comfortable at this time . Eyes: Grossly normal lids, irises & conjunctiva . ENT: grossly tongue is normal . Neck: no obvious mass . Cardiovascular: S1 S2 normal no gallop . Respiratory: No rales or rhonchi noted . Abdomen: soft . Skin: no rash seen on limited exam . Musculoskeletal: not rigid . Psychiatric:unable to assess . Neurologic: no seizure no involuntary movements         Lab Data:   Basic Metabolic Panel: Recent Labs  Lab 11/24/18 0536  NA 141  K 3.9  CL 99  CO2 28  GLUCOSE 88  BUN 17  CREATININE 0.42*  CALCIUM 9.7    ABG: No results for input(s): PHART, PCO2ART, PO2ART, HCO3, O2SAT in the last 168 hours.  Liver Function Tests: No results for input(s): AST, ALT, ALKPHOS, BILITOT, PROT, ALBUMIN in the last 168 hours. No results for input(s): LIPASE, AMYLASE in the last 168 hours. No results for input(s): AMMONIA in the last 168 hours.  CBC: Recent Labs  Lab 11/24/18 0536  WBC 11.2*  HGB 9.0*  HCT 31.1*  MCV 85.2  PLT 307    Cardiac Enzymes: No results for input(s): CKTOTAL, CKMB, CKMBINDEX, TROPONINI in the last 168 hours.  BNP (last 3 results) No results for input(s): BNP in the last 8760 hours.  ProBNP (last 3 results) No results for input(s): PROBNP in the last 8760 hours.  Radiological Exams: No results  found.  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 Acute on chronic respiratory failure with hypoxia we will continue with T collar trials titrate oxygen continue pulmonary toilet. 2. Transverse myelitis continue with supportive care 3. Healthcare associated pneumonia treated 4. Pulmonary embolism at baseline we will continue with present management.   I have personally seen and evaluated the patient, evaluated laboratory and imaging results, formulated the assessment and plan and placed orders. The Patient requires high complexity decision making for assessment and support.  Case was discussed on Rounds with the Respiratory Therapy Staff  Allyne Gee, MD [facility] Pulmonary Critical Care Medicine Sleep Medicine

## 2018-11-29 DIAGNOSIS — J189 Pneumonia, unspecified organism: Secondary | ICD-10-CM | POA: Diagnosis not present

## 2018-11-29 DIAGNOSIS — I2699 Other pulmonary embolism without acute cor pulmonale: Secondary | ICD-10-CM | POA: Diagnosis not present

## 2018-11-29 DIAGNOSIS — G373 Acute transverse myelitis in demyelinating disease of central nervous system: Secondary | ICD-10-CM | POA: Diagnosis not present

## 2018-11-29 DIAGNOSIS — J9621 Acute and chronic respiratory failure with hypoxia: Secondary | ICD-10-CM | POA: Diagnosis not present

## 2018-11-29 LAB — CBC
HCT: 30.9 % — ABNORMAL LOW (ref 39.0–52.0)
Hemoglobin: 9.2 g/dL — ABNORMAL LOW (ref 13.0–17.0)
MCH: 24.6 pg — ABNORMAL LOW (ref 26.0–34.0)
MCHC: 29.8 g/dL — ABNORMAL LOW (ref 30.0–36.0)
MCV: 82.6 fL (ref 80.0–100.0)
Platelets: 390 10*3/uL (ref 150–400)
RBC: 3.74 MIL/uL — ABNORMAL LOW (ref 4.22–5.81)
RDW: 16.1 % — ABNORMAL HIGH (ref 11.5–15.5)
WBC: 10.6 10*3/uL — ABNORMAL HIGH (ref 4.0–10.5)
nRBC: 0 % (ref 0.0–0.2)

## 2018-11-29 LAB — BASIC METABOLIC PANEL
Anion gap: 11 (ref 5–15)
BUN: 23 mg/dL — ABNORMAL HIGH (ref 6–20)
CO2: 29 mmol/L (ref 22–32)
Calcium: 9.6 mg/dL (ref 8.9–10.3)
Chloride: 97 mmol/L — ABNORMAL LOW (ref 98–111)
Creatinine, Ser: 0.5 mg/dL — ABNORMAL LOW (ref 0.61–1.24)
GFR calc Af Amer: >60 mL/min (ref >60)
GFR calc non Af Amer: >60 mL/min (ref >60)
Glucose, Bld: 88 mg/dL (ref 70–99)
Potassium: 4 mmol/L (ref 3.5–5.1)
Sodium: 137 mmol/L (ref 135–145)

## 2018-11-29 LAB — PROTIME-INR
INR: 1.7 — ABNORMAL HIGH (ref 0.8–1.2)
Prothrombin Time: 19.3 seconds — ABNORMAL HIGH (ref 11.4–15.2)

## 2018-11-29 NOTE — Progress Notes (Signed)
Pulmonary Critical Care Medicine [place]   PULMONARY CRITICAL CARE SERVICE  PROGRESS NOTE  Date of Service: 11/29/2018  Benjamin Montoya  [id number]  DOB: [birth year]   DOA: 10/21/2018  Referring Physician: Merton Border, MD  HPI: Benjamin Montoya is a 48 y.o. male seen for follow up of Acute on Chronic Respiratory Failure.  Patient is off the ventilator this morning on T collar currently on 28% FiO2  Medications: Reviewed on Rounds  Physical Exam:  Vitals: Temperature 96.6 pulse 72 respiratory rate 20 blood pressure 118/75 saturations 99%  Ventilator Settings off the ventilator on T collar FiO2 28%  . General: Comfortable at this time . Eyes: Grossly normal lids, irises & conjunctiva . ENT: grossly tongue is normal . Neck: no obvious mass . Cardiovascular: S1 S2 normal no gallop . Respiratory: Scattered rhonchi expansion equal . Abdomen: soft . Skin: no rash seen on limited exam . Musculoskeletal: not rigid . Psychiatric:unable to assess . Neurologic: no seizure no involuntary movements         Lab Data:   Basic Metabolic Panel: Recent Labs  Lab 11/24/18 0536 11/29/18 0348  NA 141 137  K 3.9 4.0  CL 99 97*  CO2 28 29  GLUCOSE 88 88  BUN 17 23*  CREATININE 0.42* 0.50*  CALCIUM 9.7 9.6    ABG: No results for input(s): PHART, PCO2ART, PO2ART, HCO3, O2SAT in the last 168 hours.  Liver Function Tests: No results for input(s): AST, ALT, ALKPHOS, BILITOT, PROT, ALBUMIN in the last 168 hours. No results for input(s): LIPASE, AMYLASE in the last 168 hours. No results for input(s): AMMONIA in the last 168 hours.  CBC: Recent Labs  Lab 11/24/18 0536 11/29/18 0348  WBC 11.2* 10.6*  HGB 9.0* 9.2*  HCT 31.1* 30.9*  MCV 85.2 82.6  PLT 307 390    Cardiac Enzymes: No results for input(s): CKTOTAL, CKMB, CKMBINDEX, TROPONINI in the last 168 hours.  BNP (last 3 results) No results for input(s): BNP in the last 8760 hours.  ProBNP (last 3  results) No results for input(s): PROBNP in the last 8760 hours.  Radiological Exams: No results found.  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 Acute on chronic respiratory failure with hypoxia patient is off the ventilator during daytime resting on the ventilator at nighttime continue with T collar trials 2. Acute transverse myelitis grossly unchanged we will continue with present management 3. Healthcare associated pneumonia treated 4. Pulmonary embolism treated   I have personally seen and evaluated the patient, evaluated laboratory and imaging results, formulated the assessment and plan and placed orders. The Patient requires high complexity decision making for assessment and support.  Case was discussed on Rounds with the Respiratory Therapy Staff  Allyne Gee, MD [facility] Pulmonary Critical Care Medicine Sleep Medicine

## 2018-11-30 DIAGNOSIS — J189 Pneumonia, unspecified organism: Secondary | ICD-10-CM | POA: Diagnosis not present

## 2018-11-30 DIAGNOSIS — I2699 Other pulmonary embolism without acute cor pulmonale: Secondary | ICD-10-CM | POA: Diagnosis not present

## 2018-11-30 DIAGNOSIS — J9621 Acute and chronic respiratory failure with hypoxia: Secondary | ICD-10-CM | POA: Diagnosis not present

## 2018-11-30 DIAGNOSIS — G373 Acute transverse myelitis in demyelinating disease of central nervous system: Secondary | ICD-10-CM | POA: Diagnosis not present

## 2018-11-30 LAB — PROTIME-INR
INR: 1.9 — ABNORMAL HIGH (ref 0.8–1.2)
Prothrombin Time: 21.9 seconds — ABNORMAL HIGH (ref 11.4–15.2)

## 2018-11-30 NOTE — Progress Notes (Signed)
Pulmonary Critical Care Medicine [place]   PULMONARY CRITICAL CARE SERVICE  PROGRESS NOTE  Date of Service: 11/30/2018  Benjamin Montoya  [id number]  DOB: 14-Oct-1970   DOA: 10/21/2018  Referring Physician: Merton Border, MD  HPI: Benjamin Montoya is a 48 y.o. male seen for follow up of Acute on Chronic Respiratory Failure.  Patient is doing relatively well on T collar right now is on 28% FiO2  Medications: Reviewed on Rounds  Physical Exam:  Vitals: Temperature 96.2 pulse 68 respiratory rate 20 blood pressure 110/58 saturations 99%  Ventilator Settings off the ventilator right now on T collar FiO2 28%  . General: Comfortable at this time . Eyes: Grossly normal lids, irises & conjunctiva . ENT: grossly tongue is normal . Neck: no obvious mass . Cardiovascular: S1 S2 normal no gallop . Respiratory: Coarse breath sounds with few rhonchi . Abdomen: soft . Skin: no rash seen on limited exam . Musculoskeletal: not rigid . Psychiatric:unable to assess . Neurologic: no seizure no involuntary movements         Lab Data:   Basic Metabolic Panel: Recent Labs  Lab 11/24/18 0536 11/29/18 0348  NA 141 137  K 3.9 4.0  CL 99 97*  CO2 28 29  GLUCOSE 88 88  BUN 17 23*  CREATININE 0.42* 0.50*  CALCIUM 9.7 9.6    ABG: No results for input(s): PHART, PCO2ART, PO2ART, HCO3, O2SAT in the last 168 hours.  Liver Function Tests: No results for input(s): AST, ALT, ALKPHOS, BILITOT, PROT, ALBUMIN in the last 168 hours. No results for input(s): LIPASE, AMYLASE in the last 168 hours. No results for input(s): AMMONIA in the last 168 hours.  CBC: Recent Labs  Lab 11/24/18 0536 11/29/18 0348  WBC 11.2* 10.6*  HGB 9.0* 9.2*  HCT 31.1* 30.9*  MCV 85.2 82.6  PLT 307 390    Cardiac Enzymes: No results for input(s): CKTOTAL, CKMB, CKMBINDEX, TROPONINI in the last 168 hours.  BNP (last 3 results) No results for input(s): BNP in the last 8760 hours.  ProBNP  (last 3 results) No results for input(s): PROBNP in the last 8760 hours.  Radiological Exams: No results found.  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 Acute on chronic respiratory failure with hypoxia patient is doing well with the T collar wean on 28% FiO2 will continue secretion management supportive care 2. Acute transverse myelitis at baseline 3. Healthcare associated pneumonia treated resolved 4. Pulmonary embolism treated   I have personally seen and evaluated the patient, evaluated laboratory and imaging results, formulated the assessment and plan and placed orders. The Patient requires high complexity decision making for assessment and support.  Case was discussed on Rounds with the Respiratory Therapy Staff  Allyne Gee, MD [facility] Pulmonary Critical Care Medicine Sleep Medicine

## 2018-12-01 DIAGNOSIS — I2699 Other pulmonary embolism without acute cor pulmonale: Secondary | ICD-10-CM | POA: Diagnosis not present

## 2018-12-01 DIAGNOSIS — G373 Acute transverse myelitis in demyelinating disease of central nervous system: Secondary | ICD-10-CM | POA: Diagnosis not present

## 2018-12-01 DIAGNOSIS — J189 Pneumonia, unspecified organism: Secondary | ICD-10-CM | POA: Diagnosis not present

## 2018-12-01 DIAGNOSIS — J9621 Acute and chronic respiratory failure with hypoxia: Secondary | ICD-10-CM | POA: Diagnosis not present

## 2018-12-01 LAB — PROTIME-INR
INR: 2.2 — ABNORMAL HIGH (ref 0.8–1.2)
Prothrombin Time: 24.3 seconds — ABNORMAL HIGH (ref 11.4–15.2)

## 2018-12-01 NOTE — Progress Notes (Signed)
Pulmonary Critical Care Medicine [place]   PULMONARY CRITICAL CARE SERVICE  PROGRESS NOTE  Date of Service: 12/01/2018  Benjamin Montoya  [id number]  DOB: 1970-04-25   DOA: 10/21/2018  Referring Physician: Merton Border, MD  HPI: Benjamin Montoya is a 48 y.o. male seen for follow up of Acute on Chronic Respiratory Failure.  Patient currently is on T collar is on 28% FiO2 good saturations are noted.  Medications: Reviewed on Rounds  Physical Exam:  Vitals: Temperature 97.1 pulse 68 respiratory rate 36 blood pressure 111/68 saturations 99%  Ventilator Settings off the ventilator right now on T collar  . General: Comfortable at this time . Eyes: Grossly normal lids, irises & conjunctiva . ENT: grossly tongue is normal . Neck: no obvious mass . Cardiovascular: S1 S2 normal no gallop . Respiratory: No rhonchi coarse breath sounds . Abdomen: soft . Skin: no rash seen on limited exam . Musculoskeletal: not rigid . Psychiatric:unable to assess . Neurologic: no seizure no involuntary movements         Lab Data:   Basic Metabolic Panel: Recent Labs  Lab 11/29/18 0348  NA 137  K 4.0  CL 97*  CO2 29  GLUCOSE 88  BUN 23*  CREATININE 0.50*  CALCIUM 9.6    ABG: No results for input(s): PHART, PCO2ART, PO2ART, HCO3, O2SAT in the last 168 hours.  Liver Function Tests: No results for input(s): AST, ALT, ALKPHOS, BILITOT, PROT, ALBUMIN in the last 168 hours. No results for input(s): LIPASE, AMYLASE in the last 168 hours. No results for input(s): AMMONIA in the last 168 hours.  CBC: Recent Labs  Lab 11/29/18 0348  WBC 10.6*  HGB 9.2*  HCT 30.9*  MCV 82.6  PLT 390    Cardiac Enzymes: No results for input(s): CKTOTAL, CKMB, CKMBINDEX, TROPONINI in the last 168 hours.  BNP (last 3 results) No results for input(s): BNP in the last 8760 hours.  ProBNP (last 3 results) No results for input(s): PROBNP in the last 8760 hours.  Radiological  Exams: No results found.  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 Acute on chronic respiratory failure with hypoxia we will continue with T collar trials titrate oxygen continue pulmonary toilet.  Patient is resting on the ventilator at nighttime 2. Acute transverse myelitis advanced disease stable at this time 3. Healthcare associated pneumonia treated we will continue with supportive care 4. Pulmonary embolism treated we will monitor   I have personally seen and evaluated the patient, evaluated laboratory and imaging results, formulated the assessment and plan and placed orders. The Patient requires high complexity decision making for assessment and support.  Case was discussed on Rounds with the Respiratory Therapy Staff  Allyne Gee, MD [facility] Pulmonary Critical Care Medicine Sleep Medicine

## 2018-12-02 DIAGNOSIS — G373 Acute transverse myelitis in demyelinating disease of central nervous system: Secondary | ICD-10-CM | POA: Diagnosis not present

## 2018-12-02 DIAGNOSIS — I2699 Other pulmonary embolism without acute cor pulmonale: Secondary | ICD-10-CM | POA: Diagnosis not present

## 2018-12-02 DIAGNOSIS — J9621 Acute and chronic respiratory failure with hypoxia: Secondary | ICD-10-CM | POA: Diagnosis not present

## 2018-12-02 DIAGNOSIS — J189 Pneumonia, unspecified organism: Secondary | ICD-10-CM | POA: Diagnosis not present

## 2018-12-02 LAB — PROTIME-INR
INR: 2.2 — ABNORMAL HIGH (ref 0.8–1.2)
Prothrombin Time: 23.9 seconds — ABNORMAL HIGH (ref 11.4–15.2)

## 2018-12-02 NOTE — Progress Notes (Signed)
Pulmonary Critical Care Medicine [place]   PULMONARY CRITICAL CARE SERVICE  PROGRESS NOTE  Date of Service: 12/02/2018  Benjamin Montoya  [id number]  DOB: 08/01/1970   DOA: 10/21/2018  Referring Physician: Merton Border, MD  HPI: Benjamin Montoya is a 48 y.o. male seen for follow up of Acute on Chronic Respiratory Failure.  Patient currently is on T collar has been on 28% FiO2 good saturations are noted  Medications: Reviewed on Rounds  Physical Exam:  Vitals: Temperature 96.5 pulse 76 respiratory 18 blood pressure 137/70 saturations 98%  Ventilator Settings off the ventilator right now on T collar FiO2 28%  . General: Comfortable at this time . Eyes: Grossly normal lids, irises & conjunctiva . ENT: grossly tongue is normal . Neck: no obvious mass . Cardiovascular: S1 S2 normal no gallop . Respiratory: No rhonchi coarse breath sounds . Abdomen: soft . Skin: no rash seen on limited exam . Musculoskeletal: not rigid . Psychiatric:unable to assess . Neurologic: no seizure no involuntary movements         Lab Data:   Basic Metabolic Panel: Recent Labs  Lab 11/29/18 0348  NA 137  K 4.0  CL 97*  CO2 29  GLUCOSE 88  BUN 23*  CREATININE 0.50*  CALCIUM 9.6    ABG: No results for input(s): PHART, PCO2ART, PO2ART, HCO3, O2SAT in the last 168 hours.  Liver Function Tests: No results for input(s): AST, ALT, ALKPHOS, BILITOT, PROT, ALBUMIN in the last 168 hours. No results for input(s): LIPASE, AMYLASE in the last 168 hours. No results for input(s): AMMONIA in the last 168 hours.  CBC: Recent Labs  Lab 11/29/18 0348  WBC 10.6*  HGB 9.2*  HCT 30.9*  MCV 82.6  PLT 390    Cardiac Enzymes: No results for input(s): CKTOTAL, CKMB, CKMBINDEX, TROPONINI in the last 168 hours.  BNP (last 3 results) No results for input(s): BNP in the last 8760 hours.  ProBNP (last 3 results) No results for input(s): PROBNP in the last 8760  hours.  Radiological Exams: No results found.  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 Acute on chronic respiratory failure with hypoxia we will continue with T collar trials continue secretion management pulmonary toilet. 2. Acute transverse myelitis unchanged we will continue with supportive care 3. Healthcare associated pneumonia treated clinically improving 4. Pulmonary embolism at baseline   I have personally seen and evaluated the patient, evaluated laboratory and imaging results, formulated the assessment and plan and placed orders. The Patient requires high complexity decision making for assessment and support.  Case was discussed on Rounds with the Respiratory Therapy Staff  Allyne Gee, MD [facility] Pulmonary Critical Care Medicine Sleep Medicine

## 2018-12-03 DIAGNOSIS — I2699 Other pulmonary embolism without acute cor pulmonale: Secondary | ICD-10-CM | POA: Diagnosis not present

## 2018-12-03 DIAGNOSIS — J9621 Acute and chronic respiratory failure with hypoxia: Secondary | ICD-10-CM | POA: Diagnosis not present

## 2018-12-03 DIAGNOSIS — G373 Acute transverse myelitis in demyelinating disease of central nervous system: Secondary | ICD-10-CM | POA: Diagnosis not present

## 2018-12-03 DIAGNOSIS — J189 Pneumonia, unspecified organism: Secondary | ICD-10-CM | POA: Diagnosis not present

## 2018-12-03 LAB — PROTIME-INR
INR: 2.4 — ABNORMAL HIGH (ref 0.8–1.2)
Prothrombin Time: 26.1 seconds — ABNORMAL HIGH (ref 11.4–15.2)

## 2018-12-03 NOTE — Progress Notes (Addendum)
Pulmonary Critical Care Medicine [place]   PULMONARY CRITICAL CARE SERVICE  PROGRESS NOTE  Date of Service: 12/03/2018  Benjamin Montoya  [id number]  DOB: September 30, 1970   DOA: 10/21/2018  Referring Physician: Merton Border, MD  HPI: Benjamin Montoya is a 48 y.o. male seen for follow up of Acute on Chronic Respiratory Failure.  Patient remains at baseline on 28% aerosol trach collar using PMV during the day resting the ventilator at night.  Medications: Reviewed on Rounds  Physical Exam:  Vitals: Pulse 77 respirations 20 BP 132/80 O2 sat 99% temp 97.3  Ventilator Settings 28% ATC  . General: Comfortable at this time . Eyes: Grossly normal lids, irises & conjunctiva . ENT: grossly tongue is normal . Neck: no obvious mass . Cardiovascular: S1 S2 normal no gallop . Respiratory: No rales or rhonchi noted . Abdomen: soft . Skin: no rash seen on limited exam . Musculoskeletal: not rigid . Psychiatric:unable to assess . Neurologic: no seizure no involuntary movements         Lab Data:   Basic Metabolic Panel: Recent Labs  Lab 11/29/18 0348  NA 137  K 4.0  CL 97*  CO2 29  GLUCOSE 88  BUN 23*  CREATININE 0.50*  CALCIUM 9.6    ABG: No results for input(s): PHART, PCO2ART, PO2ART, HCO3, O2SAT in the last 168 hours.  Liver Function Tests: No results for input(s): AST, ALT, ALKPHOS, BILITOT, PROT, ALBUMIN in the last 168 hours. No results for input(s): LIPASE, AMYLASE in the last 168 hours. No results for input(s): AMMONIA in the last 168 hours.  CBC: Recent Labs  Lab 11/29/18 0348  WBC 10.6*  HGB 9.2*  HCT 30.9*  MCV 82.6  PLT 390    Cardiac Enzymes: No results for input(s): CKTOTAL, CKMB, CKMBINDEX, TROPONINI in the last 168 hours.  BNP (last 3 results) No results for input(s): BNP in the last 8760 hours.  ProBNP (last 3 results) No results for input(s): PROBNP in the last 8760 hours.  Radiological Exams: No results  found.  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 Acute on chronic respiratory failure with hypoxia we will continue with T collar trials continue secretion management pulmonary toilet. 2. Acute transverse myelitis unchanged we will continue with supportive care 3. Healthcare associated pneumonia treated clinically improving 4. Pulmonary embolism at baseline   I have personally seen and evaluated the patient, evaluated laboratory and imaging results, formulated the assessment and plan and placed orders. The Patient requires high complexity decision making for assessment and support.  Case was discussed on Rounds with the Respiratory Therapy Staff  Allyne Gee, MD [facility] Pulmonary Critical Care Medicine Sleep Medicine

## 2018-12-04 DIAGNOSIS — J189 Pneumonia, unspecified organism: Secondary | ICD-10-CM | POA: Diagnosis not present

## 2018-12-04 DIAGNOSIS — I2699 Other pulmonary embolism without acute cor pulmonale: Secondary | ICD-10-CM | POA: Diagnosis not present

## 2018-12-04 DIAGNOSIS — J9621 Acute and chronic respiratory failure with hypoxia: Secondary | ICD-10-CM | POA: Diagnosis not present

## 2018-12-04 DIAGNOSIS — G373 Acute transverse myelitis in demyelinating disease of central nervous system: Secondary | ICD-10-CM | POA: Diagnosis not present

## 2018-12-04 LAB — PROTIME-INR
INR: 2.4 — ABNORMAL HIGH (ref 0.8–1.2)
Prothrombin Time: 25.8 seconds — ABNORMAL HIGH (ref 11.4–15.2)

## 2018-12-04 NOTE — Progress Notes (Addendum)
Pulmonary Critical Care Medicine [place]   PULMONARY CRITICAL CARE SERVICE  PROGRESS NOTE  Date of Service: 12/04/2018  Benjamin Montoya  [id number]  DOB: [birth year]   DOA: 10/21/2018  Referring Physician: Merton Border, MD  HPI: Benjamin Montoya is a 48 y.o. male seen for follow up of Acute on Chronic Respiratory Failure.  Patient continues on 20% aerosol trach collar using PMV and resting on the vent at night.  Satting well no distress.  Medications: Reviewed on Rounds  Physical Exam:  Vitals: Pulse 71 respirations 26 BP 92/56 O2 sat 99% temp 98.4  Ventilator Settings 28% ATC  . General: Comfortable at this time . Eyes: Grossly normal lids, irises & conjunctiva . ENT: grossly tongue is normal . Neck: no obvious mass . Cardiovascular: S1 S2 normal no gallop . Respiratory: No rales or rhonchi noted . Abdomen: soft . Skin: no rash seen on limited exam . Musculoskeletal: not rigid . Psychiatric:unable to assess . Neurologic: no seizure no involuntary movements         Lab Data:   Basic Metabolic Panel: Recent Labs  Lab 11/29/18 0348  NA 137  K 4.0  CL 97*  CO2 29  GLUCOSE 88  BUN 23*  CREATININE 0.50*  CALCIUM 9.6    ABG: No results for input(s): PHART, PCO2ART, PO2ART, HCO3, O2SAT in the last 168 hours.  Liver Function Tests: No results for input(s): AST, ALT, ALKPHOS, BILITOT, PROT, ALBUMIN in the last 168 hours. No results for input(s): LIPASE, AMYLASE in the last 168 hours. No results for input(s): AMMONIA in the last 168 hours.  CBC: Recent Labs  Lab 11/29/18 0348  WBC 10.6*  HGB 9.2*  HCT 30.9*  MCV 82.6  PLT 390    Cardiac Enzymes: No results for input(s): CKTOTAL, CKMB, CKMBINDEX, TROPONINI in the last 168 hours.  BNP (last 3 results) No results for input(s): BNP in the last 8760 hours.  ProBNP (last 3 results) No results for input(s): PROBNP in the last 8760 hours.  Radiological Exams: No results  found.  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 Acute on chronic respiratory failure with hypoxia we will continue with T collar trials continue secretion management pulmonary toilet. 2. Acute transverse myelitis unchanged we will continue with supportive care 3. Healthcare associated pneumonia treated clinically improving 4. Pulmonary embolism at baseline    I have personally seen and evaluated the patient, evaluated laboratory and imaging results, formulated the assessment and plan and placed orders. The Patient requires high complexity decision making for assessment and support.  Case was discussed on Rounds with the Respiratory Therapy Staff  Allyne Gee, MD [facility] Pulmonary Critical Care Medicine Sleep Medicine

## 2018-12-05 DIAGNOSIS — J189 Pneumonia, unspecified organism: Secondary | ICD-10-CM | POA: Diagnosis not present

## 2018-12-05 DIAGNOSIS — I2699 Other pulmonary embolism without acute cor pulmonale: Secondary | ICD-10-CM | POA: Diagnosis not present

## 2018-12-05 DIAGNOSIS — J9621 Acute and chronic respiratory failure with hypoxia: Secondary | ICD-10-CM | POA: Diagnosis not present

## 2018-12-05 DIAGNOSIS — G373 Acute transverse myelitis in demyelinating disease of central nervous system: Secondary | ICD-10-CM | POA: Diagnosis not present

## 2018-12-05 LAB — PROTIME-INR
INR: 2.7 — ABNORMAL HIGH (ref 0.8–1.2)
Prothrombin Time: 28.3 seconds — ABNORMAL HIGH (ref 11.4–15.2)

## 2018-12-05 NOTE — Progress Notes (Addendum)
Pulmonary Critical Care Medicine [place]   PULMONARY CRITICAL CARE SERVICE  PROGRESS NOTE  Date of Service: 12/05/2018  Benjamin Montoya  [id number]:[id number]  DOB: [birth year]   DOA: 10/21/2018  Referring Physician: Merton Border, MD  HPI: Benjamin Montoya is a 48 y.o. male seen for follow up of Acute on Chronic Respiratory Failure.  Patient continues on aerosol trach on 28% using PMV and resting on the ventilator at night.  Medications: Reviewed on Rounds  Physical Exam:  Vitals: Pulse 77 respirations 21 BP 155/78 O2 sat 8% temp 97.3  Ventilator Settings 28% ATC  . General: Comfortable at this time . Eyes: Grossly normal lids, irises & conjunctiva . ENT: grossly tongue is normal . Neck: no obvious mass . Cardiovascular: S1 S2 normal no gallop . Respiratory: No rales or rhonchi noted . Abdomen: soft . Skin: no rash seen on limited exam . Musculoskeletal: not rigid . Psychiatric:unable to assess . Neurologic: no seizure no involuntary movements         Lab Data:   Basic Metabolic Panel: Recent Labs  Lab 11/29/18 0348  NA 137  K 4.0  CL 97*  CO2 29  GLUCOSE 88  BUN 23*  CREATININE 0.50*  CALCIUM 9.6    ABG: No results for input(s): PHART, PCO2ART, PO2ART, HCO3, O2SAT in the last 168 hours.  Liver Function Tests: No results for input(s): AST, ALT, ALKPHOS, BILITOT, PROT, ALBUMIN in the last 168 hours. No results for input(s): LIPASE, AMYLASE in the last 168 hours. No results for input(s): AMMONIA in the last 168 hours.  CBC: Recent Labs  Lab 11/29/18 0348  WBC 10.6*  HGB 9.2*  HCT 30.9*  MCV 82.6  PLT 390    Cardiac Enzymes: No results for input(s): CKTOTAL, CKMB, CKMBINDEX, TROPONINI in the last 168 hours.  BNP (last 3 results) No results for input(s): BNP in the last 8760 hours.  ProBNP (last 3 results) No results for input(s): PROBNP in the last 8760 hours.  Radiological Exams: No results found.  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 Acute on chronic respiratory failure with hypoxia we will continue with T collar trials continue secretion management pulmonary toilet. 2. Acute transverse myelitis unchanged we will continue with supportive care 3. Healthcare associated pneumonia treated clinically improving 4. Pulmonary embolism at baseline   I have personally seen and evaluated the patient, evaluated laboratory and imaging results, formulated the assessment and plan and placed orders. The Patient requires high complexity decision making for assessment and support.  Case was discussed on Rounds with the Respiratory Therapy Staff  Allyne Gee, MD [facility] Pulmonary Critical Care Medicine Sleep Medicine

## 2018-12-06 DIAGNOSIS — J9621 Acute and chronic respiratory failure with hypoxia: Secondary | ICD-10-CM | POA: Diagnosis not present

## 2018-12-06 DIAGNOSIS — J189 Pneumonia, unspecified organism: Secondary | ICD-10-CM | POA: Diagnosis not present

## 2018-12-06 DIAGNOSIS — G373 Acute transverse myelitis in demyelinating disease of central nervous system: Secondary | ICD-10-CM | POA: Diagnosis not present

## 2018-12-06 DIAGNOSIS — I2699 Other pulmonary embolism without acute cor pulmonale: Secondary | ICD-10-CM | POA: Diagnosis not present

## 2018-12-06 LAB — PROTIME-INR
INR: 2.8 — ABNORMAL HIGH (ref 0.8–1.2)
Prothrombin Time: 29.2 seconds — ABNORMAL HIGH (ref 11.4–15.2)

## 2018-12-06 NOTE — Progress Notes (Addendum)
Pulmonary Critical Care Medicine [place]   PULMONARY CRITICAL CARE SERVICE  PROGRESS NOTE  Date of Service: 12/06/2018  Benjamin Montoya  UVO:[id number]  DOB: Apr 05, 1970   DOA: 10/21/2018  Referring Physician: Merton Border, MD  HPI: Benjamin Montoya is a 48 y.o. male seen for follow up of Acute on Chronic Respiratory Failure.  Patient remains on aerosol trach collar 28% using PMV  Medications: Reviewed on Rounds  Physical Exam:  Vitals: Pulse 63 respiration 38 BP 114/69 O2 sat 88% temp 97.0  Ventilator Settings ATC 20%  . General: Comfortable at this time . Eyes: Grossly normal lids, irises & conjunctiva . ENT: grossly tongue is normal . Neck: no obvious mass . Cardiovascular: S1 S2 normal no gallop . Respiratory: No rales or rhonchi noted . Abdomen: soft . Skin: no rash seen on limited exam . Musculoskeletal: not rigid . Psychiatric:unable to assess . Neurologic: no seizure no involuntary movements         Lab Data:   Basic Metabolic Panel: No results for input(s): NA, K, CL, CO2, GLUCOSE, BUN, CREATININE, CALCIUM, MG, PHOS in the last 168 hours.  ABG: No results for input(s): PHART, PCO2ART, PO2ART, HCO3, O2SAT in the last 168 hours.  Liver Function Tests: No results for input(s): AST, ALT, ALKPHOS, BILITOT, PROT, ALBUMIN in the last 168 hours. No results for input(s): LIPASE, AMYLASE in the last 168 hours. No results for input(s): AMMONIA in the last 168 hours.  CBC: No results for input(s): WBC, NEUTROABS, HGB, HCT, MCV, PLT in the last 168 hours.  Cardiac Enzymes: No results for input(s): CKTOTAL, CKMB, CKMBINDEX, TROPONINI in the last 168 hours.  BNP (last 3 results) No results for input(s): BNP in the last 8760 hours.  ProBNP (last 3 results) No results for input(s): PROBNP in the last 8760 hours.  Radiological Exams: No results found.  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 Acute on chronic respiratory failure with hypoxia we will continue with T collar trials continue secretion management pulmonary toilet. 2. Acute transverse myelitis unchanged we will continue with supportive care 3. Healthcare associated pneumonia treated clinically improving 4. Pulmonary embolism at baseline   I have personally seen and evaluated the patient, evaluated laboratory and imaging results, formulated the assessment and plan and placed orders. The Patient requires high complexity decision making for assessment and support.  Case was discussed on Rounds with the Respiratory Therapy Staff  Allyne Gee, MD [facility] Pulmonary Critical Care Medicine Sleep Medicine

## 2018-12-07 DIAGNOSIS — J9621 Acute and chronic respiratory failure with hypoxia: Secondary | ICD-10-CM | POA: Diagnosis not present

## 2018-12-07 DIAGNOSIS — I2699 Other pulmonary embolism without acute cor pulmonale: Secondary | ICD-10-CM | POA: Diagnosis not present

## 2018-12-07 DIAGNOSIS — G373 Acute transverse myelitis in demyelinating disease of central nervous system: Secondary | ICD-10-CM | POA: Diagnosis not present

## 2018-12-07 DIAGNOSIS — J189 Pneumonia, unspecified organism: Secondary | ICD-10-CM | POA: Diagnosis not present

## 2018-12-07 LAB — PROTIME-INR
INR: 2.9 — ABNORMAL HIGH (ref 0.8–1.2)
Prothrombin Time: 29.6 seconds — ABNORMAL HIGH (ref 11.4–15.2)

## 2018-12-07 NOTE — Progress Notes (Addendum)
Pulmonary Critical Care Medicine [place]   PULMONARY CRITICAL CARE SERVICE  PROGRESS NOTE  Date of Service: 12/07/2018  Benjamin Montoya  [id number]  DOB: 1970-11-09   DOA: 10/21/2018  Referring Physician: Merton Border, MD  HPI: Benjamin Montoya is a 48 y.o. male seen for follow up of Acute on Chronic Respiratory Failure.  Patient continues on 28% aerosol trach collar as tolerated has done 24 hours as of today using PMV with no difficulty.  Medications: Reviewed on Rounds  Physical Exam:  Vitals: Pulse 61 respirations 18 BP 102/51 O2 sat 98% 96.7  Ventilator Settings ATC 28%  . General: Comfortable at this time . Eyes: Grossly normal lids, irises & conjunctiva . ENT: grossly tongue is normal . Neck: no obvious mass . Cardiovascular: S1 S2 normal no gallop . Respiratory: No rales or rhonchi did . Abdomen: soft . Skin: no rash seen on limited exam . Musculoskeletal: not rigid . Psychiatric:unable to assess . Neurologic: no seizure no involuntary movements         Lab Data:   Basic Metabolic Panel: No results for input(s): NA, K, CL, CO2, GLUCOSE, BUN, CREATININE, CALCIUM, MG, PHOS in the last 168 hours.  ABG: No results for input(s): PHART, PCO2ART, PO2ART, HCO3, O2SAT in the last 168 hours.  Liver Function Tests: No results for input(s): AST, ALT, ALKPHOS, BILITOT, PROT, ALBUMIN in the last 168 hours. No results for input(s): LIPASE, AMYLASE in the last 168 hours. No results for input(s): AMMONIA in the last 168 hours.  CBC: No results for input(s): WBC, NEUTROABS, HGB, HCT, MCV, PLT in the last 168 hours.  Cardiac Enzymes: No results for input(s): CKTOTAL, CKMB, CKMBINDEX, TROPONINI in the last 168 hours.  BNP (last 3 results) No results for input(s): BNP in the last 8760 hours.  ProBNP (last 3 results) No results for input(s): PROBNP in the last 8760 hours.  Radiological Exams: No results found.  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 Acute on chronic respiratory failure with hypoxia we will continue with T collar trials continue secretion management pulmonary toilet. 2. Acute transverse myelitis unchanged we will continue with supportive care 3. Healthcare associated pneumonia treated clinically improving 4. Pulmonary embolism at baseline   I have personally seen and evaluated the patient, evaluated laboratory and imaging results, formulated the assessment and plan and placed orders. The Patient requires high complexity decision making for assessment and support.  Case was discussed on Rounds with the Respiratory Therapy Staff  Allyne Gee, MD [facility] Pulmonary Critical Care Medicine Sleep Medicine

## 2018-12-08 DIAGNOSIS — I2699 Other pulmonary embolism without acute cor pulmonale: Secondary | ICD-10-CM | POA: Diagnosis not present

## 2018-12-08 DIAGNOSIS — J9621 Acute and chronic respiratory failure with hypoxia: Secondary | ICD-10-CM | POA: Diagnosis not present

## 2018-12-08 DIAGNOSIS — J189 Pneumonia, unspecified organism: Secondary | ICD-10-CM | POA: Diagnosis not present

## 2018-12-08 DIAGNOSIS — G373 Acute transverse myelitis in demyelinating disease of central nervous system: Secondary | ICD-10-CM | POA: Diagnosis not present

## 2018-12-08 LAB — PROTIME-INR
INR: 2.5 — ABNORMAL HIGH (ref 0.8–1.2)
Prothrombin Time: 26.5 seconds — ABNORMAL HIGH (ref 11.4–15.2)

## 2018-12-08 LAB — SARS CORONAVIRUS 2 BY RT PCR (HOSPITAL ORDER, PERFORMED IN ~~LOC~~ HOSPITAL LAB): SARS Coronavirus 2: NEGATIVE

## 2018-12-08 NOTE — Progress Notes (Signed)
Pulmonary Critical Care Medicine [place]   PULMONARY CRITICAL CARE SERVICE  PROGRESS NOTE  Date of Service: 12/08/2018  Benjamin Montoya  [id number]  DOB: Dec 05, 1970   DOA: 10/21/2018  Referring Physician: Merton Border, MD  HPI: Benjamin Montoya is a 48 y.o. male seen for follow up of Acute on Chronic Respiratory Failure.  Patient currently is on T collar overnight the patient himself asked to go back on the ventilator.  Right now is on 28% FiO2  Medications: Reviewed on Rounds  Physical Exam:  Vitals: Temperature 96.8 pulse 64 respiratory rate 22 blood pressure 126/61 saturations 97%  Ventilator Settings on T collar currently on 28% FiO2  . General: Comfortable at this time . Eyes: Grossly normal lids, irises & conjunctiva . ENT: grossly tongue is normal . Neck: no obvious mass . Cardiovascular: S1 S2 normal no gallop . Respiratory: Scattered rhonchi expansion is equal . Abdomen: soft . Skin: no rash seen on limited exam . Musculoskeletal: not rigid . Psychiatric:unable to assess . Neurologic: no seizure no involuntary movements         Lab Data:   Basic Metabolic Panel: No results for input(s): NA, K, CL, CO2, GLUCOSE, BUN, CREATININE, CALCIUM, MG, PHOS in the last 168 hours.  ABG: No results for input(s): PHART, PCO2ART, PO2ART, HCO3, O2SAT in the last 168 hours.  Liver Function Tests: No results for input(s): AST, ALT, ALKPHOS, BILITOT, PROT, ALBUMIN in the last 168 hours. No results for input(s): LIPASE, AMYLASE in the last 168 hours. No results for input(s): AMMONIA in the last 168 hours.  CBC: No results for input(s): WBC, NEUTROABS, HGB, HCT, MCV, PLT in the last 168 hours.  Cardiac Enzymes: No results for input(s): CKTOTAL, CKMB, CKMBINDEX, TROPONINI in the last 168 hours.  BNP (last 3 results) No results for input(s): BNP in the last 8760 hours.  ProBNP (last 3 results) No results for input(s): PROBNP in the last 8760  hours.  Radiological Exams: No results found.  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 Acute on chronic respiratory failure with hypoxia patient is back on the ventilator overnight now is back on T collar.  Patient stated that he cannot remain off the ventilator for prolonged period of time. 2. Acute transverse myelitis will continue with supportive care 3. Healthcare associated pneumonia treated 4. Pulmonary embolism treated we will continue with supportive care   I have personally seen and evaluated the patient, evaluated laboratory and imaging results, formulated the assessment and plan and placed orders. The Patient requires high complexity decision making for assessment and support.  Case was discussed on Rounds with the Respiratory Therapy Staff  Allyne Gee, MD [facility] Pulmonary Critical Care Medicine Sleep Medicine

## 2018-12-09 LAB — PROTIME-INR
INR: 2 — ABNORMAL HIGH (ref 0.8–1.2)
Prothrombin Time: 22.8 seconds — ABNORMAL HIGH (ref 11.4–15.2)

## 2019-02-04 DIAGNOSIS — J9621 Acute and chronic respiratory failure with hypoxia: Secondary | ICD-10-CM

## 2019-02-04 DIAGNOSIS — I480 Paroxysmal atrial fibrillation: Secondary | ICD-10-CM

## 2019-02-04 DIAGNOSIS — I2690 Septic pulmonary embolism without acute cor pulmonale: Secondary | ICD-10-CM

## 2019-02-04 DIAGNOSIS — G373 Acute transverse myelitis in demyelinating disease of central nervous system: Secondary | ICD-10-CM

## 2019-02-05 DIAGNOSIS — I2690 Septic pulmonary embolism without acute cor pulmonale: Secondary | ICD-10-CM

## 2019-02-05 DIAGNOSIS — G373 Acute transverse myelitis in demyelinating disease of central nervous system: Secondary | ICD-10-CM

## 2019-02-05 DIAGNOSIS — I480 Paroxysmal atrial fibrillation: Secondary | ICD-10-CM

## 2019-02-05 DIAGNOSIS — J9621 Acute and chronic respiratory failure with hypoxia: Secondary | ICD-10-CM

## 2019-02-06 DIAGNOSIS — I2690 Septic pulmonary embolism without acute cor pulmonale: Secondary | ICD-10-CM

## 2019-02-06 DIAGNOSIS — G373 Acute transverse myelitis in demyelinating disease of central nervous system: Secondary | ICD-10-CM

## 2019-02-06 DIAGNOSIS — J9621 Acute and chronic respiratory failure with hypoxia: Secondary | ICD-10-CM

## 2019-02-06 DIAGNOSIS — I480 Paroxysmal atrial fibrillation: Secondary | ICD-10-CM

## 2019-02-14 DIAGNOSIS — I480 Paroxysmal atrial fibrillation: Secondary | ICD-10-CM

## 2019-02-14 DIAGNOSIS — I2690 Septic pulmonary embolism without acute cor pulmonale: Secondary | ICD-10-CM

## 2019-02-14 DIAGNOSIS — G373 Acute transverse myelitis in demyelinating disease of central nervous system: Secondary | ICD-10-CM

## 2019-02-14 DIAGNOSIS — J9621 Acute and chronic respiratory failure with hypoxia: Secondary | ICD-10-CM

## 2019-02-15 DIAGNOSIS — I2690 Septic pulmonary embolism without acute cor pulmonale: Secondary | ICD-10-CM

## 2019-02-15 DIAGNOSIS — I480 Paroxysmal atrial fibrillation: Secondary | ICD-10-CM

## 2019-02-15 DIAGNOSIS — G373 Acute transverse myelitis in demyelinating disease of central nervous system: Secondary | ICD-10-CM

## 2019-02-15 DIAGNOSIS — J9621 Acute and chronic respiratory failure with hypoxia: Secondary | ICD-10-CM

## 2019-02-16 DIAGNOSIS — I480 Paroxysmal atrial fibrillation: Secondary | ICD-10-CM

## 2019-02-16 DIAGNOSIS — I2690 Septic pulmonary embolism without acute cor pulmonale: Secondary | ICD-10-CM

## 2019-02-16 DIAGNOSIS — J9621 Acute and chronic respiratory failure with hypoxia: Secondary | ICD-10-CM

## 2019-02-16 DIAGNOSIS — G373 Acute transverse myelitis in demyelinating disease of central nervous system: Secondary | ICD-10-CM

## 2019-02-17 DIAGNOSIS — I480 Paroxysmal atrial fibrillation: Secondary | ICD-10-CM

## 2019-02-17 DIAGNOSIS — G373 Acute transverse myelitis in demyelinating disease of central nervous system: Secondary | ICD-10-CM

## 2019-02-17 DIAGNOSIS — J9621 Acute and chronic respiratory failure with hypoxia: Secondary | ICD-10-CM

## 2019-02-17 DIAGNOSIS — I2690 Septic pulmonary embolism without acute cor pulmonale: Secondary | ICD-10-CM

## 2019-02-18 DIAGNOSIS — I480 Paroxysmal atrial fibrillation: Secondary | ICD-10-CM

## 2019-02-18 DIAGNOSIS — J9621 Acute and chronic respiratory failure with hypoxia: Secondary | ICD-10-CM

## 2019-02-18 DIAGNOSIS — I2690 Septic pulmonary embolism without acute cor pulmonale: Secondary | ICD-10-CM

## 2019-02-18 DIAGNOSIS — G373 Acute transverse myelitis in demyelinating disease of central nervous system: Secondary | ICD-10-CM

## 2019-02-19 DIAGNOSIS — I2690 Septic pulmonary embolism without acute cor pulmonale: Secondary | ICD-10-CM

## 2019-02-19 DIAGNOSIS — J9621 Acute and chronic respiratory failure with hypoxia: Secondary | ICD-10-CM

## 2019-02-19 DIAGNOSIS — I480 Paroxysmal atrial fibrillation: Secondary | ICD-10-CM

## 2019-02-19 DIAGNOSIS — G373 Acute transverse myelitis in demyelinating disease of central nervous system: Secondary | ICD-10-CM

## 2019-02-20 DIAGNOSIS — I480 Paroxysmal atrial fibrillation: Secondary | ICD-10-CM

## 2019-02-20 DIAGNOSIS — G373 Acute transverse myelitis in demyelinating disease of central nervous system: Secondary | ICD-10-CM

## 2019-02-20 DIAGNOSIS — J9621 Acute and chronic respiratory failure with hypoxia: Secondary | ICD-10-CM

## 2019-02-20 DIAGNOSIS — I2690 Septic pulmonary embolism without acute cor pulmonale: Secondary | ICD-10-CM

## 2019-02-28 DIAGNOSIS — I2690 Septic pulmonary embolism without acute cor pulmonale: Secondary | ICD-10-CM

## 2019-02-28 DIAGNOSIS — J9621 Acute and chronic respiratory failure with hypoxia: Secondary | ICD-10-CM

## 2019-02-28 DIAGNOSIS — I480 Paroxysmal atrial fibrillation: Secondary | ICD-10-CM

## 2019-02-28 DIAGNOSIS — G373 Acute transverse myelitis in demyelinating disease of central nervous system: Secondary | ICD-10-CM

## 2019-03-01 DIAGNOSIS — I2690 Septic pulmonary embolism without acute cor pulmonale: Secondary | ICD-10-CM

## 2019-03-01 DIAGNOSIS — I480 Paroxysmal atrial fibrillation: Secondary | ICD-10-CM

## 2019-03-01 DIAGNOSIS — J9621 Acute and chronic respiratory failure with hypoxia: Secondary | ICD-10-CM

## 2019-03-01 DIAGNOSIS — G373 Acute transverse myelitis in demyelinating disease of central nervous system: Secondary | ICD-10-CM

## 2019-03-02 DIAGNOSIS — J9621 Acute and chronic respiratory failure with hypoxia: Secondary | ICD-10-CM

## 2019-03-02 DIAGNOSIS — G373 Acute transverse myelitis in demyelinating disease of central nervous system: Secondary | ICD-10-CM

## 2019-03-02 DIAGNOSIS — I2690 Septic pulmonary embolism without acute cor pulmonale: Secondary | ICD-10-CM

## 2019-03-02 DIAGNOSIS — I480 Paroxysmal atrial fibrillation: Secondary | ICD-10-CM

## 2019-03-03 DIAGNOSIS — I2690 Septic pulmonary embolism without acute cor pulmonale: Secondary | ICD-10-CM

## 2019-03-03 DIAGNOSIS — J9621 Acute and chronic respiratory failure with hypoxia: Secondary | ICD-10-CM

## 2019-03-03 DIAGNOSIS — G373 Acute transverse myelitis in demyelinating disease of central nervous system: Secondary | ICD-10-CM

## 2019-03-03 DIAGNOSIS — I480 Paroxysmal atrial fibrillation: Secondary | ICD-10-CM

## 2019-03-04 DIAGNOSIS — G373 Acute transverse myelitis in demyelinating disease of central nervous system: Secondary | ICD-10-CM

## 2019-03-04 DIAGNOSIS — I480 Paroxysmal atrial fibrillation: Secondary | ICD-10-CM

## 2019-03-04 DIAGNOSIS — J9621 Acute and chronic respiratory failure with hypoxia: Secondary | ICD-10-CM

## 2019-03-04 DIAGNOSIS — I2690 Septic pulmonary embolism without acute cor pulmonale: Secondary | ICD-10-CM

## 2020-10-28 IMAGING — DX PORTABLE ABDOMEN - 1 VIEW
1 series · 1 of 1 positions shown · non-contrast
Comparison: None.

CLINICAL DATA: Encounter for nasogastric tube placement.

EXAM:
PORTABLE ABDOMEN - 1 VIEW

[abdomen kub]
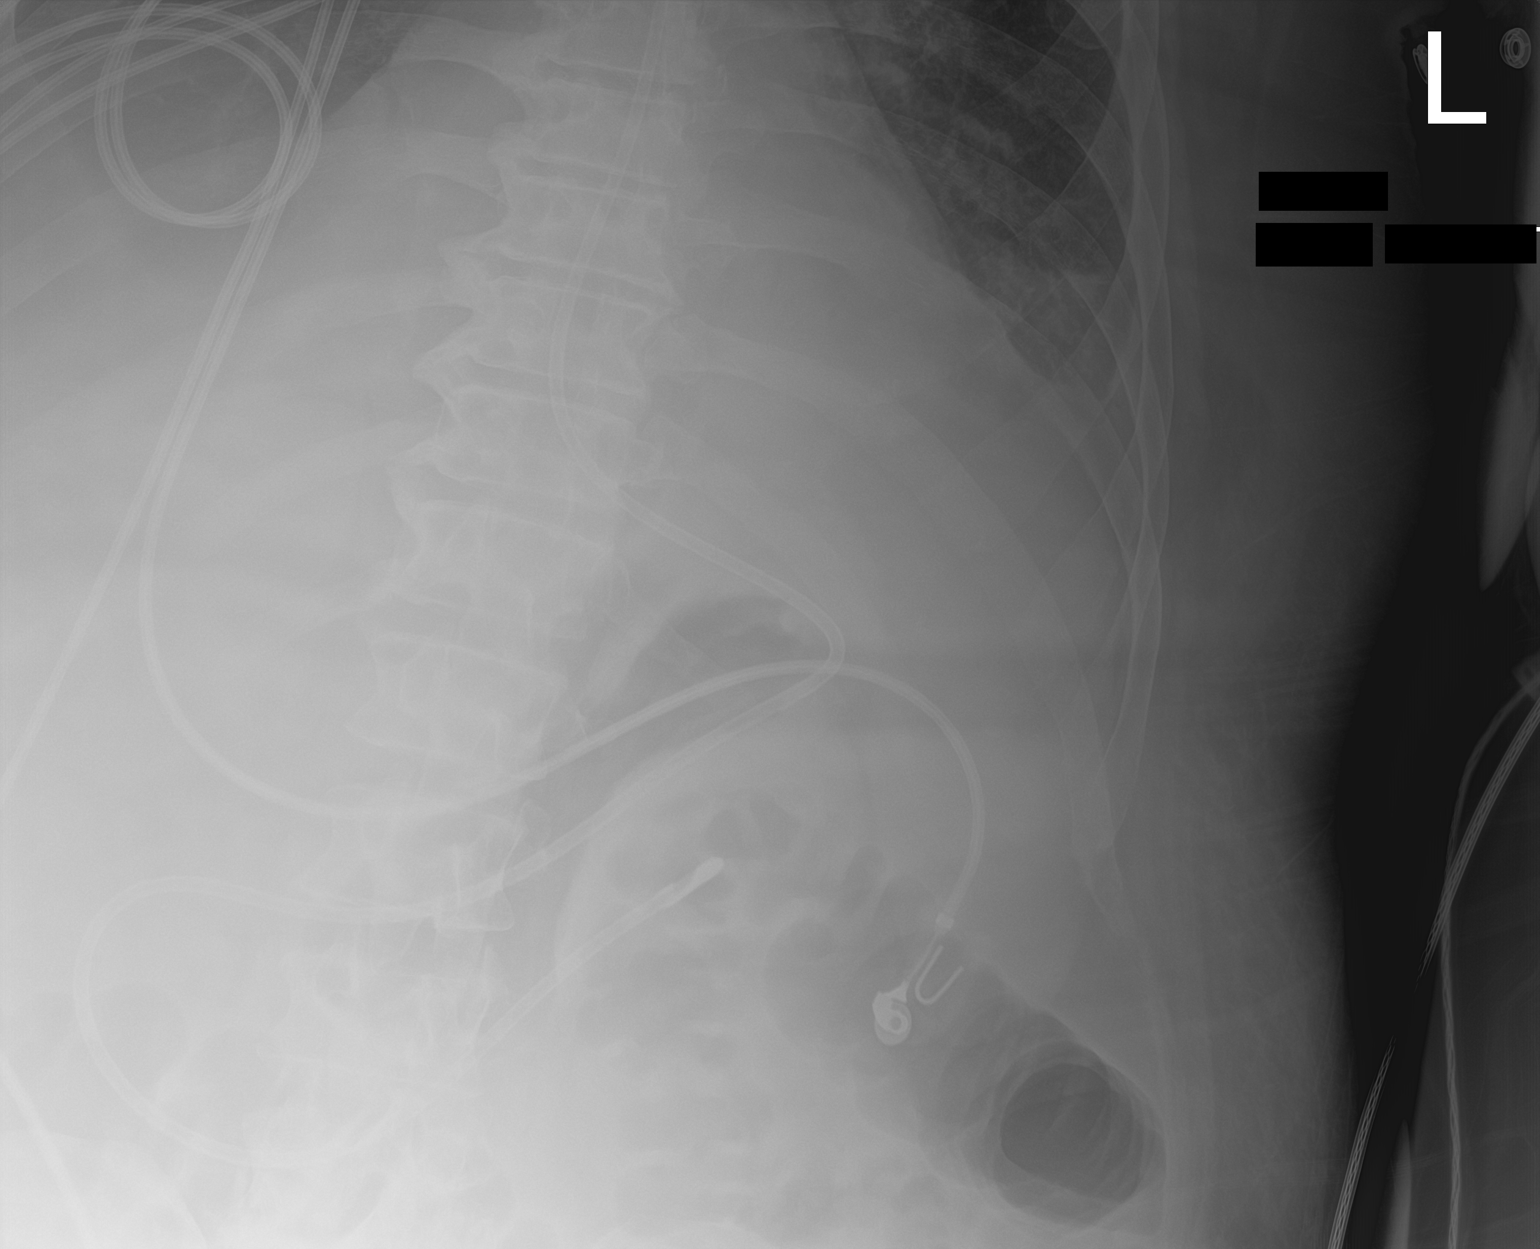

[1 of 1 positions shown; findings below may reference images not displayed]

FINDINGS: Nasogastric tube or feeding tube is been inserted, and has passed
through the stomach, with the tip near the ligament of Treitz.
IMPRESSION: Satisfactory nasogastric tube placement.

## 2020-11-08 IMAGING — CT CT ABDOMEN WITHOUT CONTRAST
2 of 4 series · 15 of 46 positions shown, 17 images · non-contrast
Comparison: None.

CLINICAL DATA: Evaluate anatomy prior to potential percutaneous
gastrostomy tube placement.

EXAM:
CT ABDOMEN WITHOUT CONTRAST
TECHNIQUE: Multidetector CT imaging of the abdomen was performed following the
standard protocol without IV contrast.

[Series 3: ap without · axial · non-contrast · 0.96mm/px · z∈[+1230,+1560]mm · 12 of 76 slices shown, 14 images]
[im 5/76  soft-tissue]
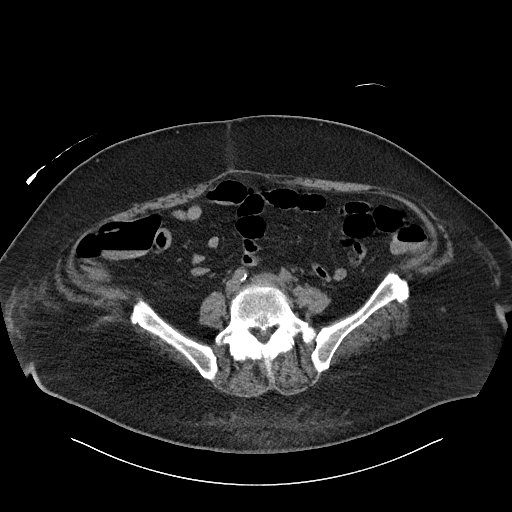
[im 5/76  bone]
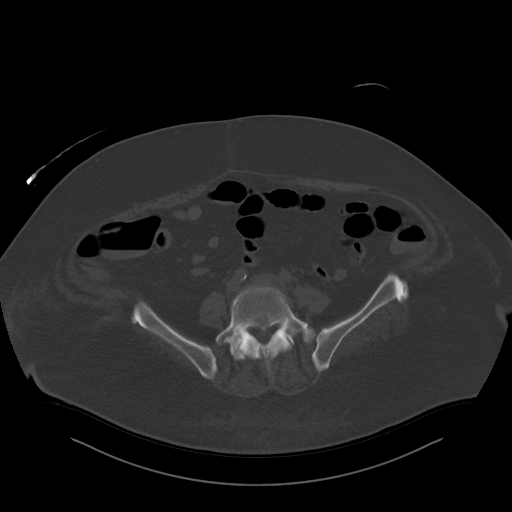
[im 10/76  soft-tissue]
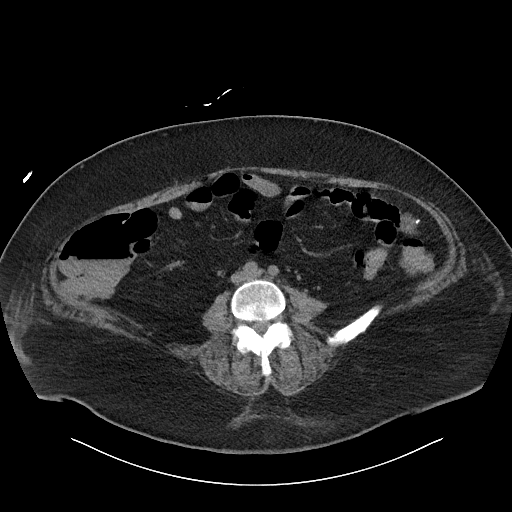
[im 19/76  soft-tissue]
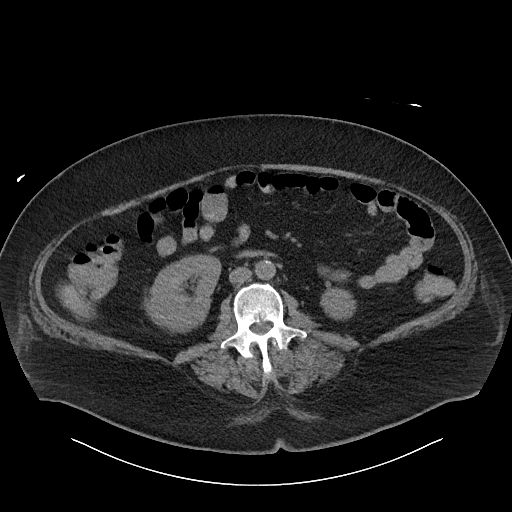
[im 24/76  soft-tissue]
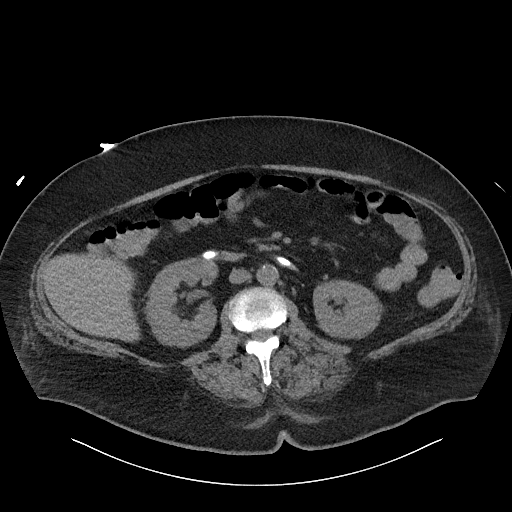
[im 29/76  soft-tissue]
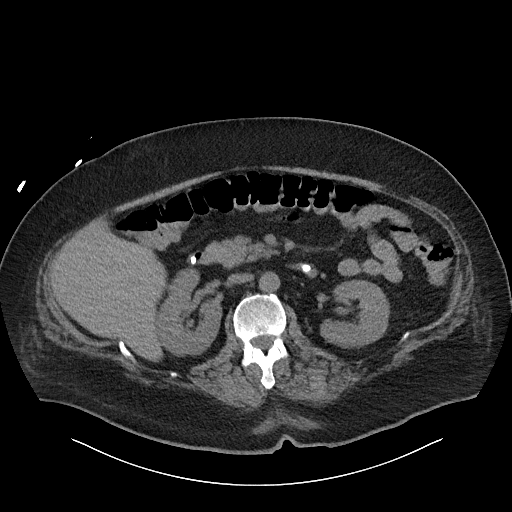
[im 33/76  soft-tissue]
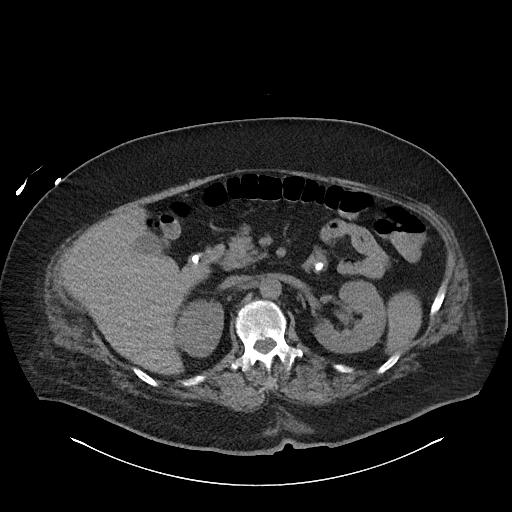
[im 43/76  soft-tissue]
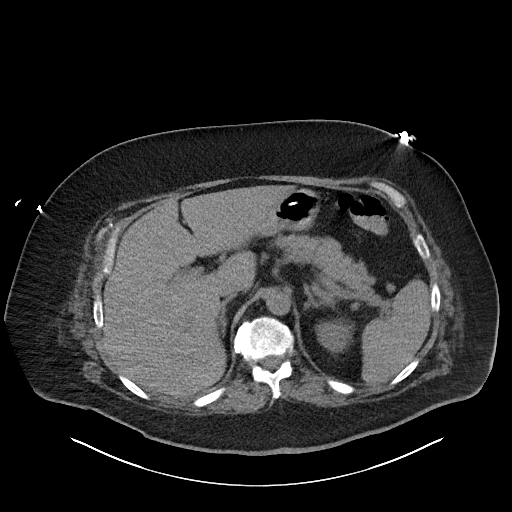
[im 47/76  soft-tissue]
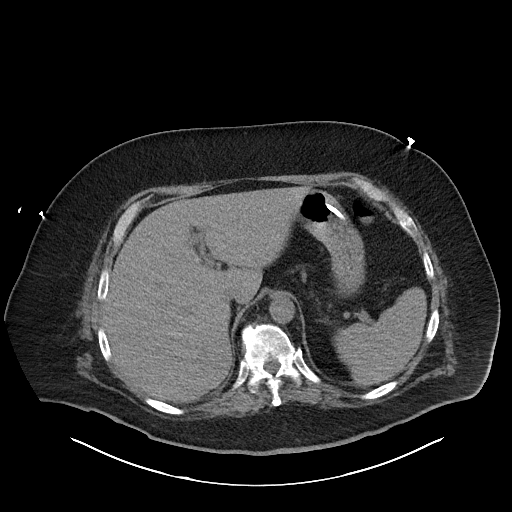
[im 52/76  soft-tissue]
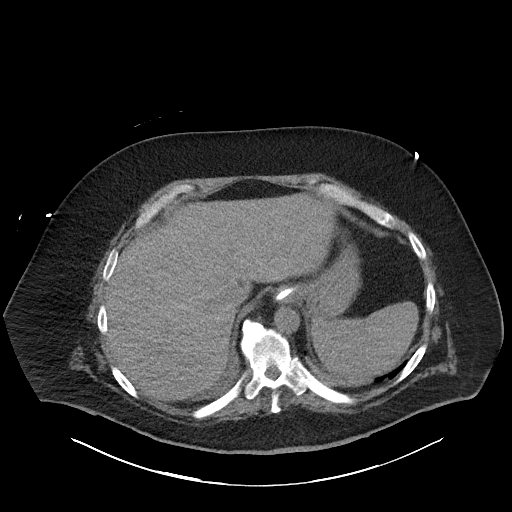
[im 52/76  bone]
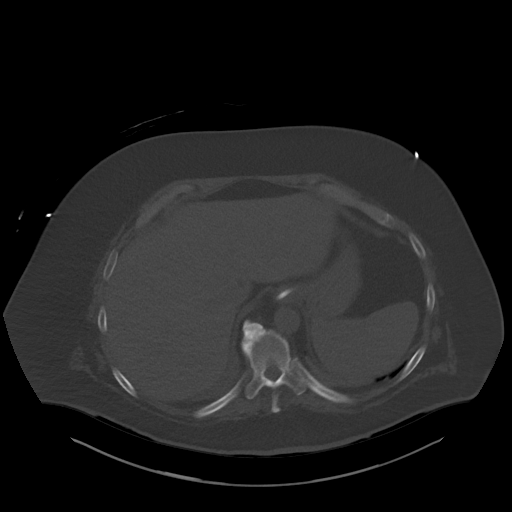
[im 57/76  soft-tissue]
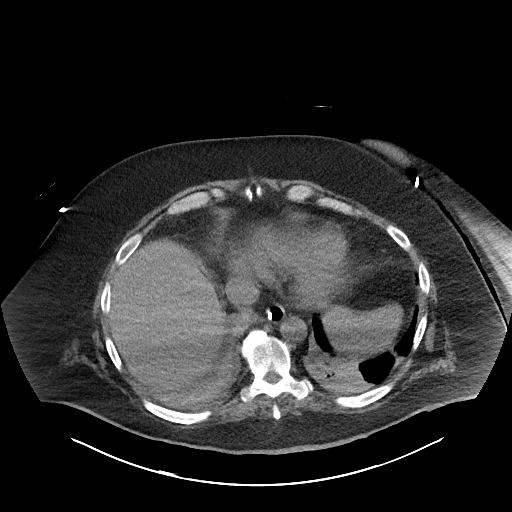
[im 66/76  soft-tissue]
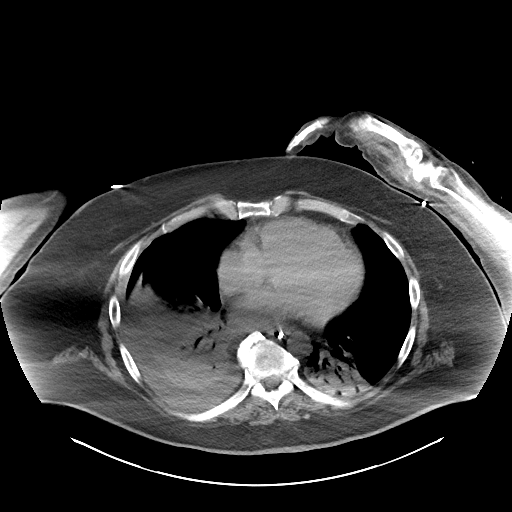
[im 71/76  soft-tissue]
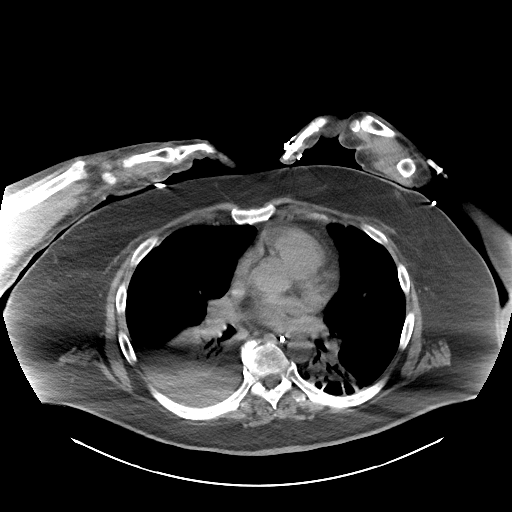

[Series 6: cor · coronal · 0.74mm/px · 3 of 110 slices shown]
[im 37/110  soft-tissue]
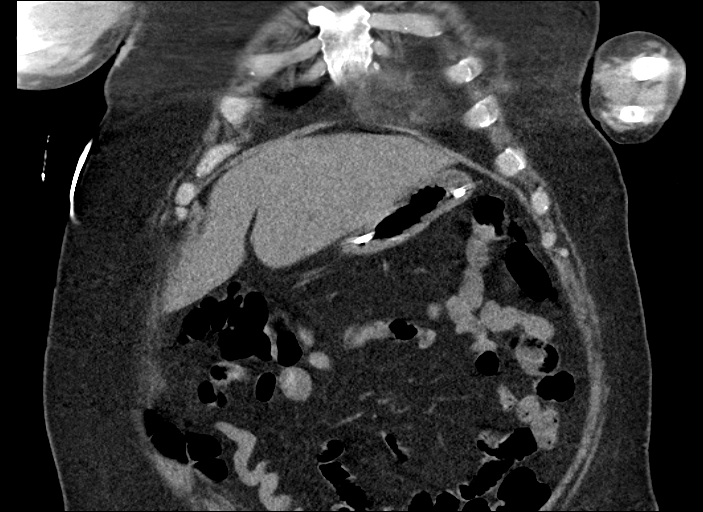
[im 49/110  soft-tissue]
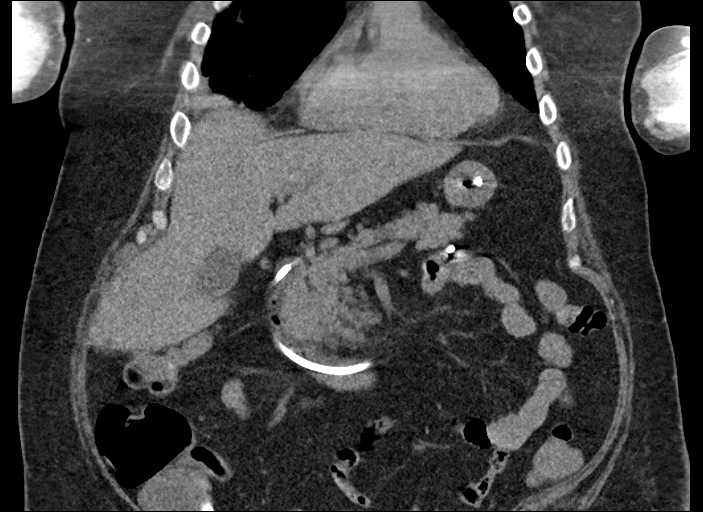
[im 61/110  soft-tissue]
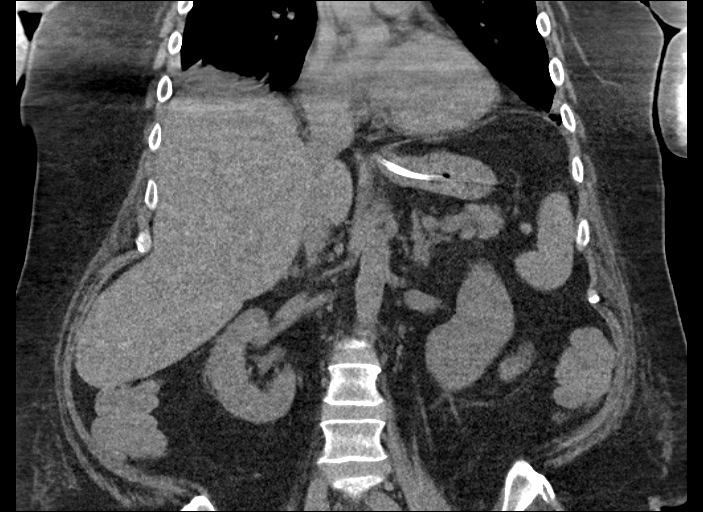

[15 of 46 positions shown; findings below may reference images not displayed]

FINDINGS: The lack of intravenous contrast limits the ability to evaluate
solid abdominal organs.

Lower chest: Limited visualization of the lower thorax demonstrates
small right-sided pleural effusion. Bibasilar consolidative
opacities with associated air bronchograms, right greater than left.

Normal heart size.  No pericardial effusion.

Hepatobiliary: Mild nodularity hepatic contour. Normal noncontrast
appearance of the gallbladder given degree distention. No radiopaque
gallstones. No ascites.

Pancreas: Normal noncontrast appearance of the pancreas.

Spleen: Normal noncontrast appearance of the spleen. Note is made of
a small splenule.

Adrenals/Urinary Tract: Normal noncontrast appearance of the
bilateral kidneys. No evidence of nephrolithiasis. There is a very
minimal amount of grossly symmetric bilateral likely age and body
habitus related perinephric stranding. No urinary obstruction.

Normal noncontrast appearance the bilateral adrenal glands. The
urinary bladder was not imaged.

Stomach/Bowel: The anterior wall the stomach is well apposed against
the ventral wall of the upper abdomen without interposed transverse
colon.

Enteric tube terminates within the DJJ. No evidence of enteric
obstruction. Radiopaque material within the cecum may represent
portions of radiopaque pill fragments. No pneumoperitoneum,
pneumatosis or portal venous gas.

Vascular/Lymphatic: Minimal amount of atherosclerotic plaque within
a normal caliber abdominal aorta.

No bulky within normal caliber abdominal aorta retroperitoneal or
mesenteric lymphadenopathy.

Other: Scattered foci of subcutaneous emphysema about the ventral
wall of the abdomen, likely at the location of subcutaneous
medication administration. Note is made of a narrow neck mesenteric
fat containing periumbilical hernia measuring at least 4.8 x 2.2 cm.
A minimal amount of subcutaneous edema is noted about the bilateral
flanks in midline of the back.

Musculoskeletal: No acute or aggressive osseous abnormalities.
Stigmata of DISH within the thoracic spine.
IMPRESSION: 1. Gastric anatomy amenable to potential percutaneous gastrostomy
tube placement as indicated.
2. Small right-sided pleural effusion with bibasilar consolidative
opacities with associated air bronchograms, right greater than left,
atelectasis versus infiltrate.
3.  Aortic Atherosclerosis ([id number]).

## 2020-12-01 IMAGING — DX PORTABLE CHEST - 1 VIEW
1 series · 1 of 1 positions shown · non-contrast
Comparison: Radiograph September 17, 2018.

CLINICAL DATA: Acute on chronic respiratory failure.

EXAM:
PORTABLE CHEST 1 VIEW

[chest]
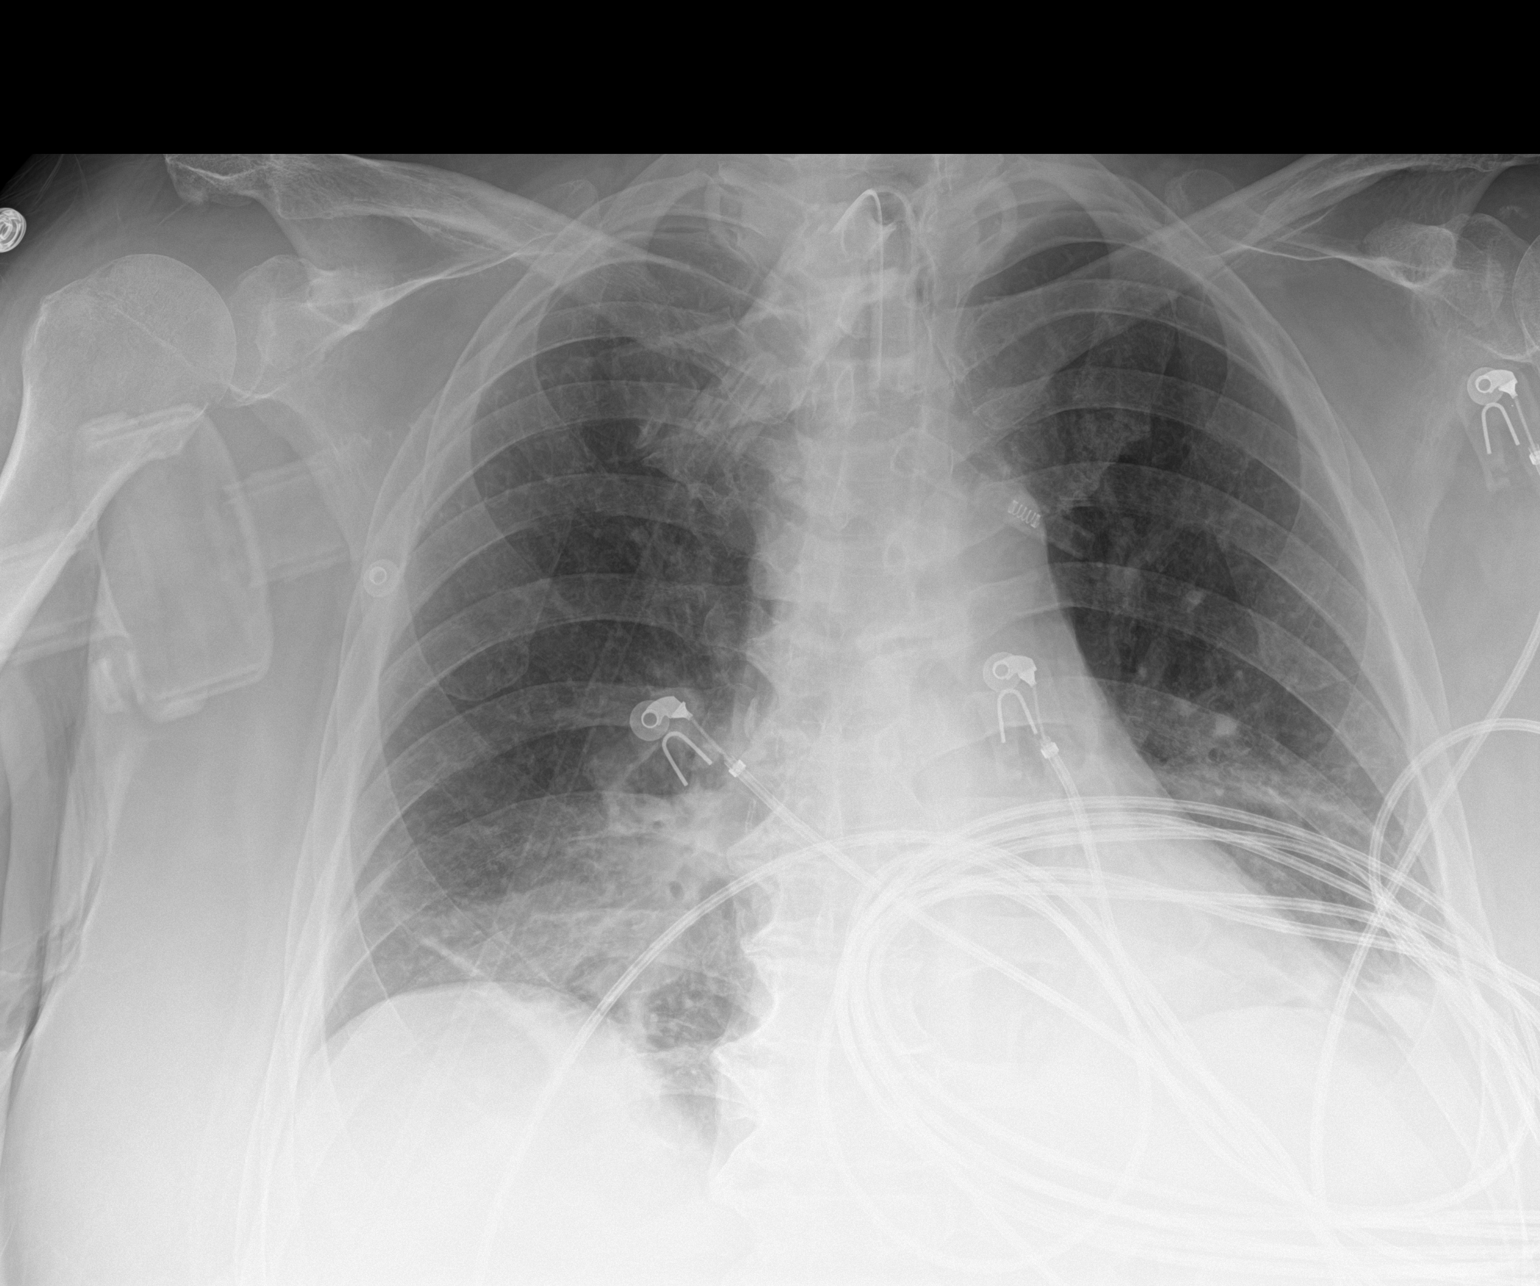

[1 of 1 positions shown; findings below may reference images not displayed]

FINDINGS: Tracheostomy is in good position. Stable cardiomediastinal
silhouette. No pneumothorax is noted. Bibasilar opacities are noted
concerning for atelectasis or infiltrates. Bony thorax is
unremarkable.
IMPRESSION: Mild bibasilar opacities are noted concerning for atelectasis or
pneumonia.

## 2020-12-06 IMAGING — DX PORTABLE CHEST - 1 VIEW
1 series · 1 of 1 positions shown · non-contrast
Comparison: September 30, 2018

CLINICAL DATA: Hypoxia.  Tracheostomy.

EXAM:
PORTABLE CHEST 1 VIEW

[chest ap]
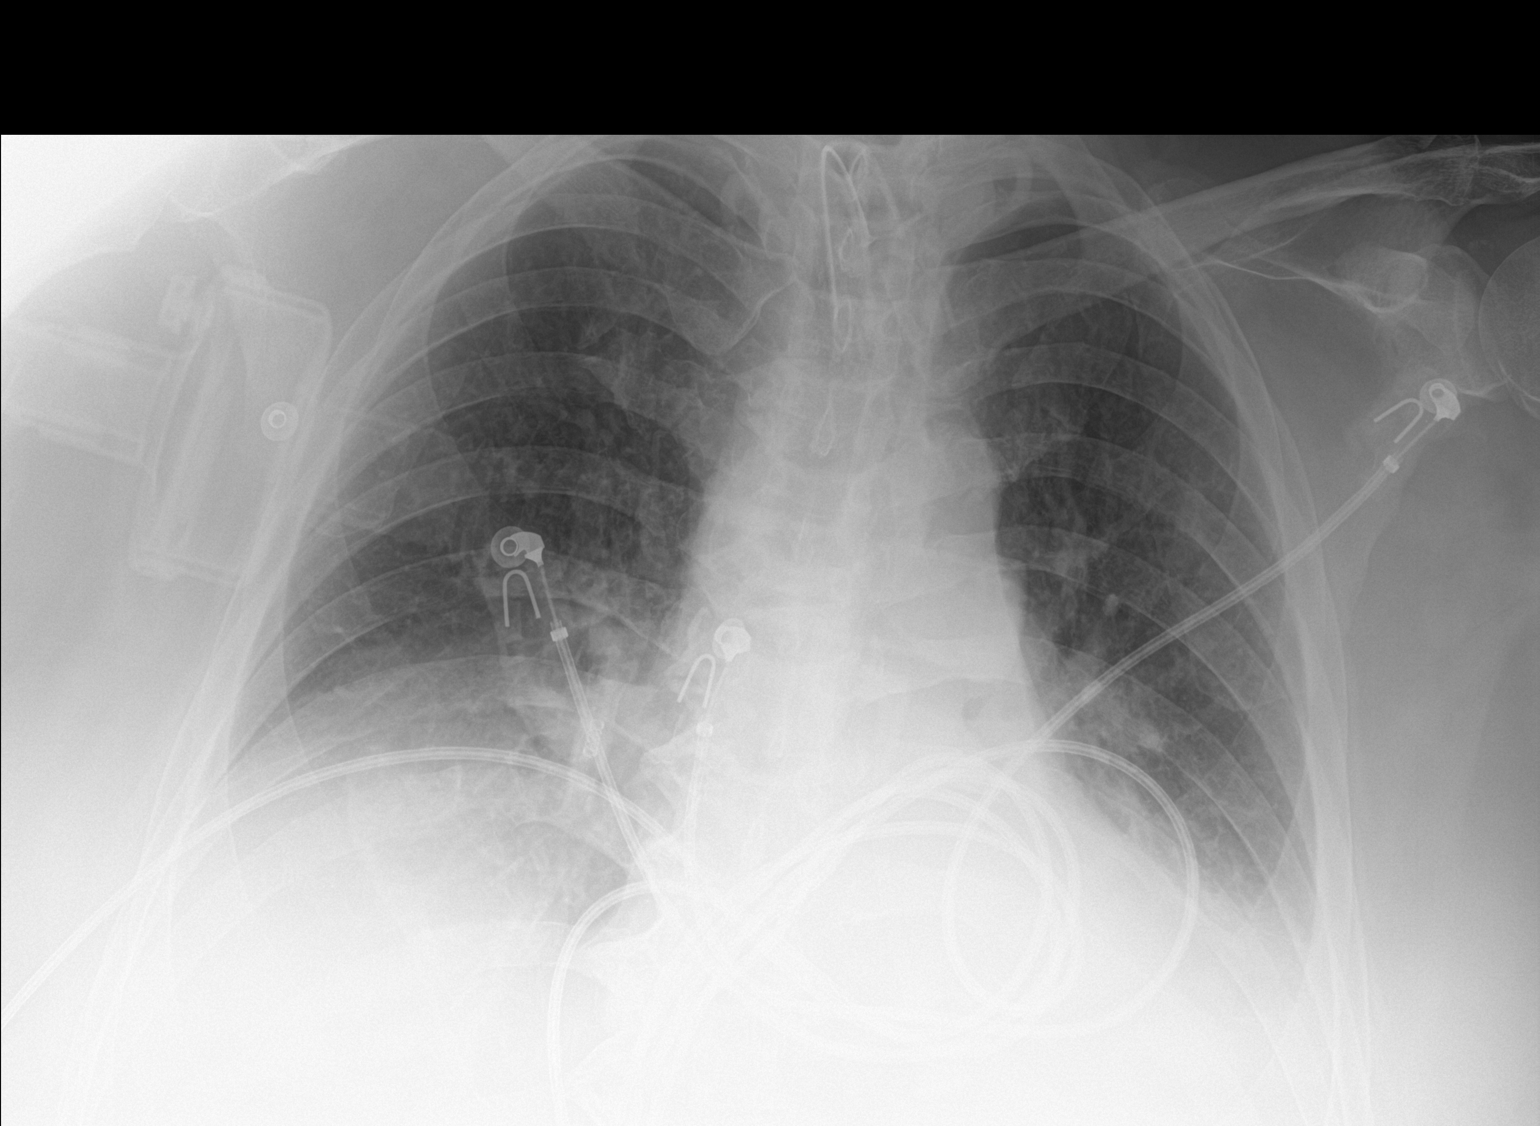

[1 of 1 positions shown; findings below may reference images not displayed]

FINDINGS: The tracheostomy tube is in stable position. No pneumothorax.
Bilateral layering pleural effusions with underlying opacities, more
pronounced in the interval. Cardiomediastinal silhouette is stable.
The lungs are otherwise clear.
IMPRESSION: 1. Small bilateral layering effusions with underlying opacities,
more pronounced in the interval.
2. The more focal opacity in the right base was concerning for
pneumonia on the previous study. This region is obscured on today's
study. I suspect at least some atelectasis underlying the effusions.

## 2020-12-15 IMAGING — DX PORTABLE CHEST - 1 VIEW
1 series · 1 of 1 positions shown · non-contrast
Comparison: 10/05/2018

CLINICAL DATA: Respiratory failure

EXAM:
PORTABLE CHEST 1 VIEW

[chest ap]
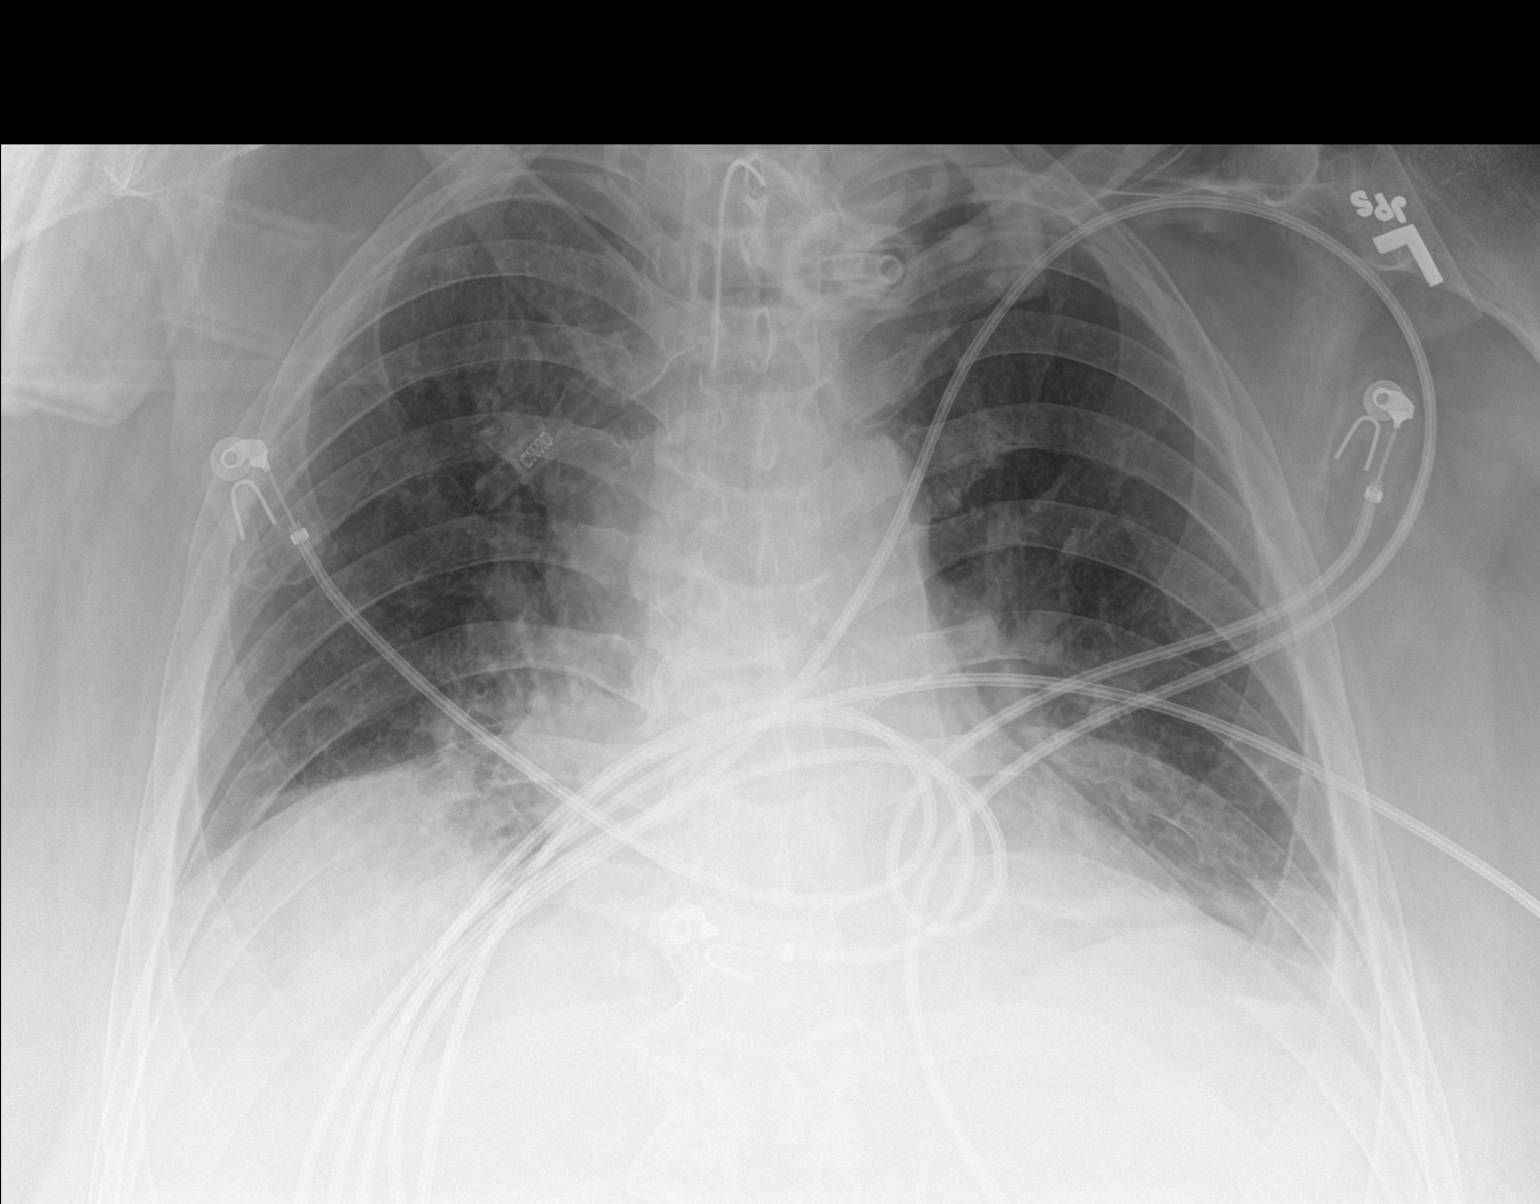

[1 of 1 positions shown; findings below may reference images not displayed]

FINDINGS: Low volume chest with hazy opacity at the bases. Mild improvement at
the left base where the diaphragm is now better visualized. Stable
heart size that is likely normal for technique.

Tracheostomy tube in place.
IMPRESSION: Continued hazy opacity at the bases likely reflecting atelectasis.
Mild improvement in aeration on the left.
